# Patient Record
Sex: Female | Born: 1965 | Race: White | Hispanic: No | Marital: Single | Smoking: Former smoker
Health system: Southern US, Community
[De-identification: ages and names within clinical notes are randomized; demographics above are authoritative.]

## PROBLEM LIST (undated history)

## (undated) DIAGNOSIS — F329 Major depressive disorder, single episode, unspecified: Secondary | ICD-10-CM

## (undated) DIAGNOSIS — K219 Gastro-esophageal reflux disease without esophagitis: Secondary | ICD-10-CM

## (undated) DIAGNOSIS — K861 Other chronic pancreatitis: Secondary | ICD-10-CM

## (undated) DIAGNOSIS — F32A Depression, unspecified: Secondary | ICD-10-CM

## (undated) DIAGNOSIS — Z789 Other specified health status: Secondary | ICD-10-CM

## (undated) HISTORY — PX: BREAST REDUCTION SURGERY: SHX8

## (undated) HISTORY — PX: SHOULDER SURGERY: SHX246

## (undated) HISTORY — DX: Major depressive disorder, single episode, unspecified: F32.9

## (undated) HISTORY — PX: REDUCTION MAMMAPLASTY: SUR839

## (undated) HISTORY — DX: Depression, unspecified: F32.A

## (undated) HISTORY — DX: Gastro-esophageal reflux disease without esophagitis: K21.9

## (undated) HISTORY — DX: Other chronic pancreatitis: K86.1

---

## 2016-01-26 ENCOUNTER — Encounter (HOSPITAL_COMMUNITY): Payer: Self-pay | Admitting: Internal Medicine

## 2016-01-26 ENCOUNTER — Inpatient Hospital Stay (HOSPITAL_COMMUNITY): Payer: Medicaid Other

## 2016-01-26 ENCOUNTER — Encounter: Payer: Self-pay | Admitting: Emergency Medicine

## 2016-01-26 ENCOUNTER — Inpatient Hospital Stay (HOSPITAL_COMMUNITY)
Admission: AD | Admit: 2016-01-26 | Discharge: 2016-01-30 | DRG: 439 | Disposition: A | Discharge status: Home / Self Care | Payer: Medicaid Other | Source: Other Acute Inpatient Hospital | Attending: Internal Medicine | Admitting: Internal Medicine

## 2016-01-26 ENCOUNTER — Emergency Department
Admission: EM | Admit: 2016-01-26 | Discharge: 2016-01-26 | DRG: 439 | Disposition: A | Discharge status: Other Acute Inpatient Hospital | Payer: Medicaid Other | Attending: Internal Medicine | Admitting: Internal Medicine

## 2016-01-26 ENCOUNTER — Emergency Department: Payer: Medicaid Other

## 2016-01-26 DIAGNOSIS — N3090 Cystitis, unspecified without hematuria: Secondary | ICD-10-CM

## 2016-01-26 DIAGNOSIS — K8050 Calculus of bile duct without cholangitis or cholecystitis without obstruction: Secondary | ICD-10-CM | POA: Insufficient documentation

## 2016-01-26 DIAGNOSIS — N3001 Acute cystitis with hematuria: Secondary | ICD-10-CM

## 2016-01-26 DIAGNOSIS — F17210 Nicotine dependence, cigarettes, uncomplicated: Secondary | ICD-10-CM | POA: Diagnosis present

## 2016-01-26 DIAGNOSIS — R7303 Prediabetes: Secondary | ICD-10-CM | POA: Diagnosis present

## 2016-01-26 DIAGNOSIS — K8689 Other specified diseases of pancreas: Secondary | ICD-10-CM

## 2016-01-26 DIAGNOSIS — R739 Hyperglycemia, unspecified: Secondary | ICD-10-CM | POA: Diagnosis present

## 2016-01-26 DIAGNOSIS — K861 Other chronic pancreatitis: Secondary | ICD-10-CM | POA: Diagnosis present

## 2016-01-26 DIAGNOSIS — R112 Nausea with vomiting, unspecified: Secondary | ICD-10-CM | POA: Diagnosis present

## 2016-01-26 DIAGNOSIS — D72829 Elevated white blood cell count, unspecified: Secondary | ICD-10-CM

## 2016-01-26 DIAGNOSIS — K859 Acute pancreatitis without necrosis or infection, unspecified: Principal | ICD-10-CM

## 2016-01-26 DIAGNOSIS — F17200 Nicotine dependence, unspecified, uncomplicated: Secondary | ICD-10-CM | POA: Diagnosis not present

## 2016-01-26 DIAGNOSIS — E872 Acidosis, unspecified: Secondary | ICD-10-CM | POA: Diagnosis present

## 2016-01-26 HISTORY — DX: Other specified health status: Z78.9

## 2016-01-26 LAB — COMPREHENSIVE METABOLIC PANEL
ALT: 16 U/L (ref 14–54)
AST: 14 U/L — ABNORMAL LOW (ref 15–41)
Albumin: 3.9 g/dL (ref 3.5–5.0)
Alkaline Phosphatase: 103 U/L (ref 38–126)
Anion gap: 11 (ref 5–15)
BUN: 16 mg/dL (ref 6–20)
CO2: 20 mmol/L — ABNORMAL LOW (ref 22–32)
Calcium: 9.8 mg/dL (ref 8.9–10.3)
Chloride: 105 mmol/L (ref 101–111)
Creatinine, Ser: 0.47 mg/dL (ref 0.44–1.00)
GFR calc Af Amer: >60 mL/min (ref >60)
GFR calc non Af Amer: >60 mL/min (ref >60)
Glucose, Bld: 122 mg/dL — ABNORMAL HIGH (ref 65–99)
Potassium: 3.5 mmol/L (ref 3.5–5.1)
Sodium: 136 mmol/L (ref 135–145)
Total Bilirubin: 1 mg/dL (ref 0.3–1.2)
Total Protein: 7.5 g/dL (ref 6.5–8.1)

## 2016-01-26 LAB — URINALYSIS COMPLETE WITH MICROSCOPIC (ARMC ONLY)
BILIRUBIN URINE: NEGATIVE
Glucose, UA: NEGATIVE mg/dL
LEUKOCYTES UA: NEGATIVE
Nitrite: POSITIVE — AB
PH: 5 (ref 5.0–8.0)
Protein, ur: 30 mg/dL — AB
SPECIFIC GRAVITY, URINE: 1.019 (ref 1.005–1.030)

## 2016-01-26 LAB — CBC
HCT: 43.2 % (ref 35.0–47.0)
Hemoglobin: 14.9 g/dL (ref 12.0–16.0)
MCH: 31.3 pg (ref 26.0–34.0)
MCHC: 34.4 g/dL (ref 32.0–36.0)
MCV: 90.9 fL (ref 80.0–100.0)
Platelets: 214 10*3/uL (ref 150–440)
RBC: 4.75 MIL/uL (ref 3.80–5.20)
RDW: 12.9 % (ref 11.5–14.5)
WBC: 16.2 10*3/uL — ABNORMAL HIGH (ref 3.6–11.0)

## 2016-01-26 LAB — LIPASE, BLOOD: LIPASE: 780 U/L — AB (ref 11–51)

## 2016-01-26 MED ORDER — DOCUSATE SODIUM 100 MG PO CAPS: 100.0000 mg | ORAL_CAPSULE | Freq: Two times a day (BID) | ORAL | Status: DC

## 2016-01-26 MED ORDER — ENOXAPARIN SODIUM 40 MG/0.4ML ~~LOC~~ SOLN: 40.0000 mg | SUBCUTANEOUS | Status: DC

## 2016-01-26 MED ORDER — ACETAMINOPHEN 325 MG PO TABS: 650.0000 mg | ORAL_TABLET | Freq: Four times a day (QID) | ORAL | Status: DC | PRN

## 2016-01-26 MED ORDER — SODIUM CHLORIDE 0.9 % IV SOLN: INTRAVENOUS | Status: DC

## 2016-01-26 MED ORDER — ONDANSETRON HCL 4 MG/2ML IJ SOLN
4.0000 mg | Freq: Once | INTRAMUSCULAR | Status: AC
  Administered 2016-01-26: 4 mg via INTRAVENOUS

## 2016-01-26 MED ORDER — HYDROCODONE-ACETAMINOPHEN 5-325 MG PO TABS
1.0000 | ORAL_TABLET | ORAL | Status: DC | PRN
  Administered 2016-01-28 – 2016-01-29 (×2): 2 via ORAL
  Filled 2016-01-26 (×4): qty 2

## 2016-01-26 MED ORDER — HYDROMORPHONE HCL 1 MG/ML IJ SOLN
0.5000 mg | INTRAMUSCULAR | Status: DC | PRN
  Administered 2016-01-26 – 2016-01-27 (×2): 0.5 mg via INTRAVENOUS
  Filled 2016-01-26 (×2): qty 1

## 2016-01-26 MED ORDER — DIATRIZOATE MEGLUMINE & SODIUM 66-10 % PO SOLN
ORAL | Status: AC
  Administered 2016-01-26: 30 mL
  Filled 2016-01-26: qty 30

## 2016-01-26 MED ORDER — ACETAMINOPHEN 650 MG RE SUPP: 650.0000 mg | Freq: Four times a day (QID) | RECTAL | Status: DC | PRN

## 2016-01-26 MED ORDER — ONDANSETRON HCL 4 MG/2ML IJ SOLN: 4.0000 mg | Freq: Four times a day (QID) | INTRAMUSCULAR | Status: DC | PRN

## 2016-01-26 MED ORDER — SODIUM CHLORIDE 0.9 % IV SOLN: INTRAVENOUS | Status: AC

## 2016-01-26 MED ORDER — ONDANSETRON HCL 4 MG/2ML IJ SOLN
INTRAMUSCULAR | Status: AC
  Administered 2016-01-26: 4 mg via INTRAVENOUS
  Filled 2016-01-26: qty 2

## 2016-01-26 MED ORDER — ONDANSETRON HCL 4 MG/2ML IJ SOLN
4.0000 mg | Freq: Four times a day (QID) | INTRAMUSCULAR | Status: DC | PRN
  Administered 2016-01-27 – 2016-01-28 (×4): 4 mg via INTRAVENOUS
  Filled 2016-01-26 (×4): qty 2

## 2016-01-26 MED ORDER — HYDROMORPHONE HCL 1 MG/ML IJ SOLN
1.0000 mg | INTRAMUSCULAR | Status: DC | PRN
  Administered 2016-01-26: 1 mg via INTRAVENOUS
  Filled 2016-01-26: qty 1

## 2016-01-26 MED ORDER — DEXTROSE 5 % IV SOLN
1.0000 g | Freq: Once | INTRAVENOUS | Status: AC
  Administered 2016-01-26: 1 g via INTRAVENOUS
  Filled 2016-01-26: qty 10

## 2016-01-26 MED ORDER — ONDANSETRON HCL 4 MG PO TABS
4.0000 mg | ORAL_TABLET | Freq: Four times a day (QID) | ORAL | Status: DC | PRN
  Administered 2016-01-29: 4 mg via ORAL
  Filled 2016-01-26: qty 1

## 2016-01-26 MED ORDER — HYDROMORPHONE HCL 1 MG/ML IJ SOLN
INTRAMUSCULAR | Status: AC
  Administered 2016-01-26: 1 mg via INTRAVENOUS
  Filled 2016-01-26: qty 1

## 2016-01-26 MED ORDER — SODIUM CHLORIDE 0.9 % IV SOLN
INTRAVENOUS | Status: DC
  Administered 2016-01-26: 18:00:00 via INTRAVENOUS

## 2016-01-26 MED ORDER — ONDANSETRON HCL 4 MG PO TABS: 4.0000 mg | ORAL_TABLET | Freq: Four times a day (QID) | ORAL | Status: DC | PRN

## 2016-01-26 MED ORDER — SODIUM CHLORIDE 0.9 % IV BOLUS (SEPSIS)
1000.0000 mL | Freq: Once | INTRAVENOUS | Status: AC
  Administered 2016-01-26: 1000 mL via INTRAVENOUS

## 2016-01-26 MED ORDER — SODIUM CHLORIDE 0.9 % IV BOLUS (SEPSIS): 500.0000 mL | Freq: Once | INTRAVENOUS | Status: DC

## 2016-01-26 MED ORDER — HYDROCODONE-ACETAMINOPHEN 5-325 MG PO TABS: 1.0000 | ORAL_TABLET | ORAL | Status: DC | PRN

## 2016-01-26 MED ORDER — PIPERACILLIN-TAZOBACTAM 3.375 G IVPB
3.3750 g | Freq: Three times a day (TID) | INTRAVENOUS | Status: DC
  Administered 2016-01-26 – 2016-01-30 (×10): 3.375 g via INTRAVENOUS
  Filled 2016-01-26 (×14): qty 50

## 2016-01-26 MED ORDER — MORPHINE SULFATE (PF) 4 MG/ML IV SOLN: 4.0000 mg | INTRAVENOUS | Status: DC | PRN

## 2016-01-26 MED ORDER — HYDROMORPHONE HCL 1 MG/ML IJ SOLN
1.0000 mg | Freq: Once | INTRAMUSCULAR | Status: AC
  Administered 2016-01-26: 1 mg via INTRAVENOUS

## 2016-01-26 NOTE — H&P (Signed)
History and Physical    Anita Poole ZOX:[id number] DOB: [birth year] DOA: 01/26/2016  PCP: No PCP Per Patient The patient reports that she does not have a PCP or a gastroenterologist.  Patient coming from: [facility]  Chief Complaint: Nausea, vomiting, abdominal pain  HPI: Anita Poole is a 50 y.o. woman with active tobacco use but no other pertinent medical history who feels that she was in her baseline state of health until Wednesday evening.  She developed nausea and vomiting after working all day.  She denies any new or unusual exposures.  The emesis was nonbloody.  She subsequently developed epigastric and RUQ pain that eventually radiated into her back.  She initially thought that it was musculoskeletal and related to her active vomiting.  However, the pain intensified over the past 24 hours, refractory to prn Advil, and she ultimately presented to the ED at [facility] for evaluation.  She has not had fever, chills, or sweats.  No loss of consciousness.  She denies EtOH use.  She denies any LUTS.  ED Course: The patient has a leukocytosis, mild acidosis, and an elevated lipase level to 780.  RUQ ultrasound shows a pancreatic duct stone.  Of note, gallbladder appearance is unremarkable.  She also has an abnormal U/A (positive nitrites, many bacteria).  She received IV Rocephin.  She has been transferred to [facility] because she will need GI evaluation.  Review of Systems: As per HPI otherwise 10 point review of systems negative.    Past Medical History  Diagnosis Date  . Medical history non-contributory     Past Surgical History  Procedure Laterality Date  . Shoulder surgery Left   . Cesarean section    . Breast reduction surgery       reports that she has been smoking Cigarettes.  She has a 30 pack-year smoking history. She does not have any smokeless tobacco history on file. She reports that she does not drink alcohol or use illicit drugs.  She is not married.  She has  one son.  She is a Production designer, theatre/television/film at [employer].  No Known Allergies  Family History  Problem Relation Age of Onset  . Diabetes Mother   . Kidney failure Mother   . Hypertension Mother   . Coronary artery disease Mother   . Lung cancer Father      Prior to Admission medications   Medication Sig Start Date End Date Taking? Authorizing Provider  ibuprofen (ADVIL,MOTRIN) 200 MG tablet Take 400 mg by mouth every 6 (six) hours as needed for headache, mild pain or moderate pain.    Historical Provider, MD    Physical Exam: Filed Vitals:   01/26/16 2024  BP: 119/60  Pulse: 64  Temp: 98.5 F (36.9 C)  TempSrc: Oral  Resp: 19  SpO2: 96%      Constitutional: NAD, calm, comfortable Filed Vitals:   01/26/16 2024  BP: 119/60  Pulse: 64  Temp: 98.5 F (36.9 C)  TempSrc: Oral  Resp: 19  SpO2: 96%   Eyes: PERRL, lids and conjunctivae normal ENMT: Mucous membranes are slightly dry. Posterior pharynx clear of any exudate or lesions. Normal dentition.  Neck: normal appearance, supple, no masses Respiratory: clear to auscultation bilaterally, no wheezing, no crackles. Normal respiratory effort. No accessory muscle use.  Cardiovascular: Normal rate, regular rhythm, no murmurs / rubs / gallops. No extremity edema. 2+ pedal pulses. GI: abdomen is soft and compressible.  No distention.  She has epigastric and RUQ tenderness with mild  guarding.    Bowel sounds are hypoactive.   Musculoskeletal:  No joint deformity in upper and lower extremities. Good ROM, no contractures. Normal muscle tone.  Skin: no rashes, warm and dry Neurologic: CN 2-12 grossly intact. Sensation intact, Strength symmetric bilaterally, 5/5  Psychiatric: Normal judgment and insight. Alert and oriented x 3. Normal mood.     Labs on Admission: I have personally reviewed following labs and imaging studies  CBC:  Recent Labs Lab 01/26/16 1253  WBC 16.2*  HGB 14.9  HCT 43.2  MCV 90.9  PLT 214   Basic Metabolic  Panel:  Recent Labs Lab 01/26/16 1253  NA 136  K 3.5  CL 105  CO2 20*  GLUCOSE 122*  BUN 16  CREATININE 0.47  CALCIUM 9.8   GFR: Estimated Creatinine Clearance: 74 mL/min (by C-G formula based on Cr of 0.47). Liver Function Tests:  Recent Labs Lab 01/26/16 1253  AST 14*  ALT 16  ALKPHOS 103  BILITOT 1.0  PROT 7.5  ALBUMIN 3.9    Recent Labs Lab 01/26/16 1253  LIPASE 780*   Urine analysis:    Component Value Date/Time   COLORURINE YELLOW* 01/26/2016 1253   APPEARANCEUR CLEAR* 01/26/2016 1253   LABSPEC 1.019 01/26/2016 1253   PHURINE 5.0 01/26/2016 1253   GLUCOSEU NEGATIVE 01/26/2016 1253   HGBUR 3+* 01/26/2016 1253   BILIRUBINUR NEGATIVE 01/26/2016 1253   KETONESUR 2+* 01/26/2016 1253   PROTEINUR 30* 01/26/2016 1253   NITRITE POSITIVE* 01/26/2016 1253   LEUKOCYTESUR NEGATIVE 01/26/2016 1253   Radiological Exams on Admission: [place] Abdomen Limited Ruq  01/26/2016  CLINICAL DATA:  Right upper quadrant abdominal pain, elevated lipase EXAM: US ABDOMEN LIMITED - RIGHT UPPER QUADRANT COMPARISON:  None. FINDINGS: Gallbladder: No gallstones, gallbladder wall thickening, or pericholecystic fluid. Common bile duct: Diameter: 6 mm Liver: No focal lesion identified. Within normal limits in parenchymal echogenicity. Additional comments: Dilated pancreatic duct, measuring 9 mm, with suspected calculi within the duct measuring up to 8 mm . IMPRESSION: Dilated pancreatic duct with ductal calculi measuring up to 8 mm. Otherwise negative right upper quadrant ultrasound. Electronically Signed   By: Charline BillsSriyesh  Krishnan M.D.   On: 01/26/2016 17:15     Assessment/Plan Principal Problem:   Acute pancreatitis Active Problems:   Acidosis   Leukocytosis   Hyperglycemia   Acute cystitis with hematuria   Choledocholithiasis      Acute pancreatitis with pancreatic duct stones --GI consult pending.  Dr. Matthias HughsBuccini to see the patient tonight and discuss with his colleagues, but the  patient may actually need a tertiary referral center for appropriate intervention. --CT A/P pending --NPO --Aggressive IV fluids resuscitation per discussion with GI --Empiric IV zosyn --Repeat lipase in the AM  UTI, asymptomatic --Already on Empiric Zosyn --Urine culture pending --No signs of sepsis at this point  Active tobacco use --Smoking cessation counseling provided   DVT prophylaxis: SCDs. Early ambulation Code Status: FULL Family Communication: Patient alone at the time of admission. Disposition Plan: She will be here at least two midnights. Consults called: [facility] Admission status: Inpatient, med surg   TIME SPENT: 65 minutes   Jerene Bearsarter,Fowler Antos Harrison MD [facility] Pager [phone]  If 7PM-7AM, please contact night-coverage [email or web address] Password [email or web address]  01/26/2016, 9:09 PM

## 2016-01-26 NOTE — ED Notes (Signed)
Attempted to call report prior to transport, but [facility]. This RN gave [facility]  ED ascom number for call-back.

## 2016-01-26 NOTE — ED Provider Notes (Signed)
[facility] Emergency Department Provider Note  ____________________________________________  Time seen: 2:55 PM  I have reviewed the triage vital signs and the nursing notes.   HISTORY  Chief Complaint Emesis    HPI Anita Poole is a 50 y.o. female who complains of severe epigastric pain worse with eating or drinking any fluids for the past 2 days. No fever chills or sweats. No chest pain or shortness of breath. Never had anything like this before. Gallstones. Does not drink. No recent steroids, no trauma. Pain is severe sharp radiates to the back. No alleviating factors. Not positional. Constant, waxing and waning over the past 2 days.     History reviewed. No pertinent past medical history.   Patient Active Problem List   Diagnosis Date Noted  . Acute pancreatitis 01/26/2016  . Acidosis 01/26/2016  . Leukocytosis 01/26/2016  . Hyperglycemia 01/26/2016  . Acute cystitis with hematuria 01/26/2016     History reviewed. No pertinent past surgical history.   Current Outpatient Rx  Name  Route  Sig  Dispense  Refill  . ibuprofen (ADVIL,MOTRIN) 200 MG tablet   Oral   Take 400 mg by mouth every 6 (six) hours as needed for headache, mild pain or moderate pain.            Allergies Review of patient's allergies indicates no known allergies.   History reviewed. No pertinent family history.  Social History Social History  Substance Use Topics  . Smoking status: Current Every Day Smoker  . Smokeless tobacco: None  . Alcohol Use: No    Review of Systems  Constitutional:   No fever or chills.  ENT:   No sore throat. No rhinorrhea. Cardiovascular:   No chest pain. Respiratory:   No dyspnea or cough. Gastrointestinal:   Positive abdominal pain with vomiting..  Genitourinary:   Positive dysuria. Musculoskeletal:   Negative for focal pain or swelling Neurological:   Negative for headaches 10-point ROS otherwise  negative.  ____________________________________________   PHYSICAL EXAM:  VITAL SIGNS: ED Triage Vitals  Enc Vitals Group     BP 01/26/16 1303 130/74 mmHg     Pulse Rate 01/26/16 1303 82     Resp 01/26/16 1303 22     Temp --      Temp src --      SpO2 01/26/16 1303 99 %     Weight 01/26/16 1303 150 lb (68.04 kg)     Height 01/26/16 1303  (1.651 m)     Head Cir --      Peak Flow --      Pain Score 01/26/16 1245 6     Pain Loc --      Pain Edu? --      Excl. in GC? --     Vital signs reviewed, nursing assessments reviewed.   Constitutional:   Alert and oriented. Uncomfortable. Eyes:   No scleral icterus. No conjunctival pallor. PERRL. EOMI.  No nystagmus. ENT   Head:   Normocephalic and atraumatic.   Nose:   No congestion/rhinnorhea. No septal hematoma   Mouth/Throat:   Dry mucous membranes, no pharyngeal erythema. No peritonsillar mass.    Neck:   No stridor. No SubQ emphysema. No meningismus. Hematological/Lymphatic/Immunilogical:   No cervical lymphadenopathy. Cardiovascular:   RRR. Symmetric bilateral radial and DP pulses.  No murmurs.  Respiratory:   Normal respiratory effort without tachypnea nor retractions. Breath sounds are clear and equal bilaterally. No wheezes/rales/rhonchi. Gastrointestinal:   Soft with severe  epigastric tenderness and mild right upper quadrant tenderness. Negative Murphy. Positive suprapubic tenderness. Non distended. There is no CVA tenderness.  No rebound, rigidity, or guarding. Genitourinary:   deferred Musculoskeletal:   Nontender with normal range of motion in all extremities. No joint effusions.  No lower extremity tenderness.  No edema. Neurologic:   Normal speech and language.  CN 2-10 normal. Motor grossly intact. No gross focal neurologic deficits are appreciated.  Skin:    Skin is warm, dry and intact. No rash noted.  No petechiae, purpura, or bullae.  ____________________________________________    LABS  (pertinent positives/negatives) (all labs ordered are listed, but only abnormal results are displayed) Labs Reviewed  LIPASE, BLOOD - Abnormal; Notable for the following:    Lipase 780 (*)    All other components within normal limits  COMPREHENSIVE METABOLIC PANEL - Abnormal; Notable for the following:    CO2 20 (*)    Glucose, Bld 122 (*)    AST 14 (*)    All other components within normal limits  CBC - Abnormal; Notable for the following:    WBC 16.2 (*)    All other components within normal limits  URINALYSIS COMPLETEWITH MICROSCOPIC ([facility] ONLY) - Abnormal; Notable for the following:    Color, Urine YELLOW (*)    APPearance CLEAR (*)    Ketones, ur 2+ (*)    Hgb urine dipstick 3+ (*)    Protein, ur 30 (*)    Nitrite POSITIVE (*)    Bacteria, UA MANY (*)    Squamous Epithelial / LPF 0-5 (*)    All other components within normal limits  URINE CULTURE   ____________________________________________   EKG    ____________________________________________    RADIOLOGY  Ultrasound right upper quadrant reveals dilated pancreatic duct with notable ductal calculi measuring up to 8 mm. No evidence of cholecystitis.  ____________________________________________   PROCEDURES CRITICAL CARE Performed by: Scotty CourtSTAFFORD, Leon Goodnow   Total critical care time: 35 minutes  Critical care time was exclusive of separately billable procedures and treating other patients.  Critical care was necessary to treat or prevent imminent or life-threatening deterioration.  Critical care was time spent personally by me on the following activities: development of treatment plan with patient and/or surrogate as well as nursing, discussions with consultants, evaluation of patient's response to treatment, examination of patient, obtaining history from patient or surrogate, ordering and performing treatments and interventions, ordering and review of laboratory studies, ordering and review of radiographic  studies, pulse oximetry and re-evaluation of patient's condition.   ____________________________________________   INITIAL IMPRESSION / ASSESSMENT AND PLAN / ED COURSE  Pertinent labs & imaging results that were available during my care of the patient were reviewed by me and considered in my medical decision making (see chart for details).  Patient presents with severe epigastric pain with tenderness. Lipase is elevated at 760. Ultrasound reveals pancreatic ductal calculi without cholecystitis. Discussed with gastroenterology on-call Dr. Mechele CollinElliott who reports that this will require advanced ERCP beyond the capability of this facility. Recommends transfer to Medical Center. No evidence of cholecystitis or cholangitis at this time, ceftriaxone given for cystitis.     ____________________________________________   FINAL CLINICAL IMPRESSION(S) / ED DIAGNOSES  Final diagnoses:  Acute pancreatitis, unspecified pancreatitis type  Cystitis       Portions of this note were generated with dragon dictation software. Dictation errors may occur despite best attempts at proofreading.   Sharman CheekPhillip Shaquia Berkley, MD 01/26/16 2031

## 2016-01-26 NOTE — Progress Notes (Signed)
Pharmacy Antibiotic Note  Anita Poole is a 50 y.o. female admitted on 01/26/2016 with pancreatitis.  Pharmacy has been consulted for Zosyn dosing.  WBC 16, afebrile, Cr 0.47  Plan: Zosyn 3.375GM q8h EI     Temp (24hrs), Avg:98.5 F (36.9 C), Min:98.5 F (36.9 C), Max:98.5 F (36.9 C)   Recent Labs Lab 01/26/16 1253  WBC 16.2*  CREATININE 0.47    Estimated Creatinine Clearance: 74 mL/min (by C-G formula based on Cr of 0.47).    No Known Allergies  Anita Poole Pharm.D. CPP, BCPS Clinical Pharmacist [phone] 01/26/2016 9:25 PM   c

## 2016-01-26 NOTE — ED Notes (Signed)
Patient transported to Ultrasound 

## 2016-01-26 NOTE — ED Notes (Signed)
Report given to Gerda DissFaye RN at [phone] at [facility].

## 2016-01-26 NOTE — Plan of Care (Signed)
Called by [facility] for Ms Mazbough  [age]year old female with acute pancreatitis and UTI. Hemodynamically stable. Lipase 780 US abd showed dilated pancreatic duct with ductal calculi. Will need ERCP, [facility].   Accepted patient to med surg. Will need GI consult on arrival.    RAI,RIPUDEEP M.D. [facility] 01/26/2016, 5:53 PM  Pager: [phone]   .

## 2016-01-26 NOTE — Consult Note (Signed)
[facility] [place] [facility]   PATIENT NAME: Anita Poole    MR#:  [id number]  DATE OF BIRTH:  September 02, 1965  DATE OF ADMISSION:  01/26/2016  PRIMARY CARE PHYSICIAN: No PCP Per Patient   REQUESTING/REFERRING PHYSICIAN: Dr. Scotty CourtStafford  CHIEF COMPLAINT:   Chief Complaint  Patient presents with  . Emesis    HISTORY OF PRESENT ILLNESS:  Anita Poole  is a 50 y.o. female with no significant past medical history who presents to the hospital with complaints of nausea, vomiting, upper abdominal pain. Patient tells me that she was feeling well up until Wednesday, 3 days ago when she started having upper abdominal pain and frequent nausea and vomiting. Patient's pain was described as intermittent, sharp, as well as achy coming in paroxysms. It was to come as high as 10 out of 10 by intensity that, but then it would improve to 3 out of 10 by intensity. It was come every 15-20 minutes, patient felt chilly, she lost approximately 6 pounds since about a week ago because of relentless nausea and vomiting and severe abdominal pain. She presented emergency room for further evaluation and treatment. In emergency room, she was noted to have elevated lipase. Ultrasound of right upper quadrant revealed dilated pancreatic duct with ductal calculi measuring up to 8 mm. Consultation from a primary doc was requested by emergency room physician.   PAST MEDICAL HISTORY:  History reviewed. No pertinent past medical history.  PAST SURGICAL HISTOIRY:  Breast reduction, C-section, left arm surgery with aplate and screws due to fracture.  SOCIAL HISTORY:   Social History  Substance Use Topics  . Smoking status: Current Every Day Smoker  . Smokeless tobacco: Not on file  . Alcohol Use: No    FAMILY HISTORY:  Family history significant for patient's father was died of lung cancer, he was smoker, mother had hypertension, diabetes DRUG ALLERGIES:  No Known Allergies  REVIEW OF  SYSTEMS:  CONSTITUTIONAL: No fever, fatigue or weakness.  EYES: No blurred or double vision.  EARS, NOSE, AND THROAT: No tinnitus or ear pain.  RESPIRATORY: No cough, shortness of breath, wheezing or hemoptysis.  CARDIOVASCULAR: No chest pain, orthopnea, edema.  GASTROINTESTINAL: Frequent nausea, vomiting, upper abdominal pain, mostly in the right upper quadrant, no hematemesis or hematochezia.  GENITOURINARY: No dysuria, hematuria.  ENDOCRINE: No polyuria, nocturia,  HEMATOLOGY: No anemia, easy bruising or bleeding SKIN: No rash or lesion. MUSCULOSKELETAL: No joint pain or arthritis.   NEUROLOGIC: No tingling, numbness, weakness.  PSYCHIATRY: No anxiety or depression.   MEDICATIONS AT HOME:   Prior to Admission medications   Medication Sig Start Date End Date Taking? Authorizing Provider  ibuprofen (ADVIL,MOTRIN) 200 MG tablet Take 400 mg by mouth every 6 (six) hours as needed for headache, mild pain or moderate pain.   Yes Historical Provider, MD      VITAL SIGNS:  Blood pressure 118/66, pulse 70, resp. rate 20, height 5\' 5"  (1.651 m), weight 68.04 kg (150 lb), SpO2 94 %.  PHYSICAL EXAMINATION:  GENERAL:  50 y.o.-year-old patient lying in the bed In moderate distress due to significant abdominal pain, grimacing, uncomfortable, restless in bed.  EYES: Pupils equal, round, reactive to light and accommodation. No scleral icterus. Extraocular muscles intact.  HEENT: Head atraumatic, normocephalic. Oropharynx and nasopharynx clear.  NECK:  Supple, no jugular venous distention. No thyroid enlargement, no tenderness.  LUNGS: Normal breath sounds bilaterally, no wheezing, rales,rhonchi or crepitation. No use of accessory muscles of respiration.  CARDIOVASCULAR: S1, S2 normal. No murmurs, rubs, or gallops.  ABDOMEN: Soft, diffusely tender mostly in right upper quadrant ,  no rebound, but voluntary guarding was noted in the upper abdomen, nondistended. Bowel sounds present, diminished  No  organomegaly or mass.  EXTREMITIES: No pedal edema, cyanosis, or clubbing.  NEUROLOGIC: Cranial nerves II through XII are intact. Muscle strength 5/5 in all extremities. Sensation intact. Gait not checked.  PSYCHIATRIC: The patient is alert and oriented x 3.  SKIN: No obvious rash, lesion, or ulcer.   LABORATORY PANEL:   CBC  Recent Labs Lab 01/26/16 1253  WBC 16.2*  HGB 14.9  HCT 43.2  PLT 214   ------------------------------------------------------------------------------------------------------------------  Chemistries   Recent Labs Lab 01/26/16 1253  NA 136  K 3.5  CL 105  CO2 20*  GLUCOSE 122*  BUN 16  CREATININE 0.47  CALCIUM 9.8  AST 14*  ALT 16  ALKPHOS 103  BILITOT 1.0   ------------------------------------------------------------------------------------------------------------------  Cardiac Enzymes No results for input(s): TROPONINI in the last 168 hours. ------------------------------------------------------------------------------------------------------------------  RADIOLOGY:  US Abdomen Limited Ruq  01/26/2016  CLINICAL DATA:  Right upper quadrant abdominal pain, elevated lipase EXAM: US ABDOMEN LIMITED - RIGHT UPPER QUADRANT COMPARISON:  None. FINDINGS: Gallbladder: No gallstones, gallbladder wall thickening, or pericholecystic fluid. Common bile duct: Diameter: 6 mm Liver: No focal lesion identified. Within normal limits in parenchymal echogenicity. Additional comments: Dilated pancreatic duct, measuring 9 mm, with suspected calculi within the duct measuring up to 8 mm . IMPRESSION: Dilated pancreatic duct with ductal calculi measuring up to 8 mm. Otherwise negative right upper quadrant ultrasound. Electronically Signed   By: Charline Bills M.D.   On: 01/26/2016 17:15    EKG:  No orders found for this or any previous visit.  IMPRESSION AND PLAN:    Principal Problem:   Acute pancreatitis Active Problems:   Acidosis   Acute cystitis with  hematuria   Leukocytosis   Hyperglycemia  #1. Acute gallstone pancreatitis, continue patient on IV fluids, pain medications, patient would benefit from tertiary care center evaluation for possible ERCP, unfortunately, unable to admit patient, due to absence of gastroenterologist who will perform ERCP in the nearest future.  #2. Acidosis, likely due to intravascular depletion. Continue IV fluids   #3. Acute cystitis with hematuria, patient is asymptomatic, get urinary cultures, initiate antibiotic therapy if cultures are positive, patient was given 1 dose of Rocephin in the emergency room #4. Leukocytosis, follow with therapy #5. Hyperglycemia, get hemoglobin A1c to rule out diabetes   #6. Tobacco abuse. Counseling, discussed this patient for approximately 3 minutes. Nicotine replacement therapy is going to be initiated while in the hospital      All the records are reviewed and case discussed with Consulting provider. Management plans discussed with the patient, family and they are in agreement.  CODE STAFULL CODE  TOTAL TIME TAKING CARE OF THIS PATIENT 50 minutes.    Katharina Caper M.D on 01/26/2016 at 5:22 PM  Between 7am to 6pm - Pager - [phone]  After 6pm go to [email or web address] - password [email or web address]  [facility]  Office  [phone]  CC: Primary care Physician: No PCP Per Patient

## 2016-01-26 NOTE — ED Notes (Signed)
Patient left via [facility]. 

## 2016-01-26 NOTE — Consult Note (Signed)
Referring Provider:   Dr. Michael Litter  Primary Care Physician:  No PCP Per Patient Primary Gastroenterologist:  None (unassigned) (transfer from [facility])  Reason for Consultation:  Pancreatitis, pancreatic ductal stones  HPI: Anita Poole is a 50 y.o. female with no past medical history of any significance, and specifically no GI history, nor any history of pancreatic problems, pancreatitis, or ethanol abuse, who was transferred from the [facility], where ostensibly no GI coverage is available, for management of pancreatitis.  The patient indicates that she had the abrupt onset of upper abdominal pain 3 days ago (Wednesday evening) after eating a fatty meal (ribs). Since then, the pain has intensified, and then plateaued to its current status. She has gotten good relief of the pain with narcotics both at [facility] and here. She has been unable to eat for the past several days; even drinking water would come right back up, although it doesn't sound as though she has had frank nausea and vomiting. The pain is localized primarily to the upper abdominal region and slightly to the right upper quadrant, and does radiate through to the back. No fevers, no chills, no brown urine.  An abdominal ultrasound at [facility] showed no gallstones or biliary ductal dilatation, but did show a dilated (9 mm) pancreatic duct with stones within it.  Labs are pertinent for white count of 16,200 with hemoglobin 14.9, lipase 780, liver chemistries normal, renal function normal.  The patient denies prodromal anorexia, weight loss, or abdominal pain.  The patient moved down here from [place] about 2 years ago. She does not have a regular physician or a primary gastroenterologist. She has not had a screening colonoscopy.   Past Medical History  Diagnosis Date  . Medical history non-contributory     Past Surgical History  Procedure Laterality Date  . Shoulder  surgery Left   . Cesarean section    . Breast reduction surgery      Prior to Admission medications   Medication Sig Start Date End Date Taking? Authorizing Provider  ibuprofen (ADVIL,MOTRIN) 200 MG tablet Take 400 mg by mouth every 6 (six) hours as needed for headache, mild pain or moderate pain.    Historical Provider, MD    Current Facility-Administered Medications  Medication Dose Route Frequency Provider Last Rate Last Dose  . 0.9 %  sodium chloride infusion   Intravenous Continuous Michael Litter, MD       Followed by  . [START ON 01/27/2016] 0.9 %  sodium chloride infusion   Intravenous Continuous Michael Litter, MD      . acetaminophen (TYLENOL) tablet 650 mg  650 mg Oral Q6H PRN Michael Litter, MD       Or  . acetaminophen (TYLENOL) suppository 650 mg  650 mg Rectal Q6H PRN Michael Litter, MD      . HYDROcodone-acetaminophen (NORCO/VICODIN) 5-325 MG per tablet 1-2 tablet  1-2 tablet Oral Q4H PRN Michael Litter, MD      . HYDROmorphone (DILAUDID) injection 0.5 mg  0.5 mg Intravenous Q3H PRN Michael Litter, MD   0.5 mg at 01/26/16 2057  . ondansetron (ZOFRAN) tablet 4 mg  4 mg Oral Q6H PRN Michael Litter, MD       Or  . ondansetron [facility]) injection 4 mg  4 mg Intravenous Q6H PRN Michael Litter, MD      . piperacillin-tazobactam (ZOSYN) IVPB 3.375 g  3.375 g Intravenous Q8H Michael Litter, MD      . sodium chloride 0.9 %  bolus 500 mL  500 mL Intravenous Once Michael LitterNikki Carter, MD        Allergies as of 01/26/2016  . (No Known Allergies)    Family History  Problem Relation Age of Onset  . Diabetes Mother   . Kidney failure Mother   . Hypertension Mother   . Coronary artery disease Mother   . Lung cancer Father     Social History   Social History  . Marital Status: Single    Spouse Name: N/A  . Number of Children: N/A  . Years of Education: N/A   Occupational History  . Not on file.   Social History Main Topics  . Smoking status: Current Every Day Smoker -- 1.00 packs/day for 30  years    Types: Cigarettes  . Smokeless tobacco: Not on file  . Alcohol Use: No  . Drug Use: No  . Sexual Activity: No   Other Topics Concern  . Not on file   Social History Narrative    Review of Systems: Occasional headaches, otherwise negative. No prodromal anorexia, weight loss, or abdominal pain. No chest pain, no cough or shortness of breath. No urinary symptoms. No lymph node enlargement or arthritic complaints.  Physical Exam: Vital signs in last 24 hours: Temp:  [98.5 F (36.9 C)] 98.5 F (36.9 C) (07/08 2024) Pulse Rate:  [64-82] 64 (07/08 2024) Resp:  [16-22] 19 (07/08 2024) BP: (105-138)/(60-89) 119/60 mmHg (07/08 2024) SpO2:  [94 %-100 %] 96 % (07/08 2024) Weight:  [68.04 kg (150 lb)] 68.04 kg (150 lb) (07/08 1303)   General:   Alert,  Well-developed, well-nourished, pleasant and cooperative in remarkably NAD Head:  Normocephalic and atraumatic. Eyes:  Sclera clear, no icterus.   Conjunctiva pink. Mouth:   No ulcerations or lesions.  Oropharynx pink & moist. Neck:   No masses or thyromegaly. Lungs:  Clear throughout to auscultation.   No wheezes, crackles, or rhonchi. No evident respiratory distress. Heart:   Regular rate and rhythm; no murmurs, clicks, rubs,  or gallops. Abdomen:   Nondistended, sparse bowel sounds are present. Moderate upper abdominal tenderness, without rigidity or rebound or severe guarding. Msk:   Symmetrical without gross deformities. Extremities:   Without clubbing, cyanosis, or edema. Neurologic:  Alert and coherent;  grossly normal neurologically. Skin:  Intact without significant lesions or rashes. Cervical Nodes:  No significant cervical adenopathy. Psych:   Alert and cooperative. Normal mood and affect.  Intake/Output from previous day:   Intake/Output this shift:    Lab Results:  Recent Labs  01/26/16 1253  WBC 16.2*  HGB 14.9  HCT 43.2  PLT 214   BMET  Recent Labs  01/26/16 1253  NA 136  K 3.5  CL 105  CO2 20*   GLUCOSE 122*  BUN 16  CREATININE 0.47  CALCIUM 9.8   LFT  Recent Labs  01/26/16 1253  PROT 7.5  ALBUMIN 3.9  AST 14*  ALT 16  ALKPHOS 103  BILITOT 1.0   PT/INR No results for input(s): LABPROT, INR in the last 72 hours.  Studies/Results: [place] Abdomen Limited Ruq  01/26/2016  CLINICAL DATA:  Right upper quadrant abdominal pain, elevated lipase EXAM: US ABDOMEN LIMITED - RIGHT UPPER QUADRANT COMPARISON:  None. FINDINGS: Gallbladder: No gallstones, gallbladder wall thickening, or pericholecystic fluid. Common bile duct: Diameter: 6 mm Liver: No focal lesion identified. Within normal limits in parenchymal echogenicity. Additional comments: Dilated pancreatic duct, measuring 9 mm, with suspected calculi within the duct measuring up to  8 mm . IMPRESSION: Dilated pancreatic duct with ductal calculi measuring up to 8 mm. Otherwise negative right upper quadrant ultrasound. Electronically Signed   By: Charline Bills M.D.   On: 01/26/2016 17:15    Impression: 1. Acute pancreatitis by clinical and biochemical parameters 2. Ultrasonographic evidence of pancreatic ductal dilatation and pancreatic ductal stones. This would imply a fairly high likelihood of a pancreatic ductal stricture, the most common cause of which would be chronic pancreatitis, for which this patient has neither risk factors nor appropriate clinical history. 3. No evidence of cholelithiasis or elevated liver chemistries to suggest gallstone pancreatitis. 4. Candidate for colon cancer screening, slightly overdue  Plan: 1. For tonight, symptomatic management with aggressive IV fluids (300 ML's per hour), pain medication, nothing by mouth. 2. Agree with plan for CT scan tonight to further characterize the pancreas. 3. Reassess tomorrow. If the patient is getting worse clinically or biochemically, consider emergent pancreatic stent placement; otherwise, consider elective referral to a tertiary care Medical Center for more  advanced endoscopic interventions on the pancreas, which might include pancreatic sphincterotomy, stone extraction with, if necessary, extracorporeal shockwave lithotripsy of the pancreatic ductal stones, and stent placement. 4. The above plan has been discussed both with the patient and the attending hospitalist physician.   LOS: 0 days   Jairus Tonne V  01/26/2016, 9:57 PM   Pager [phone] If no answer or after 5 PM call [phone]

## 2016-01-26 NOTE — ED Notes (Signed)
Pt to ed with c/o vomiting since Wednesday night.  Pt denies diarrhea.

## 2016-01-26 NOTE — ED Notes (Signed)
Awaiting CMP and lipase. CBC and UA reviewed. WBC 16.9. Awaiting room for MD eval.

## 2016-01-27 ENCOUNTER — Inpatient Hospital Stay (HOSPITAL_COMMUNITY): Payer: Medicaid Other

## 2016-01-27 DIAGNOSIS — K8500 Idiopathic acute pancreatitis without necrosis or infection: Secondary | ICD-10-CM

## 2016-01-27 DIAGNOSIS — R739 Hyperglycemia, unspecified: Secondary | ICD-10-CM

## 2016-01-27 LAB — CBC
HCT: 36.9 % (ref 36.0–46.0)
Hemoglobin: 12.2 g/dL (ref 12.0–15.0)
MCH: 31.1 pg (ref 26.0–34.0)
MCHC: 33.1 g/dL (ref 30.0–36.0)
MCV: 94.1 fL (ref 78.0–100.0)
PLATELETS: 177 10*3/uL (ref 150–400)
RBC: 3.92 MIL/uL (ref 3.87–5.11)
RDW: 12.7 % (ref 11.5–15.5)
WBC: 12.6 10*3/uL — ABNORMAL HIGH (ref 4.0–10.5)

## 2016-01-27 LAB — APTT: APTT: 30 s (ref 24–37)

## 2016-01-27 LAB — COMPREHENSIVE METABOLIC PANEL
ALK PHOS: 82 U/L (ref 38–126)
ALT: 12 U/L — ABNORMAL LOW (ref 14–54)
ANION GAP: 6 (ref 5–15)
AST: 11 U/L — ABNORMAL LOW (ref 15–41)
Albumin: 2.8 g/dL — ABNORMAL LOW (ref 3.5–5.0)
BUN: 14 mg/dL (ref 6–20)
CALCIUM: 9.3 mg/dL (ref 8.9–10.3)
CHLORIDE: 107 mmol/L (ref 101–111)
CO2: 22 mmol/L (ref 22–32)
Creatinine, Ser: 0.58 mg/dL (ref 0.44–1.00)
GFR calc non Af Amer: >60 mL/min (ref >60)
Glucose, Bld: 94 mg/dL (ref 65–99)
Potassium: 3.3 mmol/L — ABNORMAL LOW (ref 3.5–5.1)
SODIUM: 135 mmol/L (ref 135–145)
Total Bilirubin: 0.7 mg/dL (ref 0.3–1.2)
Total Protein: 5.8 g/dL — ABNORMAL LOW (ref 6.5–8.1)

## 2016-01-27 LAB — LIPASE, BLOOD: Lipase: 337 U/L — ABNORMAL HIGH (ref 11–51)

## 2016-01-27 LAB — PROTIME-INR
INR: 1.26 (ref 0.00–1.49)
PROTHROMBIN TIME: 15.9 s — AB (ref 11.6–15.2)

## 2016-01-27 MED ORDER — KETOROLAC TROMETHAMINE 30 MG/ML IJ SOLN
30.0000 mg | Freq: Once | INTRAMUSCULAR | Status: AC
  Administered 2016-01-27: 30 mg via INTRAVENOUS
  Filled 2016-01-27: qty 1

## 2016-01-27 MED ORDER — POTASSIUM CHLORIDE IN NACL 40-0.9 MEQ/L-% IV SOLN
INTRAVENOUS | Status: DC
  Administered 2016-01-27 – 2016-01-28 (×4): 100 mL/h via INTRAVENOUS
  Filled 2016-01-27 (×12): qty 1000

## 2016-01-27 MED ORDER — NICOTINE 21 MG/24HR TD PT24
21.0000 mg | MEDICATED_PATCH | Freq: Every day | TRANSDERMAL | Status: DC
  Administered 2016-01-27 – 2016-01-30 (×4): 21 mg via TRANSDERMAL
  Filled 2016-01-27 (×4): qty 1

## 2016-01-27 MED ORDER — HYDROMORPHONE HCL 1 MG/ML IJ SOLN
1.0000 mg | INTRAMUSCULAR | Status: DC | PRN
Start: 2016-01-27 — End: 2016-01-30
  Administered 2016-01-27 – 2016-01-29 (×10): 1 mg via INTRAVENOUS
  Filled 2016-01-27 (×10): qty 1

## 2016-01-27 MED ORDER — IOPAMIDOL (ISOVUE-300) INJECTION 61%
INTRAVENOUS | Status: AC
  Administered 2016-01-27: 100 mL
  Filled 2016-01-27: qty 100

## 2016-01-27 NOTE — Discharge Summary (Addendum)
Physician Discharge Summary  Anita Poole MRN: [id number] DOB/AGE: 1966-02-16 50 y.o.  PCP: No PCP Per Patient   Admit date: 01/26/2016 Discharge date: 01/27/2016  Discharge Diagnoses:   Principal Problem:   Acute pancreatitis Active Problems:   Acidosis   Leukocytosis   Hyperglycemia   Acute cystitis with hematuria   Choledocholithiasis    Transfer recommendations Patient has been  accepted by the following physicians at the [facility] #1 Dr. branch-gastroenterology #2 Dr. Illene Labrador hospitalist    Medications at the time of transfer  Medication Dose/Rate, Route, Frequency Last Action    piperacillin-tazobactam (ZOSYN) IVPB 3.375 g 3.375 g, IV, Q8H Given: 07/09 0706    sodium chloride 0.9 % bolus 500 mL 500 mL, IV, Once Ordered         Continuous     Medication Dose/Rate, Route, Frequency Last Action    0.9 % sodium chloride infusion 150 mL/hr, IV, Continuous Ordered    0.9 % NaCl with KCl 40 mEq / L infusion 100 mL/hr, IV, Continuous Ordered         PRN     Medication Dose/Rate, Route, Frequency Last Action    acetaminophen (TYLENOL) suppository 650 mg 650 mg, RE, Q6H PRN Ordered    acetaminophen (TYLENOL) tablet 650 mg 650 mg, PO, Q6H PRN Ordered    HYDROcodone-acetaminophen (NORCO/VICODIN) 5-325 MG per tablet 1-2 tablet 1-2 tablet, PO, Q4H PRN Ordered    HYDROmorphone (DILAUDID) injection 1 mg 1 mg, IV, Q3H PRN Given: 07/09 1040    ondansetron (ZOFRAN) injection 4 mg 4 mg, IV, Q6H PRN Given: 07/09 1040    ondansetron (ZOFRAN) tablet 4 mg No Dose/Rate, PO, Q6H PRN See Alternative: 07/09 1040              Current Discharge Medication List    STOP taking these medications     ibuprofen (ADVIL,MOTRIN) 200 MG tablet          Discharge Condition: Stable   Discharge Instructions Get Medicines reviewed and adjusted: Please take all your medications with you for your next visit with your Primary MD  Please  request your Primary MD to go over all hospital tests and procedure/radiological results at the follow up, please ask your Primary MD to get all Hospital records sent to his/her office.  If you experience worsening of your admission symptoms, develop shortness of breath, life threatening emergency, suicidal or homicidal thoughts you must seek medical attention immediately by calling 911 or calling your MD immediately if symptoms less severe.  You must read complete instructions/literature along with all the possible adverse reactions/side effects for all the Medicines you take and that have been prescribed to you. Take any new Medicines after you have completely understood and accpet all the possible adverse reactions/side effects.   Do not drive when taking Pain medications.   Do not take more than prescribed Pain, Sleep and Anxiety Medications  Special Instructions: If you have smoked or chewed Tobacco in the last 2 yrs please stop smoking, stop any regular Alcohol and or any Recreational drug use.  Wear Seat belts while driving.  Please note  You were cared for by a hospitalist during your hospital stay. Once you are discharged, your primary care physician will handle any further medical issues. Please note that NO REFILLS for any discharge medications will be authorized once you are discharged, as it is imperative that you return to your primary care physician (or establish a relationship with a primary care physician if  you do not have one) for your aftercare needs so that they can reassess your need for medications and monitor your lab values.     No Known Allergies    [facility]   Consults:  Gastroenterology     Significant Diagnostic Studies:  Ct Abdomen Pelvis W Contrast  01/27/2016  CLINICAL DATA:  50 year old female inpatient with acute pancreatitis. EXAM: CT ABDOMEN AND PELVIS WITH CONTRAST TECHNIQUE: Multidetector CT imaging of the  abdomen and pelvis was performed using the standard protocol following bolus administration of intravenous contrast. CONTRAST:  171m ISOVUE-300 IOPAMIDOL (ISOVUE-300) INJECTION 61% COMPARISON:  01/26/2016 abdominal sonogram. FINDINGS: Lower chest: Right middle lobe 3 mm pulmonary nodule (series 3/ image 24). Centrilobular emphysema at both lung bases. Subsegmental platelike atelectasis in both lower lobes. Hepatobiliary: Normal liver size. Three scattered subcentimeter hypodense liver foci are too small to characterize. Normal gallbladder with no radiopaque cholelithiasis. No biliary ductal dilatation. Common bile duct diameter 5 mm. Pancreas: Main pancreatic duct is diffusely irregular and dilated with diameter 9 mm. There are coarse calcifications throughout the pancreatic head and neck, which are probably both parenchymal and intraductal including a dominant 13 mm calcification in the pancreatic head, which is probably located within the dorsal pancreatic duct. There is diffuse thickening of the pancreatic head and neck with associated prominent peripancreatic fat stranding. These findings are in keeping with acute on chronic pancreatitis. No focal peripancreatic fluid collection. No pancreatic parenchymal gas or areas of pancreatic parenchymal nonenhancement. No pancreatic mass. Spleen: Normal size. No mass. Adrenals/Urinary Tract: Normal adrenals. Subcentimeter hypodense renal cortical foci in both kidneys are too small to characterize. No hydronephrosis. Normal bladder. Stomach/Bowel: Grossly normal stomach. There is reactive wall thickening in the third and fourth portions of the duodenum. Otherwise normal caliber small bowel with no small bowel wall thickening. Normal appendix. No large bowel wall thickening, diverticulosis or pericolonic fat stranding. Moderate stool in the proximal colon. Vascular/Lymphatic: Atherosclerotic nonaneurysmal abdominal aorta. Patent portal, splenic, hepatic and renal veins. No  pathologically enlarged lymph nodes in the abdomen or pelvis. Reproductive: Grossly normal uterus.  No adnexal mass. Other: No pneumoperitoneum, ascites or focal fluid collection. Musculoskeletal: No aggressive appearing focal osseous lesions. Moderate degenerative changes in the visualized thoracolumbar spine. IMPRESSION: 1. Acute non-necrotizing pancreatitis of the pancreatic head and neck superimposed on chronic pancreatitis, with intraductal calcifications in the dilated irregular main pancreatic duct. No biliary ductal dilatation. 2. Reactive wall thickening in the distal duodenum. 3. Aortic atherosclerosis . 4. Right middle lobe 3 mm pulmonary nodule. No follow-up needed if patient is low-risk. Non-contrast chest CT can be considered in 12 months if patient is high-risk. This recommendation follows the consensus statement: Guidelines for Management of Incidental Pulmonary Nodules Detected on CT Images:From the Fleischner Society 2017; published online before print (10.1148/[id number]. 5. Centrilobular emphysema and atelectasis at the lung bases. Electronically Signed   By: JIlona SorrelM.D.   On: 01/27/2016 08:02   [place]dom[place] Limited Ruq  01/26/2016  CLINICAL DATA:  Right upper quadrant abdominal pain, elevated lipase EXAM: [place]DOM[place] LIMITED - RIGHT UPPER QUADRANT COMPARISON:  None. FINDINGS: Gallbladder: No gallstones, gallbladder wall thickening, or pericholecystic fluid. Common bile duct: Diameter: 6 mm Liver: No focal lesion identified. Within normal limits in parenchymal echogenicity. Additional comments: Dilated pancreatic duct, measuring 9 mm, with suspected calculi within the duct measuring up to 8 mm . IMPRESSION: Dilated pancreatic duct with ductal calculi measuring up to 8 mm. Otherwise negative right upper quadrant ultrasound.  Electronically Signed   By: Julian Hy M.D.   On: 01/26/2016 17:15        Filed Weights   01/27/16 0400  Weight: 74 kg (163 lb 2.3 oz)      Microbiology: No results found for this or any previous visit (from the past 240 hour(s)).     Blood Culture No results found for: SDES, [place], CULT, REPTSTATUS    Labs: Results for orders placed or performed during the hospital encounter of 01/26/16 (from the past 48 hour(s))  CBC     Status: Abnormal   Collection Time: 01/27/16  3:57 AM  Result Value Ref Range   WBC 12.6 (H) 4.0 - 10.5 K/uL   RBC 3.92 3.87 - 5.11 MIL/uL   Hemoglobin 12.2 12.0 - 15.0 g/dL   HCT 36.9 36.0 - 46.0 %   MCV 94.1 78.0 - 100.0 fL   MCH 31.1 26.0 - 34.0 pg   MCHC 33.1 30.0 - 36.0 g/dL   RDW 12.7 11.5 - 15.5 %   Platelets 177 150 - 400 K/uL  Comprehensive metabolic panel     Status: Abnormal   Collection Time: 01/27/16  3:57 AM  Result Value Ref Range   Sodium 135 135 - 145 mmol/L   Potassium 3.3 (L) 3.5 - 5.1 mmol/L   Chloride 107 101 - 111 mmol/L   CO2 22 22 - 32 mmol/L   Glucose, Bld 94 65 - 99 mg/dL   BUN 14 6 - 20 mg/dL   Creatinine, Ser 0.58 0.44 - 1.00 mg/dL   Calcium 9.3 8.9 - 10.3 mg/dL   Total Protein 5.8 (L) 6.5 - 8.1 g/dL   Albumin 2.8 (L) 3.5 - 5.0 g/dL   AST 11 (L) 15 - 41 U/L   ALT 12 (L) 14 - 54 U/L   Alkaline Phosphatase 82 38 - 126 U/L   Total Bilirubin 0.7 0.3 - 1.2 mg/dL   GFR calc non Af Amer >60 >60 mL/min   GFR calc Af Amer >60 >60 mL/min    Comment: (NOTE) The eGFR has been calculated using the CKD EPI equation. This calculation has not been validated in all clinical situations. eGFR's persistently <60 mL/min signify possible Chronic Kidney Disease.    Anion gap 6 5 - 15  Protime-INR     Status: Abnormal   Collection Time: 01/27/16  3:57 AM  Result Value Ref Range   Prothrombin Time 15.9 (H) 11.6 - 15.2 seconds   INR 1.26 0.00 - 1.49  APTT     Status: None   Collection Time: 01/27/16  3:57 AM  Result Value Ref Range   aPTT 30 24 - 37 seconds  Lipase, blood     Status: Abnormal   Collection Time: 01/27/16  3:57 AM  Result Value Ref Range    Lipase 337 (H) 11 - 51 U/L     Lipid Panel  No results found for: CHOL, TRIG, HDL, CHOLHDL, VLDL, LDLCALC, LDLDIRECT   No results found for: HGBA1C   Lab Results  Component Value Date   CREATININE 0.58 01/27/2016     HPI :  50 y.o. female with no past medical history of any significance, and specifically no GI history, nor any history of pancreatic problems, pancreatitis, or ethanol abuse, who was transferred from the [facility], where ostensibly no GI coverage is available, for management of pancreatitis.  The patient indicates that she had the abrupt onset of upper abdominal pain 3 days ago (Wednesday evening) after  eating a fatty meal (ribs). Since then, the pain has intensified, and then plateaued to its current status. She has gotten good relief of the pain with narcotics both at [facility] and here. She has been unable to eat for the past several days; even drinking water would come right back up, although it doesn't sound as though she has had frank nausea and vomiting. The pain is localized primarily to the upper abdominal region and slightly to the right upper quadrant, and does radiate through to the back. No fevers, no chills, no brown urine.  An abdominal ultrasound at [facility] showed no gallstones or biliary ductal dilatation, but did show a dilated (9 mm) pancreatic duct with stones within it.  Labs are pertinent for white count of 16,200 with hemoglobin 14.9, lipase 780, liver chemistries normal, renal function normal.  The patient denies prodromal anorexia, weight loss, or abdominal pain.  The patient moved down here from [place] about 2 years ago. She does not have a regular physician or a primary gastroenterologist. She has not had a screening colonoscopy.  HOSPITAL COURSE:   Acute on chronic pancreatitis   Ultrasonographic evidence of pancreatic ductal dilatation and pancreatic ductal stones.  Probable increased likelihood of a  pancreatic ductal stricture, the most common cause of which would be chronic pancreatitis, for which this patient has neither risk factors nor appropriate clinical history.  No evidence of cholelithiasis or elevated liver chemistries to suggest gallstone pancreatitis.  CT abdomen and pelvis shows nonnecrotizing pancreatitis of the pancreatic head and intraductal calcifications with a dilated irregular main pancreatic duct measuring 9 mm, no biliary dilatation Patient evaluated by  gastroenterology. Gastroenterology recommends that patient would need pancreatic stent placement,referral to a tertiary care [facility] for more advanced endoscopic interventions on the pancreas, which might include pancreatic sphincterotomy, stone extraction with, if necessary, extracorporeal shockwave lithotripsy of the pancreatic ductal stones, and stent placement. This is based on Ronald Lobo, MD, recommendations Patient has been discussed with [place] is to transfer the patient when bed available    Discharge Exam:   Blood pressure 119/60, pulse 64, temperature 98.5 F (36.9 C), temperature source Oral, resp. rate 19, height '5\' 5"'$  (1.651 m), weight 74 kg (163 lb 2.3 oz), SpO2 96 %.   Heart: Regular rate and rhythm; no murmurs, clicks, rubs, or gallops. Abdomen: Nondistended, sparse bowel sounds are present. Moderate upper abdominal tenderness, without rigidity or rebound or severe guarding. Msk: Symmetrical without gross deformities. Extremities: Without clubbing, cyanosis, or edema. Neurologic: Alert and coherent; grossly normal neurologically. Skin: Intact without significant lesions or rashes. Cervical Nodes: No significant cervical adenopathy. Psych: Alert and cooperative. Normal mood and affect.     SignedReyne Dumas 01/27/2016, 12:47 PM        Time spent >45 mins

## 2016-01-27 NOTE — Progress Notes (Signed)
I stopped by pt's room c. 3 pm and discussed rationale for transfer; I am [facility]  At time of my visit, pt was somewhat uncomfortable, but enjoying a visit from a good friend.  We will sign off; call if we can be of further help with this patient.  Florencia Reasonsobert V. Quin Mathenia, M.D. Pager [phone] If no answer or after 5 PM call [phone]

## 2016-01-27 NOTE — Progress Notes (Signed)
CT scan shows acute pancreatitis, superimposed on chronic pancreatitis.  Blood work improved, with drop in lipase level from 780 last night to 337 this morning. Liver chemistries remained normal. White count has also improved, from 16,000 last night to 12,600 this morning.  I will round on the patient later this morning, but have spoken with her on the phone, and it sounds as though she is reasonably comfortable at this time.  Recommendation:  1.  In view of the patient's biochemical improvement, I do not think that there is urgency to do ERCP with pancreatic stenting today.  2. I would recommend hospital to hospital transfer to a regional tertiary care Medical Center that offers advanced pancreatic interventions, as described in my consult note yesterday.  Florencia Reasonsobert V. Jinnie Onley, M.D. Pager [phone] If no answer or after 5 PM call [phone]

## 2016-01-28 DIAGNOSIS — K852 Alcohol induced acute pancreatitis without necrosis or infection: Secondary | ICD-10-CM

## 2016-01-28 LAB — HEMOGLOBIN A1C
Hgb A1c MFr Bld: 5.7 % (ref 4.0–6.0)
Hgb A1c MFr Bld: 5.3 % (ref 4.8–5.6)
MEAN PLASMA GLUCOSE: 105 mg/dL

## 2016-01-28 LAB — COMPREHENSIVE METABOLIC PANEL
ALK PHOS: 103 U/L (ref 38–126)
ALT: 15 U/L (ref 14–54)
AST: 15 U/L (ref 15–41)
Albumin: 2.5 g/dL — ABNORMAL LOW (ref 3.5–5.0)
Anion gap: 7 (ref 5–15)
BUN: 14 mg/dL (ref 6–20)
CALCIUM: 9 mg/dL (ref 8.9–10.3)
CHLORIDE: 110 mmol/L (ref 101–111)
CO2: 20 mmol/L — ABNORMAL LOW (ref 22–32)
CREATININE: 0.62 mg/dL (ref 0.44–1.00)
GFR calc Af Amer: >60 mL/min (ref >60)
Glucose, Bld: 74 mg/dL (ref 65–99)
Potassium: 4.1 mmol/L (ref 3.5–5.1)
Sodium: 137 mmol/L (ref 135–145)
Total Bilirubin: 0.9 mg/dL (ref 0.3–1.2)
Total Protein: 5.3 g/dL — ABNORMAL LOW (ref 6.5–8.1)

## 2016-01-28 LAB — LIPASE, BLOOD: LIPASE: 45 U/L (ref 11–51)

## 2016-01-29 DIAGNOSIS — K8050 Calculus of bile duct without cholangitis or cholecystitis without obstruction: Secondary | ICD-10-CM

## 2016-01-29 LAB — CBC
HCT: 34.8 % — ABNORMAL LOW (ref 36.0–46.0)
Hemoglobin: 11.4 g/dL — ABNORMAL LOW (ref 12.0–15.0)
MCH: 30.6 pg (ref 26.0–34.0)
MCHC: 32.8 g/dL (ref 30.0–36.0)
MCV: 93.5 fL (ref 78.0–100.0)
PLATELETS: 171 10*3/uL (ref 150–400)
RBC: 3.72 MIL/uL — ABNORMAL LOW (ref 3.87–5.11)
RDW: 12.5 % (ref 11.5–15.5)
WBC: 8 10*3/uL (ref 4.0–10.5)

## 2016-01-29 LAB — URINE CULTURE: Culture: >=100000 — AB

## 2016-01-29 LAB — CANCER ANTIGEN 19-9: CA 19 9: 36 U/mL — AB (ref 0–35)

## 2016-01-29 MED ORDER — ENOXAPARIN SODIUM 40 MG/0.4ML ~~LOC~~ SOLN
40.0000 mg | SUBCUTANEOUS | Status: DC
  Filled 2016-01-29 (×2): qty 0.4

## 2016-01-29 NOTE — Progress Notes (Signed)
[facility] PROGRESS NOTE  Anita Poole ZOX:[id number] DOB: [birth year] DOA: 01/26/2016   PCP: No PCP Per Patient     Assessment/Plan: Principal Problem:   Acute pancreatitis Active Problems:   Acidosis   Leukocytosis   Hyperglycemia   Acute cystitis with hematuria   Choledocholithiasis   50 y.o. female with no past medical history of any significance, and specifically no GI history, nor any history of pancreatic problems, pancreatitis, or ethanol abuse, who was transferred from the [facility], where ostensibly no GI coverage is available, for management of pancreatitis.  The patient indicates that she had the abrupt onset of upper abdominal pain 3 days ago (Wednesday evening) after eating a fatty meal (ribs). Since then, the pain has intensified, and then plateaued to its current status. She has gotten good relief of the pain with narcotics both at [facility] and here. She has been unable to eat for the past several days; even drinking water would come right back up, although it doesn't sound as though she has had frank nausea and vomiting. The pain is localized primarily to the upper abdominal region and slightly to the right upper quadrant, and does radiate through to the back. No fevers, no chills, no brown urine.  An abdominal ultrasound at [facility] showed no gallstones or biliary ductal dilatation, but did show a dilated (9 mm) pancreatic duct with stones within it.  Labs are pertinent for white count of 16,200 with hemoglobin 14.9, lipase 780, liver chemistries normal, renal function normal.  The patient denies prodromal anorexia, weight loss, or abdominal pain.  The patient moved down here from [place] about 2 years ago. She does not have a regular physician or a primary gastroenterologist. She has not had a screening colonoscopy.  HOSPITAL COURSE:  Acute on chronic pancreatitis  Ultrasonographic evidence of pancreatic ductal  dilatation and pancreatic ductal stones. Probable increased likelihood of a pancreatic ductal stricture, the most common cause of which would be chronic pancreatitis, for which this patient has neither risk factors nor appropriate clinical history. No evidence of cholelithiasis or elevated liver chemistries to suggest gallstone pancreatitis. CT abdomen and pelvis shows nonnecrotizing pancreatitis of the pancreatic head and intraductal calcifications with a dilated irregular main pancreatic duct measuring 9 mm, no biliary dilatation Patient evaluated by gastroenterology. Gastroenterology recommends that patient would need pancreatic stent placement,referral to a tertiary care Medical Center for more advanced endoscopic interventions on the pancreas, which might include pancreatic sphincterotomy, stone extraction with, if necessary, extracorporeal shockwave lithotripsy of the pancreatic ductal stones, and stent placement. This is based on Bernette Redbird, MD, recommendations Patient has been discussed with [facility] Plan is to transfer the patient when bed available  Patient is progressing well, white count has come down from 16.2>12 >8.0, continue Zosyn   Patient complaining of hunger pain, extremely anxious to eat, started on clear liquid diet     DVT prophylaxsis SCDs  Code Status:  Full code    Family Communication: Discussed in detail with the patient, all imaging results, lab results explained to the patient   Disposition Plan:  Pending transfer to [facility]     Consultants:  Gastroenterology  Procedures:   None  Antibiotics: Anti-infectives    Start     Dose/Rate Route Frequency Ordered Stop   01/26/16 2200  piperacillin-tazobactam (ZOSYN) IVPB 3.375 g     3.375 g 12.5 mL/hr over 240 Minutes Intravenous Every 8 hours 01/26/16 2123  HPI/Subjective: Patient extremely anxious and wants to eat  Objective: Filed Vitals:   01/28/16 0642 01/28/16 1556  01/28/16 2221 01/29/16 0528  BP: 126/64 122/68 119/66 129/64  Pulse: 60 56 52 50  Temp: 98.8 F (37.1 C) 97.6 F (36.4 C) 98.6 F (37 C) 98.7 F (37.1 C)  TempSrc:  Oral Oral Oral  Resp: Height:      Weight:      SpO2: 95% 96% 97% 97%    Intake/Output Summary (Last 24 hours) at 01/29/16 1243 Last data filed at 01/29/16 0700  Gross per 24 hour  Intake 2481.67 ml  Output      0 ml  Net 2481.67 ml    Exam:  Examination:  General exam: Appears calm and comfortable  Respiratory system: Clear to auscultation. Respiratory effort normal. Cardiovascular system: S1 & S2 heard, RRR. No JVD, murmurs, rubs, gallops or clicks. No pedal edema. Gastrointestinal system: Abdomen is nondistended, soft and nontender. No organomegaly or masses felt. Normal bowel sounds heard. Central nervous system: Alert and oriented. No focal neurological deficits. Extremities: Symmetric 5 x 5 power. Skin: No rashes, lesions or ulcers Psychiatry: Judgement and insight appear normal. Mood & affect appropriate.     Data Reviewed: I have personally reviewed following labs and imaging studies  Micro Results Recent Results (from the past 240 hour(s))  Urine culture     Status: Abnormal   Collection Time: 01/26/16 12:53 PM  Result Value Ref Range Status   Specimen Description URINE, RANDOM  Final   Special Requests NONE  Final   Culture >=100,000 COLONIES/mL ESCHERICHIA COLI (A)  Final   Report Status 01/29/2016 FINAL  Final   Organism ID, Bacteria ESCHERICHIA COLI (A)  Final      Susceptibility   Escherichia coli - MIC*    AMPICILLIN <=2 SENSITIVE Sensitive     CEFAZOLIN <=4 SENSITIVE Sensitive     CEFTRIAXONE <=1 SENSITIVE Sensitive     CIPROFLOXACIN <=0.25 SENSITIVE Sensitive     GENTAMICIN 4 SENSITIVE Sensitive     IMIPENEM <=0.25 SENSITIVE Sensitive     NITROFURANTOIN <=16 SENSITIVE Sensitive     TRIMETH/SULFA <=20 SENSITIVE Sensitive     AMPICILLIN/SULBACTAM <=2 SENSITIVE  Sensitive     PIP/TAZO <=4 SENSITIVE Sensitive     Extended ESBL NEGATIVE Sensitive     * >=100,000 COLONIES/mL ESCHERICHIA COLI    Radiology Reports Ct Abdomen Pelvis W Contrast  01/27/2016  CLINICAL DATA:  50 year old female inpatient with acute pancreatitis. EXAM: CT ABDOMEN AND PELVIS WITH CONTRAST TECHNIQUE: Multidetector CT imaging of the abdomen and pelvis was performed using the standard protocol following bolus administration of intravenous contrast. CONTRAST:  ISOVUE-300 IOPAMIDOL (ISOVUE-300) INJECTION 61% COMPARISON:  01/26/2016 abdominal sonogram. FINDINGS: Lower chest: Right middle lobe 3 mm pulmonary nodule (series 3/ image 24). Centrilobular emphysema at both lung bases. Subsegmental platelike atelectasis in both lower lobes. Hepatobiliary: Normal liver size. Three scattered subcentimeter hypodense liver foci are too small to characterize. Normal gallbladder with no radiopaque cholelithiasis. No biliary ductal dilatation. Common bile duct diameter 5 mm. Pancreas: Main pancreatic duct is diffusely irregular and dilated with diameter 9 mm. There are coarse calcifications throughout the pancreatic head and neck, which are probably both parenchymal and intraductal including a dominant 13 mm calcification in the pancreatic head, which is probably located within the dorsal pancreatic duct. There is diffuse thickening of the pancreatic head and neck with associated prominent peripancreatic fat stranding. These findings are in keeping  with acute on chronic pancreatitis. No focal peripancreatic fluid collection. No pancreatic parenchymal gas or areas of pancreatic parenchymal nonenhancement. No pancreatic mass. Spleen: Normal size. No mass. Adrenals/Urinary Tract: Normal adrenals. Subcentimeter hypodense renal cortical foci in both kidneys are too small to characterize. No hydronephrosis. Normal bladder. Stomach/Bowel: Grossly normal stomach. There is reactive wall thickening in the third and  fourth portions of the duodenum. Otherwise normal caliber small bowel with no small bowel wall thickening. Normal appendix. No large bowel wall thickening, diverticulosis or pericolonic fat stranding. Moderate stool in the proximal colon. Vascular/Lymphatic: Atherosclerotic nonaneurysmal abdominal aorta. Patent portal, splenic, hepatic and renal veins. No pathologically enlarged lymph nodes in the abdomen or pelvis. Reproductive: Grossly normal uterus.  No adnexal mass. Other: No pneumoperitoneum, ascites or focal fluid collection. Musculoskeletal: No aggressive appearing focal osseous lesions. Moderate degenerative changes in the visualized thoracolumbar spine. IMPRESSION: 1. Acute non-necrotizing pancreatitis of the pancreatic head and neck superimposed on chronic pancreatitis, with intraductal calcifications in the dilated irregular main pancreatic duct. No biliary ductal dilatation. 2. Reactive wall thickening in the distal duodenum. 3. Aortic atherosclerosis . 4. Right middle lobe 3 mm pulmonary nodule. No follow-up needed if patient is low-risk. Non-contrast chest CT can be considered in 12 months if patient is high-risk. This recommendation follows the consensus statement: Guidelines for Management of Incidental Pulmonary Nodules Detected on CT Images:From the Fleischner Society 2017; published online before print (10.1148/[id number]). 5. Centrilobular emphysema and atelectasis at the lung bases. Electronically Signed   By: Delbert PhenixJason A Poff M.D.   On: 01/27/2016 08:02   [place] Abdomen Limited Ruq  01/26/2016  CLINICAL DATA:  Right upper quadrant abdominal pain, elevated lipase EXAM: US ABDOMEN LIMITED - RIGHT UPPER QUADRANT COMPARISON:  None. FINDINGS: Gallbladder: No gallstones, gallbladder wall thickening, or pericholecystic fluid. Common bile duct: Diameter: 6 mm Liver: No focal lesion identified. Within normal limits in parenchymal echogenicity. Additional comments: Dilated pancreatic duct, measuring 9  mm, with suspected calculi within the duct measuring up to 8 mm . IMPRESSION: Dilated pancreatic duct with ductal calculi measuring up to 8 mm. Otherwise negative right upper quadrant ultrasound. Electronically Signed   By: Charline BillsSriyesh  Krishnan M.D.   On: 01/26/2016 17:15     CBC  Recent Labs Lab 01/26/16 1253 01/27/16 0357 01/29/16 0452  WBC 16.2* 12.6* 8.0  HGB 14.9 12.2 11.4*  HCT 43.2 36.9 34.8*  PLT 214 177 171  MCV 90.9 94.1 93.5  MCH 31.3 31.1 30.6  MCHC 34.4 33.1 32.8  RDW 12.9 12.7 12.5    Chemistries   Recent Labs Lab 01/26/16 1253 01/27/16 0357 01/28/16 0853  NA 136 135 137  K 3.5 3.3* 4.1  CL 105 107 110  CO2 20* 22 20*  GLUCOSE 122* 94 74  BUN 16 14 14   CREATININE 0.47 0.58 0.62  CALCIUM 9.8 9.3 9.0  AST 14* 11* 15  ALT 16 12* 15  ALKPHOS 103 82 103  BILITOT 1.0 0.7 0.9   ------------------------------------------------------------------------------------------------------------------ estimated creatinine clearance is 82.9 mL/min (by C-G formula based on Cr of 0.62). ------------------------------------------------------------------------------------------------------------------  Recent Labs  01/26/16 1253 01/27/16 0401  HGBA1C 5.7 5.3   ------------------------------------------------------------------------------------------------------------------ No results for input(s): CHOL, HDL, LDLCALC, TRIG, CHOLHDL, LDLDIRECT in the last 72 hours. ------------------------------------------------------------------------------------------------------------------ No results for input(s): TSH, T4TOTAL, T3FREE, THYROIDAB in the last 72 hours.  Invalid input(s): FREET3 ------------------------------------------------------------------------------------------------------------------ No results for input(s): VITAMINB12, FOLATE, FERRITIN, TIBC, IRON, RETICCTPCT in the last 72 hours.  Coagulation profile  Recent Labs Lab 01/27/16 0357  INR 1.26    No  results for input(s): DDIMER in the last 72 hours.  Cardiac Enzymes No results for input(s): CKMB, TROPONINI, MYOGLOBIN in the last 168 hours.  Invalid input(s): CK ------------------------------------------------------------------------------------------------------------------ Invalid input(s): POCBNP   CBG: No results for input(s): GLUCAP in the last 168 hours.     Studies: No results found.    Lab Results  Component Value Date   HGBA1C 5.3 01/27/2016   HGBA1C 5.7 01/26/2016   Lab Results  Component Value Date   CREATININE 0.62 01/28/2016       Scheduled Meds: . enoxaparin (LOVENOX) injection  40 mg Subcutaneous Q24H  . nicotine  21 mg Transdermal Daily  . piperacillin-tazobactam (ZOSYN)  IV  3.375 g Intravenous Q8H  . sodium chloride  500 mL Intravenous Once   Continuous Infusions: . sodium chloride    . 0.9 % NaCl with KCl 40 mEq / L 100 mL/hr (01/28/16 2134)     LOS: 3 days    Time spent: >30 MINS    [facility]  [facility] Pager [phone]. If 7PM-7AM, please contact night-coverage at [email or web address], password [facility] 01/29/2016, 12:43 PM  LOS: 3 days

## 2016-01-29 NOTE — Progress Notes (Addendum)
[facility] PROGRESS NOTE  Anita Poole EXB:[id number] DOB: 1966-06-30 DOA: 01/26/2016   PCP: No PCP Per Patient     Assessment/Plan: Principal Problem:   Acute pancreatitis Active Problems:   Acidosis   Leukocytosis   Hyperglycemia   Acute cystitis with hematuria   Choledocholithiasis   50 y.o. female with no past medical history of any significance, and specifically no GI history, nor any history of pancreatic problems, pancreatitis, or ethanol abuse, who was transferred from the [facility], where ostensibly no GI coverage is available, for management of pancreatitis.  The patient indicates that she had the abrupt onset of upper abdominal pain 3 days ago (Wednesday evening) after eating a fatty meal (ribs). Since then, the pain has intensified, and then plateaued to its current status. She has gotten good relief of the pain with narcotics both at [facility] and here. She has been unable to eat for the past several days; even drinking water would come right back up, although it doesn't sound as though she has had frank nausea and vomiting. The pain is localized primarily to the upper abdominal region and slightly to the right upper quadrant, and does radiate through to the back. No fevers, no chills, no brown urine.  An abdominal ultrasound at [facility] showed no gallstones or biliary ductal dilatation, but did show a dilated (9 mm) pancreatic duct with stones within it.  Labs are pertinent for white count of 16,200 with hemoglobin 14.9, lipase 780, liver chemistries normal, renal function normal.  The patient denies prodromal anorexia, weight loss, or abdominal pain.  The patient moved down here from [place] about 2 years ago. She does not have a regular physician or a primary gastroenterologist. She has not had a screening colonoscopy.  HOSPITAL COURSE:  Acute on chronic pancreatitis  Ultrasonographic evidence of pancreatic ductal  dilatation and pancreatic ductal stones. Probable increased likelihood of a pancreatic ductal stricture, the most common cause of which would be chronic pancreatitis, for which this patient has neither risk factors nor appropriate clinical history. No evidence of cholelithiasis or elevated liver chemistries to suggest gallstone pancreatitis. CT abdomen and pelvis shows nonnecrotizing pancreatitis of the pancreatic head and intraductal calcifications with a dilated irregular main pancreatic duct measuring 9 mm, no biliary dilatation Patient evaluated by gastroenterology. Gastroenterology recommends that patient would need pancreatic stent placement,referral to a tertiary care Medical Center for more advanced endoscopic interventions on the pancreas, which might include pancreatic sphincterotomy, stone extraction with, if necessary, extracorporeal shockwave lithotripsy of the pancreatic ductal stones, and stent placement. This is based on Bernette Redbird, MD, recommendations Patient has been discussed with [facility] Plan is to transfer the patient when bed available  Patient is progressing well, white count has come down from 16.2>12 , continue Zosyn   Patient's pain is improving, requiring 1 mg of Dilaudid every 4-5 hours    DVT prophylaxsis SCDs  Code Status:  Full code    Family Communication: Discussed in detail with the patient, all imaging results, lab results explained to the patient   Disposition Plan:  Pending transfer to [facility]     Consultants:  Gastroenterology  Procedures:   None  Antibiotics: Anti-infectives    Start     Dose/Rate Route Frequency Ordered Stop   01/26/16 2200  piperacillin-tazobactam (ZOSYN) IVPB 3.375 g     3.375 g 12.5 mL/hr over 240 Minutes Intravenous Every 8 hours 01/26/16 2123  HPI/Subjective: Abdominal pain slowly improving,  Objective: Filed Vitals:   01/28/16 0642 01/28/16 1556 01/28/16 2221 01/29/16 0528  BP:  126/64 122/68 119/66 129/64  Pulse: 60 56 52 50  Temp: 98.8 F (37.1 C) 97.6 F (36.4 C) 98.6 F (37 C) 98.7 F (37.1 C)  TempSrc:  Oral Oral Oral  Resp: 17 17 18 18   Height:      Weight:      SpO2: 95% 96% 97% 97%    Intake/Output Summary (Last 24 hours) at 01/29/16 0957 Last data filed at 01/29/16 0700  Gross per 24 hour  Intake 2481.67 ml  Output      0 ml  Net 2481.67 ml    Exam:  Examination:  General exam: Appears calm and comfortable  Respiratory system: Clear to auscultation. Respiratory effort normal. Cardiovascular system: S1 & S2 heard, RRR. No JVD, murmurs, rubs, gallops or clicks. No pedal edema. Gastrointestinal system: Abdomen is nondistended, soft and nontender. No organomegaly or masses felt. Normal bowel sounds heard. Central nervous system: Alert and oriented. No focal neurological deficits. Extremities: Symmetric 5 x 5 power. Skin: No rashes, lesions or ulcers Psychiatry: Judgement and insight appear normal. Mood & affect appropriate.     Data Reviewed: I have personally reviewed following labs and imaging studies  Micro Results Recent Results (from the past 240 hour(s))  Urine culture     Status: Abnormal   Collection Time: 01/26/16 12:53 PM  Result Value Ref Range Status   Specimen Description URINE, RANDOM  Final   Special Requests NONE  Final   Culture >=100,000 COLONIES/mL ESCHERICHIA COLI (A)  Final   Report Status 01/29/2016 FINAL  Final   Organism ID, Bacteria ESCHERICHIA COLI (A)  Final      Susceptibility   Escherichia coli - MIC*    AMPICILLIN <=2 SENSITIVE Sensitive     CEFAZOLIN <=4 SENSITIVE Sensitive     CEFTRIAXONE <=1 SENSITIVE Sensitive     CIPROFLOXACIN <=0.25 SENSITIVE Sensitive     GENTAMICIN 4 SENSITIVE Sensitive     IMIPENEM <=0.25 SENSITIVE Sensitive     NITROFURANTOIN <=16 SENSITIVE Sensitive     TRIMETH/SULFA <=20 SENSITIVE Sensitive     AMPICILLIN/SULBACTAM <=2 SENSITIVE Sensitive     PIP/TAZO <=4 SENSITIVE  Sensitive     Extended ESBL NEGATIVE Sensitive     * >=100,000 COLONIES/mL ESCHERICHIA COLI    Radiology Reports Ct Abdomen Pelvis W Contrast  01/27/2016  CLINICAL DATA:  50 year old female inpatient with acute pancreatitis. EXAM: CT ABDOMEN AND PELVIS WITH CONTRAST TECHNIQUE: Multidetector CT imaging of the abdomen and pelvis was performed using the standard protocol following bolus administration of intravenous contrast. CONTRAST:  100mL ISOVUE-300 IOPAMIDOL (ISOVUE-300) INJECTION 61% COMPARISON:  01/26/2016 abdominal sonogram. FINDINGS: Lower chest: Right middle lobe 3 mm pulmonary nodule (series 3/ image 24). Centrilobular emphysema at both lung bases. Subsegmental platelike atelectasis in both lower lobes. Hepatobiliary: Normal liver size. Three scattered subcentimeter hypodense liver foci are too small to characterize. Normal gallbladder with no radiopaque cholelithiasis. No biliary ductal dilatation. Common bile duct diameter 5 mm. Pancreas: Main pancreatic duct is diffusely irregular and dilated with diameter 9 mm. There are coarse calcifications throughout the pancreatic head and neck, which are probably both parenchymal and intraductal including a dominant 13 mm calcification in the pancreatic head, which is probably located within the dorsal pancreatic duct. There is diffuse thickening of the pancreatic head and neck with associated prominent peripancreatic fat stranding. These findings are in keeping with acute on  chronic pancreatitis. No focal peripancreatic fluid collection. No pancreatic parenchymal gas or areas of pancreatic parenchymal nonenhancement. No pancreatic mass. Spleen: Normal size. No mass. Adrenals/Urinary Tract: Normal adrenals. Subcentimeter hypodense renal cortical foci in both kidneys are too small to characterize. No hydronephrosis. Normal bladder. Stomach/Bowel: Grossly normal stomach. There is reactive wall thickening in the third and fourth portions of the duodenum.  Otherwise normal caliber small bowel with no small bowel wall thickening. Normal appendix. No large bowel wall thickening, diverticulosis or pericolonic fat stranding. Moderate stool in the proximal colon. Vascular/Lymphatic: Atherosclerotic nonaneurysmal abdominal aorta. Patent portal, splenic, hepatic and renal veins. No pathologically enlarged lymph nodes in the abdomen or pelvis. Reproductive: Grossly normal uterus.  No adnexal mass. Other: No pneumoperitoneum, ascites or focal fluid collection. Musculoskeletal: No aggressive appearing focal osseous lesions. Moderate degenerative changes in the visualized thoracolumbar spine. IMPRESSION: 1. Acute non-necrotizing pancreatitis of the pancreatic head and neck superimposed on chronic pancreatitis, with intraductal calcifications in the dilated irregular main pancreatic duct. No biliary ductal dilatation. 2. Reactive wall thickening in the distal duodenum. 3. Aortic atherosclerosis . 4. Right middle lobe 3 mm pulmonary nodule. No follow-up needed if patient is low-risk. Non-contrast chest CT can be considered in 12 months if patient is high-risk. This recommendation follows the consensus statement: Guidelines for Management of Incidental Pulmonary Nodules Detected on CT Images:From the Fleischner Society 2017; published online before print (10.1148/radiol.[id number]). 5. Centrilobular emphysema and atelectasis at the lung bases. Electronically Signed   By: Delbert Phenix M.D.   On: 01/27/2016 08:02   US Abdomen Limited Ruq  01/26/2016  CLINICAL DATA:  Right upper quadrant abdominal pain, elevated lipase EXAM: US ABDOMEN LIMITED - RIGHT UPPER QUADRANT COMPARISON:  None. FINDINGS: Gallbladder: No gallstones, gallbladder wall thickening, or pericholecystic fluid. Common bile duct: Diameter: 6 mm Liver: No focal lesion identified. Within normal limits in parenchymal echogenicity. Additional comments: Dilated pancreatic duct, measuring 9 mm, with suspected calculi within  the duct measuring up to 8 mm . IMPRESSION: Dilated pancreatic duct with ductal calculi measuring up to 8 mm. Otherwise negative right upper quadrant ultrasound. Electronically Signed   By: Charline Bills M.D.   On: 01/26/2016 17:15     CBC  Recent Labs Lab 01/26/16 1253 01/27/16 0357 01/29/16 0452  WBC 16.2* 12.6* 8.0  HGB 14.9 12.2 11.4*  HCT 43.2 36.9 34.8*  PLT 214 177 171  MCV 90.9 94.1 93.5  MCH 31.3 31.1 30.6  MCHC 34.4 33.1 32.8  RDW 12.9 12.7 12.5    Chemistries   Recent Labs Lab 01/26/16 1253 01/27/16 0357 01/28/16 0853  NA 136 135 137  K 3.5 3.3* 4.1  CL 105 107 110  CO2 20* 22 20*  GLUCOSE 122* 94 74  BUN CREATININE 0.47 0.58 0.62  CALCIUM 9.8 9.3 9.0  AST 14* 11* 15  ALT 16 12* 15  ALKPHOS 103 82 103  BILITOT 1.0 0.7 0.9   ------------------------------------------------------------------------------------------------------------------ estimated creatinine clearance is 82.9 mL/min (by C-G formula based on Cr of 0.62). ------------------------------------------------------------------------------------------------------------------  Recent Labs  01/26/16 1253 01/27/16 0401  HGBA1C 5.7 5.3   ------------------------------------------------------------------------------------------------------------------ No results for input(s): CHOL, HDL, LDLCALC, TRIG, CHOLHDL, LDLDIRECT in the last 72 hours. ------------------------------------------------------------------------------------------------------------------ No results for input(s): TSH, T4TOTAL, T3FREE, THYROIDAB in the last 72 hours.  Invalid input(s): FREET3 ------------------------------------------------------------------------------------------------------------------ No results for input(s): VITAMINB12, FOLATE, FERRITIN, TIBC, IRON, RETICCTPCT in the last 72 hours.  Coagulation profile  Recent Labs Lab 01/27/16 0357  INR 1.26  No results for input(s): DDIMER in the  last 72 hours.  Cardiac Enzymes No results for input(s): CKMB, TROPONINI, MYOGLOBIN in the last 168 hours.  Invalid input(s): CK ------------------------------------------------------------------------------------------------------------------ Invalid input(s): POCBNP   CBG: No results for input(s): GLUCAP in the last 168 hours.     Studies: No results found.    Lab Results  Component Value Date   HGBA1C 5.3 01/27/2016   HGBA1C 5.7 01/26/2016   Lab Results  Component Value Date   CREATININE 0.62 01/28/2016       Scheduled Meds: . nicotine  21 mg Transdermal Daily  . piperacillin-tazobactam (ZOSYN)  IV  3.375 g Intravenous Q8H  . sodium chloride  500 mL Intravenous Once   Continuous Infusions: . sodium chloride    . 0.9 % NaCl with KCl 40 mEq / L 100 mL/hr (01/28/16 2134)     LOS: 3 days    Time spent: >30 MINS    [facility]  [facility] Pager [phone]. If 7PM-7AM, please contact night-coverage at [email or web address], password [facility] 01/29/2016, 9:57 AM  LOS: 3 days

## 2016-01-30 MED ORDER — NICOTINE 21 MG/24HR TD PT24: 21.0000 mg | MEDICATED_PATCH | Freq: Every day | TRANSDERMAL | Status: DC

## 2016-01-30 MED ORDER — PANCRELIPASE (LIP-PROT-AMYL) 12000-38000 UNITS PO CPEP: 24000.0000 [IU] | ORAL_CAPSULE | Freq: Three times a day (TID) | ORAL | Status: DC

## 2016-01-30 MED ORDER — HYDROCODONE-ACETAMINOPHEN 5-325 MG PO TABS: 1.0000 | ORAL_TABLET | Freq: Four times a day (QID) | ORAL | Status: DC | PRN

## 2016-01-30 MED ORDER — PROMETHAZINE HCL 12.5 MG PO TABS: 12.5000 mg | ORAL_TABLET | Freq: Four times a day (QID) | ORAL | Status: DC | PRN

## 2016-01-30 NOTE — Care Management Note (Signed)
Case Management Note  Patient Details  Name: Anita Poole MRN: [id number] Date of Birth: [birth year]  Subjective/Objective:                    Action/Plan:  Called Dr Buccini's office spoke with [name] to schedule a follow up appointment . Amber took information , her office will review patient and call her tomorrow with appointment . Confirmed with patient her phone number is [phone]. Patient is aware and also has their number in case she does not receive a call.  Consult for PCP . [facility] accepting new [facility]'s they are currently closed for lunch , patient has their contact information and will call to schedule an appointment .   MATCH letter provided and explained patient aware pain medication is not covered .  Patient voiced understanding of all of the above.   Expected Discharge Date:                  Expected Discharge Plan:  Home/Self Care  In-House Referral:     Discharge planning Services  CM Consult, Medication Assistance, [facility] Program, Indigent Health Clinic  Post Acute Care Choice:    Choice offered to:  Patient  DME Arranged:    DME Agency:     HH Arranged:    HH Agency:     Status of Service:  Completed, signed off  If discussed at [employer] Meetings, dates discussed:    Additional Comments:  Kingsley PlanWile, Helina Hullum Marie, RN 01/30/2016, 12:47 PM

## 2016-01-30 NOTE — Discharge Summary (Signed)
Physician Discharge Summary  Linnet Bottari MRN: [id number] DOB/AGE: [birth year] 50 y.o.  PCP: No PCP Per Patient   Admit date: 01/26/2016 Discharge date: 01/30/2016  Discharge Diagnoses:   Principal Problem:   Acute pancreatitis Active Problems:   Acidosis   Leukocytosis   Hyperglycemia   Acute cystitis with hematuria   Choledocholithiasis    Transfer recommendations Patient has been  accepted by the following physicians at the [facility] #1 Dr. branch-gastroenterology #2 Dr. Derry Skill hospitalist    Patient was awaiting transfer at the time of discharge. She would need follow-up with Dr. Wyline Mood for further evaluation of a pancreatic duct    Medication List    STOP taking these medications        ibuprofen 200 MG tablet  Commonly known as:  ADVIL,MOTRIN      TAKE these medications        HYDROcodone-acetaminophen 5-325 MG tablet  Commonly known as:  NORCO/VICODIN  Take 1-2 tablets by mouth every 6 (six) hours as needed for moderate pain.     lipase/protease/amylase 46962 units Cpep capsule  Commonly known as:  CREON  Take 2 capsules (24,000 Units total) by mouth 3 (three) times daily before meals.     nicotine 21 mg/24hr patch  Commonly known as:  NICODERM CQ - dosed in mg/24 hours  Place 1 patch (21 mg total) onto the skin daily.           Discharge Condition: Stable   Discharge Instructions Get Medicines reviewed and adjusted: Please take all your medications with you for your next visit with your Primary MD  Please request your Primary MD to go over all hospital tests and procedure/radiological results at the follow up, please ask your Primary MD to get all Hospital records sent to his/her office.  If you experience worsening of your admission symptoms, develop shortness of breath, life threatening emergency, suicidal or homicidal thoughts you must seek medical attention immediately by calling 911 or calling your MD immediately if symptoms  less severe.  You must read complete instructions/literature along with all the possible adverse reactions/side effects for all the Medicines you take and that have been prescribed to you. Take any new Medicines after you have completely understood and accpet all the possible adverse reactions/side effects.   Do not drive when taking Pain medications.   Do not take more than prescribed Pain, Sleep and Anxiety Medications  Special Instructions: If you have smoked or chewed Tobacco in the last 2 yrs please stop smoking, stop any regular Alcohol and or any Recreational drug use.  Wear Seat belts while driving.  Please note  You were cared for by a hospitalist during your hospital stay. Once you are discharged, your primary care physician will handle any further medical issues. Please note that NO REFILLS for any discharge medications will be authorized once you are discharged, as it is imperative that you return to your primary care physician (or establish a relationship with a primary care physician if you do not have one) for your aftercare needs so that they can reassess your need for medications and monitor your lab values.     No Known Allergies    [facility]   Consults:  Gastroenterology     Significant Diagnostic Studies:  Ct Abdomen Pelvis W Contrast  01/27/2016  CLINICAL DATA:  50 year old female inpatient with acute pancreatitis. EXAM: CT ABDOMEN AND PELVIS WITH CONTRAST TECHNIQUE: Multidetector CT imaging of the abdomen and pelvis was performed  using the standard protocol following bolus administration of intravenous contrast. CONTRAST:  100mL ISOVUE-300 IOPAMIDOL (ISOVUE-300) INJECTION 61% COMPARISON:  01/26/2016 abdominal sonogram. FINDINGS: Lower chest: Right middle lobe 3 mm pulmonary nodule (series 3/ image 24). Centrilobular emphysema at both lung bases. Subsegmental platelike atelectasis in both lower lobes. Hepatobiliary: Normal liver  size. Three scattered subcentimeter hypodense liver foci are too small to characterize. Normal gallbladder with no radiopaque cholelithiasis. No biliary ductal dilatation. Common bile duct diameter 5 mm. Pancreas: Main pancreatic duct is diffusely irregular and dilated with diameter 9 mm. There are coarse calcifications throughout the pancreatic head and neck, which are probably both parenchymal and intraductal including a dominant 13 mm calcification in the pancreatic head, which is probably located within the dorsal pancreatic duct. There is diffuse thickening of the pancreatic head and neck with associated prominent peripancreatic fat stranding. These findings are in keeping with acute on chronic pancreatitis. No focal peripancreatic fluid collection. No pancreatic parenchymal gas or areas of pancreatic parenchymal nonenhancement. No pancreatic mass. Spleen: Normal size. No mass. Adrenals/Urinary Tract: Normal adrenals. Subcentimeter hypodense renal cortical foci in both kidneys are too small to characterize. No hydronephrosis. Normal bladder. Stomach/Bowel: Grossly normal stomach. There is reactive wall thickening in the third and fourth portions of the duodenum. Otherwise normal caliber small bowel with no small bowel wall thickening. Normal appendix. No large bowel wall thickening, diverticulosis or pericolonic fat stranding. Moderate stool in the proximal colon. Vascular/Lymphatic: Atherosclerotic nonaneurysmal abdominal aorta. Patent portal, splenic, hepatic and renal veins. No pathologically enlarged lymph nodes in the abdomen or pelvis. Reproductive: Grossly normal uterus.  No adnexal mass. Other: No pneumoperitoneum, ascites or focal fluid collection. Musculoskeletal: No aggressive appearing focal osseous lesions. Moderate degenerative changes in the visualized thoracolumbar spine. IMPRESSION: 1. Acute non-necrotizing pancreatitis of the pancreatic head and neck superimposed on chronic pancreatitis, with  intraductal calcifications in the dilated irregular main pancreatic duct. No biliary ductal dilatation. 2. Reactive wall thickening in the distal duodenum. 3. Aortic atherosclerosis . 4. Right middle lobe 3 mm pulmonary nodule. No follow-up needed if patient is low-risk. Non-contrast chest CT can be considered in 12 months if patient is high-risk. This recommendation follows the consensus statement: Guidelines for Management of Incidental Pulmonary Nodules Detected on CT Images:From the Fleischner Society 2017; published online before print (10.1148/[id number]). 5. Centrilobular emphysema and atelectasis at the lung bases. Electronically Signed   By: Delbert PhenixJason A Poff M.D.   On: 01/27/2016 08:02   [place] Abdomen Limited Ruq  01/26/2016  CLINICAL DATA:  Right upper quadrant abdominal pain, elevated lipase EXAM: US ABDOMEN LIMITED - RIGHT UPPER QUADRANT COMPARISON:  None. FINDINGS: Gallbladder: No gallstones, gallbladder wall thickening, or pericholecystic fluid. Common bile duct: Diameter: 6 mm Liver: No focal lesion identified. Within normal limits in parenchymal echogenicity. Additional comments: Dilated pancreatic duct, measuring 9 mm, with suspected calculi within the duct measuring up to 8 mm . IMPRESSION: Dilated pancreatic duct with ductal calculi measuring up to 8 mm. Otherwise negative right upper quadrant ultrasound. Electronically Signed   By: Charline BillsSriyesh  Krishnan M.D.   On: 01/26/2016 17:15        Filed Weights   01/27/16 0400  Weight: 74 kg (163 lb 2.3 oz)     Microbiology: Recent Results (from the past 240 hour(s))  Urine culture     Status: Abnormal   Collection Time: 01/26/16 12:53 PM  Result Value Ref Range Status   Specimen Description URINE, RANDOM  Final   Special Requests NONE  Final  Culture >=100,000 COLONIES/mL ESCHERICHIA COLI (A)  Final   Report Status 01/29/2016 FINAL  Final   Organism ID, Bacteria ESCHERICHIA COLI (A)  Final      Susceptibility   Escherichia coli -  MIC*    AMPICILLIN <=2 SENSITIVE Sensitive     CEFAZOLIN <=4 SENSITIVE Sensitive     CEFTRIAXONE <=1 SENSITIVE Sensitive     CIPROFLOXACIN <=0.25 SENSITIVE Sensitive     GENTAMICIN 4 SENSITIVE Sensitive     IMIPENEM <=0.25 SENSITIVE Sensitive     NITROFURANTOIN <=16 SENSITIVE Sensitive     TRIMETH/SULFA <=20 SENSITIVE Sensitive     AMPICILLIN/SULBACTAM <=2 SENSITIVE Sensitive     PIP/TAZO <=4 SENSITIVE Sensitive     Extended ESBL NEGATIVE Sensitive     * >=100,000 COLONIES/mL ESCHERICHIA COLI       Blood Culture    Component Value Date/Time   SDES URINE, RANDOM 01/26/2016 1253   SPECREQUEST NONE 01/26/2016 1253   CULT >=100,000 COLONIES/mL ESCHERICHIA COLI* 01/26/2016 1253   REPTSTATUS 01/29/2016 FINAL 01/26/2016 1253      Labs: Results for orders placed or performed during the hospital encounter of 01/26/16 (from the past 48 hour(s))  CBC     Status: Abnormal   Collection Time: 01/29/16  4:52 AM  Result Value Ref Range   WBC 8.0 4.0 - 10.5 K/uL   RBC 3.72 (L) 3.87 - 5.11 MIL/uL   Hemoglobin 11.4 (L) 12.0 - 15.0 g/dL   HCT 40.1 (L) 02.7 - 25.3 %   MCV 93.5 78.0 - 100.0 fL   MCH 30.6 26.0 - 34.0 pg   MCHC 32.8 30.0 - 36.0 g/dL   RDW 66.4 40.3 - 47.4 %   Platelets 171 150 - 400 K/uL     Lipid Panel  No results found for: CHOL, TRIG, HDL, CHOLHDL, VLDL, LDLCALC, LDLDIRECT   Lab Results  Component Value Date   HGBA1C 5.3 01/27/2016   HGBA1C 5.7 01/26/2016        HPI :  50 y.o. female with no past medical history of any significance, and specifically no GI history, nor any history of pancreatic problems, pancreatitis, or ethanol abuse, who was transferred from the [facility], where ostensibly no GI coverage is available, for management of pancreatitis.  The patient indicates that she had the abrupt onset of upper abdominal pain 3 days ago (Wednesday evening) after eating a fatty meal (ribs). Since then, the pain has intensified, and then  plateaued to its current status. She has gotten good relief of the pain with narcotics both at [facility] and here. She has been unable to eat for the past several days; even drinking water would come right back up, although it doesn't sound as though she has had frank nausea and vomiting. The pain is localized primarily to the upper abdominal region and slightly to the right upper quadrant, and does radiate through to the back. No fevers, no chills, no brown urine.  An abdominal ultrasound at [facility] showed no gallstones or biliary ductal dilatation, but did show a dilated (9 mm) pancreatic duct with stones within it.  Labs are pertinent for white count of 16,200 with hemoglobin 14.9, lipase 780, liver chemistries normal, renal function normal.  The patient denies prodromal anorexia, weight loss, or abdominal pain.  The patient moved down here from [place] about 2 years ago. She does not have a regular physician or a primary gastroenterologist. She has not had a screening colonoscopy.  HOSPITAL COURSE:   Acute  on chronic pancreatitis   Ultrasonographic evidence of pancreatic ductal dilatation and pancreatic ductal stones.  Probable increased likelihood of a pancreatic ductal stricture, the most common cause of which would be chronic pancreatitis, for which this patient has neither risk factors nor appropriate clinical history.  No evidence of cholelithiasis or elevated liver chemistries to suggest gallstone pancreatitis.  CT abdomen and pelvis shows nonnecrotizing pancreatitis of the pancreatic head and intraductal calcifications with a dilated irregular main pancreatic duct measuring 9 mm, no biliary dilatation Patient evaluated by  gastroenterology. Gastroenterology recommends that patient would need pancreatic stent placement,referral to a tertiary care Medical Center for more advanced endoscopic interventions on the pancreas, which might include pancreatic sphincterotomy, stone  extraction with, if necessary, extracorporeal shockwave lithotripsy of the pancreatic ductal stones, and stent placement. This is based on Bernette Redbird, MD, recommendations Patient has been discussed with [facility] however patient did not get transferred for 3 days now and is anxious to go home. Her lipase has improved, white count has normalized, she is eating and wants to be discharged Referral to see Dr. Wyline Mood in [facility] has been provided  UTI-Escherichia coli on urine culture Patient has been treated with Zosyn since 7/8, will not continue any further antibiotics at this time     Discharge Exam:   Blood pressure 125/63, pulse 52, temperature 97.8 F (36.6 C), temperature source Oral, resp. rate 18, height  (1.651 m), weight 74 kg (163 lb 2.3 oz), SpO2 97 %.   Heart: Regular rate and rhythm; no murmurs, clicks, rubs, or gallops. Abdomen: Nondistended, sparse bowel sounds are present. Moderate upper abdominal tenderness, without rigidity or rebound or severe guarding. Msk: Symmetrical without gross deformities. Extremities: Without clubbing, cyanosis, or edema. Neurologic: Alert and coherent; grossly normal neurologically. Skin: Intact without significant lesions or rashes. Cervical Nodes: No significant cervical adenopathy. Psych: Alert and cooperative. Normal mood and affect.   Follow-up Information    Follow up with Gastroenterology. Schedule an appointment as soon as possible for a visit in 3 days.   Why:  Please call to make this appointment, referral provided by Dr. Teryl Lucy information:      Dr. Marcille Blanco MD  Website  Directions    5.0 1 Google review   Loading...       Gastroenterologist in [place], [place]        Address: [address] [place], [place], [place] [address]    Phone: [phone]      Follow up with Primary care provider. Schedule an appointment as soon as possible for a visit in 3  days.   Why:  Hospital follow-up      Signed: Richarda Overlie 01/30/2016, 12:04 PM        Time spent >45 mins

## 2016-01-30 NOTE — Progress Notes (Signed)
Dr. Susie CassetteAbrol printed letter for work for pt. Pt. D/c'd successfully.

## 2016-01-30 NOTE — Progress Notes (Addendum)
Discharge papers gone over with pt. Prescription given to pt. No questions/complaints. IV taken out. Pt. Has ride on the way. Dr. Susie CassetteAbrol paged because pt. Has questions about when she should return to work. Pt. Refuses w/c at d/c.

## 2016-02-23 ENCOUNTER — Encounter (HOSPITAL_COMMUNITY): Payer: Self-pay

## 2016-02-23 ENCOUNTER — Emergency Department (HOSPITAL_COMMUNITY)
Admission: EM | Admit: 2016-02-23 | Discharge: 2016-02-23 | Disposition: A | Discharge status: Other Acute Inpatient Hospital | Payer: Medicaid Other | Attending: Emergency Medicine | Admitting: Emergency Medicine

## 2016-02-23 DIAGNOSIS — F17210 Nicotine dependence, cigarettes, uncomplicated: Secondary | ICD-10-CM | POA: Diagnosis not present

## 2016-02-23 DIAGNOSIS — K859 Acute pancreatitis without necrosis or infection, unspecified: Secondary | ICD-10-CM | POA: Insufficient documentation

## 2016-02-23 DIAGNOSIS — R1013 Epigastric pain: Secondary | ICD-10-CM | POA: Diagnosis present

## 2016-02-23 LAB — CBC
HEMATOCRIT: 40.3 % (ref 36.0–46.0)
Hemoglobin: 13.8 g/dL (ref 12.0–15.0)
MCH: 30.7 pg (ref 26.0–34.0)
MCHC: 34.2 g/dL (ref 30.0–36.0)
MCV: 89.8 fL (ref 78.0–100.0)
PLATELETS: 220 10*3/uL (ref 150–400)
RBC: 4.49 MIL/uL (ref 3.87–5.11)
RDW: 12.2 % (ref 11.5–15.5)
WBC: 7.5 10*3/uL (ref 4.0–10.5)

## 2016-02-23 LAB — URINE MICROSCOPIC-ADD ON

## 2016-02-23 LAB — COMPREHENSIVE METABOLIC PANEL
ALT: 12 U/L — ABNORMAL LOW (ref 14–54)
ANION GAP: 8 (ref 5–15)
AST: 12 U/L — ABNORMAL LOW (ref 15–41)
Albumin: 3.9 g/dL (ref 3.5–5.0)
Alkaline Phosphatase: 107 U/L (ref 38–126)
BUN: 9 mg/dL (ref 6–20)
CHLORIDE: 109 mmol/L (ref 101–111)
CO2: 21 mmol/L — ABNORMAL LOW (ref 22–32)
CREATININE: 0.59 mg/dL (ref 0.44–1.00)
Calcium: 10.3 mg/dL (ref 8.9–10.3)
Glucose, Bld: 101 mg/dL — ABNORMAL HIGH (ref 65–99)
POTASSIUM: 3.8 mmol/L (ref 3.5–5.1)
Sodium: 138 mmol/L (ref 135–145)
Total Bilirubin: 0.5 mg/dL (ref 0.3–1.2)
Total Protein: 6.7 g/dL (ref 6.5–8.1)

## 2016-02-23 LAB — LIPASE, BLOOD: LIPASE: 680 U/L — AB (ref 11–51)

## 2016-02-23 LAB — URINALYSIS, ROUTINE W REFLEX MICROSCOPIC
Bilirubin Urine: NEGATIVE
GLUCOSE, UA: NEGATIVE mg/dL
Ketones, ur: 15 mg/dL — AB
LEUKOCYTES UA: NEGATIVE
Nitrite: POSITIVE — AB
PROTEIN: NEGATIVE mg/dL
Specific Gravity, Urine: 1.016 (ref 1.005–1.030)
pH: 7.5 (ref 5.0–8.0)

## 2016-02-23 MED ORDER — ONDANSETRON HCL 4 MG/2ML IJ SOLN
4.0000 mg | Freq: Once | INTRAMUSCULAR | Status: AC
  Administered 2016-02-23: 4 mg via INTRAVENOUS
  Filled 2016-02-23: qty 2

## 2016-02-23 MED ORDER — SODIUM CHLORIDE 0.9 % IV BOLUS (SEPSIS)
1000.0000 mL | Freq: Once | INTRAVENOUS | Status: AC
  Administered 2016-02-23: 1000 mL via INTRAVENOUS

## 2016-02-23 MED ORDER — MORPHINE SULFATE (PF) 4 MG/ML IV SOLN
4.0000 mg | Freq: Once | INTRAVENOUS | Status: AC
Start: 2016-02-23 — End: 2016-02-23
  Administered 2016-02-23: 4 mg via INTRAVENOUS
  Filled 2016-02-23: qty 1

## 2016-02-23 NOTE — ED Provider Notes (Signed)
[facility]EMER[facility]ENCY DEPT Provider Note   CSN: [id number] Arrival date & time: 02/23/16  1225  First Provider Contact:  None       History   Chief Complaint Chief Complaint  Patient presents with  . Abdominal Pain    HPI Anita Poole is a 50 y.o. female.  Patient is a 50 year old female with history of recently diagnosed pancreatitis secondary to pancreatic duct stone. This was diagnosed approximately one month ago. The patient was admitted here and stayed for 3 days prior to being discharged. She was to be transferred to [facility] for placement of a pancreatic duct stent, however they had no beds. The patient was ultimately discharged as her condition was improving and advised to follow-up with them in the office. She is as of yet to receive an appointment.  She returns today with complaints of epigastric pain that feels similar to what she experienced in the past. She is felt nauseated and vomited twice. She denies any fevers or chills. She denies any diarrhea or bloody stools.   The history is provided by the patient.  Abdominal Pain   This is a recurrent problem. The current episode started yesterday. The problem occurs constantly. The problem has been gradually worsening. The pain is located in the epigastric region. The pain is moderate. Associated symptoms include nausea and vomiting. Pertinent negatives include diarrhea and constipation. Nothing aggravates the symptoms. Nothing relieves the symptoms.    Past Medical History:  Diagnosis Date  . Medical history non-contributory     Patient Active Problem List   Diagnosis Date Noted  . Acute pancreatitis 01/26/2016  . Acidosis 01/26/2016  . Leukocytosis 01/26/2016  . Hyperglycemia 01/26/2016  . Acute cystitis with hematuria 01/26/2016  . Choledocholithiasis 01/26/2016    Past Surgical History:  Procedure Laterality Date  . BREAST REDUCTION SURGERY    . CESAREAN SECTION    . SHOULDER SURGERY Left     OB History    No  data available       Home Medications    Prior to Admission medications   Medication Sig Start Date End Date Taking? Authorizing Provider  HYDROcodone-acetaminophen (NORCO/VICODIN) 5-325 MG tablet Take 1-2 tablets by mouth every 6 (six) hours as needed for moderate pain. 01/30/16   Richarda Overlie, MD  lipase/protease/amylase (CREON) 12000 units CPEP capsule Take 2 capsules (24,000 Units total) by mouth 3 (three) times daily before meals. 01/30/16   Richarda Overlie, MD  nicotine (NICODERM CQ - DOSED IN MG/24 HOURS) 21 mg/24hr patch Place 1 patch (21 mg total) onto the skin daily. 01/30/16   Richarda Overlie, MD  promethazine (PHENERGAN) 12.5 MG tablet Take 1 tablet (12.5 mg total) by mouth every 6 (six) hours as needed for nausea or vomiting. 01/30/16   Richarda Overlie, MD    Family History Family History  Problem Relation Age of Onset  . Diabetes Mother   . Kidney failure Mother   . Hypertension Mother   . Coronary artery disease Mother   . Lung cancer Father     Social History Social History  Substance Use Topics  . Smoking status: Current Every Day Smoker    Packs/day: 0.30    Years: 30.00    Types: Cigarettes  . Smokeless tobacco: Never Used  . Alcohol use No     Allergies   Review of patient's allergies indicates no known allergies.   Review of Systems Review of Systems  Gastrointestinal: Positive for abdominal pain, nausea and vomiting. Negative for constipation and diarrhea.  All other systems reviewed and are negative.    Physical Exam Updated Vital Signs BP 141/91   Pulse 69   Temp 98.3 F (36.8 C) (Oral)   Resp 16   Ht 5\' 5"  (1.651 m)   Wt 149 lb 6.4 oz (67.8 kg)   SpO2 99%   BMI 24.86 kg/m   Physical Exam  Constitutional: She is oriented to person, place, and time. She appears well-developed and well-nourished. No distress.  HENT:  Head: Normocephalic and atraumatic.  Neck: Normal range of motion. Neck supple.  Cardiovascular: Normal rate and regular  rhythm.  Exam reveals no gallop and no friction rub.   No murmur heard. Pulmonary/Chest: Effort normal and breath sounds normal. No respiratory distress. She has no wheezes. She has no rales.  Abdominal: Soft. Bowel sounds are normal. She exhibits no distension and no mass. There is tenderness. There is no rebound and no guarding.  There is tenderness to palpation in the epigastric region.  Musculoskeletal: Normal range of motion.  Neurological: She is alert and oriented to person, place, and time.  Skin: Skin is warm and dry. She is not diaphoretic.  Nursing note and vitals reviewed.    ED Treatments / Results  Labs (all labs ordered are listed, but only abnormal results are displayed) Labs Reviewed  LIPASE, BLOOD - Abnormal; Notable for the following:       Result Value   Lipase 680 (*)    All other components within normal limits  COMPREHENSIVE METABOLIC PANEL - Abnormal; Notable for the following:    CO2 21 (*)    Glucose, Bld 101 (*)    AST 12 (*)    ALT 12 (*)    All other components within normal limits  URINALYSIS, ROUTINE W REFLEX MICROSCOPIC (NOT AT [facility]) - Abnormal; Notable for the following:    APPearance TURBID (*)    Hgb urine dipstick MODERATE (*)    Ketones, ur 15 (*)    Nitrite POSITIVE (*)    All other components within normal limits  URINE MICROSCOPIC-ADD ON - Abnormal; Notable for the following:    Squamous Epithelial / LPF 0-5 (*)    Bacteria, UA MANY (*)    Casts GRANULAR CAST (*)    All other components within normal limits  CBC    EKG  EKG Interpretation None       Radiology No results found.  Procedures Procedures (including critical care time)  Medications Ordered in ED Medications - No data to display   Initial Impression / Assessment and Plan / ED Course  I have reviewed the triage vital signs and the nursing notes.  Pertinent labs & imaging results that were available during my care of the patient were reviewed by me and  considered in my medical decision making (see chart for details).  Clinical Course      Final Clinical Impressions(s) / ED Diagnoses   Final diagnoses:  None   Workup reveals an elevated lipase of 700. This patient has a complicated pancreatitis with calcifications both intraparenchymal and intraductal. I've discussed this with Dr. Bosie Clos from gastroenterology who is recommending transfer to a tertiary facility as she is a complicated pancreatitis case.  I initially attempted transfer to [facility], however they are holding patients in the ER and are on adult diversion. I've spoken with Dr. Chilton Si from [facility] from the department of gastroenterology who recommends admission to their facility on the hospitalist service. I've spoken with Dr. Mal Misty who agrees to  accept the patient in transfer.  While in the emergency department the patient has received IV fluids, pain and nausea medications, and appears stable. She will be transferred when a bed assignment is made.  New Prescriptions New Prescriptions   No medications on file     Geoffery Lyons, MD 02/23/16 1657

## 2016-02-23 NOTE — ED Triage Notes (Signed)
Pt seen here about 1 month ago for pancreatitis, was to be transferred to [facility] for surgery but d/t no beds pt was discharged home to f/u with [facility].  Pt is waiting on appt.  Onset 3 days abd pain, belching, nausea has gotten worse.

## 2016-02-25 MED FILL — Morphine Sulfate Inj 10 MG/ML: INTRAMUSCULAR | Qty: 1 | Status: AC

## 2016-03-21 ENCOUNTER — Ambulatory Visit (INDEPENDENT_AMBULATORY_CARE_PROVIDER_SITE_OTHER): Payer: Self-pay | Admitting: Family Medicine

## 2016-03-21 ENCOUNTER — Encounter: Payer: Self-pay | Admitting: Family Medicine

## 2016-03-21 VITALS — BP 129/74 | HR 74 | Temp 98.4°F | Resp 18 | Ht 65.0 in | Wt 146.0 lb

## 2016-03-21 DIAGNOSIS — Z7689 Persons encountering health services in other specified circumstances: Secondary | ICD-10-CM

## 2016-03-21 DIAGNOSIS — Z7189 Other specified counseling: Secondary | ICD-10-CM

## 2016-03-21 NOTE — Patient Instructions (Signed)
Follow -up in 3-6 months. When you have financial assistance Check on [employer].

## 2016-03-21 NOTE — Progress Notes (Signed)
Anita Poole, is a 50 y.o. female  MVH:[id number]  [id number]:[id number]  DOB - 1965/10/14  CC:  Chief Complaint  Patient presents with  . Establish Care       HPI: Anita Poole is a 50 y.o. female here to establish care. She was in hospital recently for pancreatitis related to GB. She was referred here for primary care. She is still taking Creon. She is awaiting stent placement. Other than this, she reports being healthy. She has not had medical care for a number of years. She is on no other meds.   Health Maintenance: She is in need of a number of health maintenace items, but would like to wait until she has Medicaid or other financial coverage.   No Known Allergies Past Medical History:  Diagnosis Date  . Medical history non-contributory    Current Outpatient Prescriptions on File Prior to Visit  Medication Sig Dispense Refill  . lipase/protease/amylase (CREON) 12000 units CPEP capsule Take 2 capsules (24,000 Units total) by mouth 3 (three) times daily before meals. 270 capsule 2  . promethazine (PHENERGAN) 12.5 MG tablet Take 1 tablet (12.5 mg total) by mouth every 6 (six) hours as needed for nausea or vomiting. 30 tablet 0  . HYDROcodone-acetaminophen (NORCO/VICODIN) 5-325 MG tablet Take 1-2 tablets by mouth every 6 (six) hours as needed for moderate pain. (Patient not taking: Reported on 03/21/2016) 20 tablet 0  . nicotine (NICODERM CQ - DOSED IN MG/24 HOURS) 21 mg/24hr patch Place 1 patch (21 mg total) onto the skin daily. (Patient not taking: Reported on 03/21/2016) 28 patch 0   No current facility-administered medications on file prior to visit.    Family History  Problem Relation Age of Onset  . Diabetes Mother   . Kidney failure Mother   . Hypertension Mother   . Coronary artery disease Mother   . Lung cancer Father    Social History   Social History  . Marital status: Single    Spouse name: N/A  . Number of children: N/A  . Years of education: N/A   Occupational  History  . Not on file.   Social History Main Topics  . Smoking status: Current Every Day Smoker    Packs/day: 0.30    Years: 30.00    Types: Cigarettes  . Smokeless tobacco: Never Used  . Alcohol use No  . Drug use: No  . Sexual activity: No   Other Topics Concern  . Not on file   Social History Narrative  . No narrative on file    Review of Systems: Constitutional: Negative. Did have some weight loss during this recent illness. Skin: Negative HENT: Negative  Eyes: Negative  Neck: Negative Respiratory: Negative Cardiovascular: Negative Gastrointestinal: Negative except for some intermittent RUQ pain and occ nausea Genitourinary: Negative  Musculoskeletal: Negative   Neurological: Negative for Hematological: Negative  Psychiatric/Behavioral: Negative    Objective:   Vitals:   03/21/16 0916  BP: 129/74  Pulse: 74  Resp: 18  Temp: 98.4 F (36.9 C)    Physical Exam: Constitutional: Patient appears well-developed and well-nourished. No distress. HENT: Normocephalic, atraumatic, External right and left ear normal. Oropharynx is clear and moist.  Eyes: Conjunctivae and EOM are normal. PERRLA, no scleral icterus. Neck: Normal ROM. Neck supple. No lymphadenopathy, No thyromegaly. CVS: RRR, S1/S2 +, no murmurs, no gallops, no rubs Pulmonary: Effort and breath sounds normal, no stridor, rhonchi, wheezes, rales.  Abdominal: Soft. Normoactive BS,, no distension,rebound or guarding. Mild RUQ tenderness Musculoskeletal:  Normal range of motion. No edema and no tenderness.  Neuro: Alert.Normal muscle tone coordination. Non-focal Skin: Skin is warm and dry. No rash noted. Not diaphoretic. No erythema. No pallor. Psychiatric: Normal mood and affect. Behavior, judgment, thought content normal.  Lab Results  Component Value Date   WBC 7.5 02/23/2016   HGB 13.8 02/23/2016   HCT 40.3 02/23/2016   MCV 89.8 02/23/2016   PLT 220 02/23/2016   Lab Results  Component Value  Date   CREATININE 0.59 02/23/2016   BUN 9 02/23/2016   NA 138 02/23/2016   K 3.8 02/23/2016   CL 109 02/23/2016   CO2 21 (L) 02/23/2016    Lab Results  Component Value Date   HGBA1C 5.3 01/27/2016   Lipid Panel  No results found for: CHOL, TRIG, HDL, CHOLHDL, VLDL, LDLCALC     Assessment and plan:   1. Encounter to establish care -I have reviewed information provided by the patient and pertinent information from her chart.    Follow-up 3-6 months for health maintenance.  The patient was given clear instructions to go to ER or return to medical center if symptoms don't improve, worsen or new problems develop. The patient verbalized understanding.    Henrietta Hoover FNP  03/21/2016, 3:07 PM

## 2016-03-21 NOTE — Progress Notes (Signed)
Patient is here to establish care  Patient denies pain at this time.   

## 2016-06-20 ENCOUNTER — Ambulatory Visit: Payer: Self-pay | Admitting: Family Medicine

## 2016-08-28 DIAGNOSIS — K8689 Other specified diseases of pancreas: Secondary | ICD-10-CM | POA: Diagnosis not present

## 2016-08-28 DIAGNOSIS — K861 Other chronic pancreatitis: Secondary | ICD-10-CM | POA: Diagnosis not present

## 2016-09-12 ENCOUNTER — Encounter: Payer: Self-pay | Admitting: Primary Care

## 2016-09-12 ENCOUNTER — Ambulatory Visit (INDEPENDENT_AMBULATORY_CARE_PROVIDER_SITE_OTHER): Payer: BLUE CROSS/BLUE SHIELD | Admitting: Primary Care

## 2016-09-12 ENCOUNTER — Encounter (INDEPENDENT_AMBULATORY_CARE_PROVIDER_SITE_OTHER): Payer: Self-pay

## 2016-09-12 VITALS — BP 120/78 | HR 57 | Temp 98.0°F | Ht 64.0 in | Wt 149.8 lb

## 2016-09-12 DIAGNOSIS — F331 Major depressive disorder, recurrent, moderate: Secondary | ICD-10-CM | POA: Diagnosis not present

## 2016-09-12 DIAGNOSIS — K861 Other chronic pancreatitis: Secondary | ICD-10-CM | POA: Diagnosis not present

## 2016-09-12 DIAGNOSIS — Z720 Tobacco use: Secondary | ICD-10-CM | POA: Diagnosis not present

## 2016-09-12 MED ORDER — BUPROPION HCL ER (SR) 150 MG PO TB12: 150.0000 mg | ORAL_TABLET | Freq: Two times a day (BID) | ORAL | 1 refills | Status: DC

## 2016-09-12 NOTE — Progress Notes (Signed)
Subjective:    Patient ID: Anita Poole, female    DOB: [birth year], 51 y.o.   MRN: [id number]  HPI  Anita Poole is a 51 year old female who presents today to establish care and discuss the problems mentioned below. Will obtain old records.  1) Tobacco Abuse: Previously managed on the nicotine patch and nicotine gum which curbed her cravings during her hospitalizations, but as soon as she was discharged she started smoking again. She smokes 1/2 PPD, sometimes more. She's smoked since age 51. She denies unexplained weight loss, hemoptysis, shortness of breath. She is very motivated to quit.  2) GERD: Currently managed on omeprazole 20 mg. She will experience symptoms of belching, epigastric pain, esophageal burning and pain without her medications. Symptoms well controlled on medication.  3) Pancreatic Insufficiency: Acute pancreatitis in July 2017 and a second attack several weeks later. Last hospitalization was in October 2017. She's currently with [facility] who is considering surgery. Currently managed on Creon 12000 units. She is also managed on dicyclomine for IBS symptoms.   4) Depression: Present for the past several years. She's been through a lot of stress since she moved to [place] 1 year ago. She will have episodes of tearfulness, sadness, and not wanting to do anything.  PHQ 9 score of 13 today. She denies SI/HI.   Review of Systems  Constitutional: Negative for fever and unexpected weight change.  Respiratory: Negative for cough and shortness of breath.   Cardiovascular: Negative for chest pain.  Gastrointestinal:       GERD. Abdominal pain stable  Psychiatric/Behavioral:       Depression       Past Medical History:  Diagnosis Date  . Chronic pancreatitis (HCC)   . Depression   . GERD (gastroesophageal reflux disease)   . Medical history non-contributory      Social History   Social History  . Marital status: Single    Spouse name: N/A  . Number of  children: N/A  . Years of education: N/A   Occupational History  . Not on file.   Social History Main Topics  . Smoking status: Current Every Day Smoker    Packs/day: 0.30    Years: 30.00    Types: Cigarettes  . Smokeless tobacco: Never Used  . Alcohol use No  . Drug use: No  . Sexual activity: No   Other Topics Concern  . Not on file   Social History Narrative   Single.   Moved from [place].   Works at [employer].   Enjoys spending time with family.    Past Surgical History:  Procedure Laterality Date  . BREAST REDUCTION SURGERY    . CESAREAN SECTION    . SHOULDER SURGERY Left     Family History  Problem Relation Age of Onset  . Diabetes Mother   . Kidney failure Mother   . Hypertension Mother   . Coronary artery disease Mother   . Heart disease Mother   . Lung cancer Father     No Known Allergies  Current Outpatient Prescriptions on File Prior to Visit  Medication Sig Dispense Refill  . lipase/protease/amylase (CREON) 12000 units CPEP capsule Take 2 capsules (24,000 Units total) by mouth 3 (three) times daily before meals. 270 capsule 2   No current facility-administered medications on file prior to visit.     BP 120/78   Pulse (!) 57   Temp 98 F (36.7 C) (Oral)   Ht 5\' 4"  (  1.626 m)   Wt 149 lb 12.8 oz (67.9 kg)   SpO2 97%   BMI 25.71 kg/m    Objective:   Physical Exam  Constitutional: She appears well-nourished.  Neck: Neck supple.  Cardiovascular: Normal rate and regular rhythm.   Pulmonary/Chest: Effort normal and breath sounds normal.  Skin: Skin is warm and dry.  Psychiatric: She has a normal mood and affect.          Assessment & Plan:

## 2016-09-12 NOTE — Assessment & Plan Note (Signed)
Smoker for the past 30+ years, motivated to quit. Failed OTC treatment. Discussed options and given her history of depression, will start Wellbutrin. Discussed when to start and to choose a quit date before starting. Discussed potential side effects. Follow up in 6 weeks.

## 2016-09-12 NOTE — Assessment & Plan Note (Signed)
Managed on Creon, [facility]. Considering surgical intervention.

## 2016-09-12 NOTE — Assessment & Plan Note (Signed)
Symptoms present for years, worse over last 1 year. PHQ 9 score of 13 today. Will start Wellbutrin given tobacco abuse history and motivation to quit. discussed potential side effects. Follow up in 6 weeks.

## 2016-09-12 NOTE — Patient Instructions (Signed)
Start bupropion 150 mg tablets for depression and tobacco abuse. Start by taking 1 tablet by mouth once daily for 3 days, then increase to 1 tablet twice daily thereafter.  Choose a quit date before you begin treatment.    Follow up in 6 weeks for re-evaluation of depression and smoking.   It was a pleasure to meet you today! Please don't hesitate to call me with any questions. Welcome to [facility]!

## 2016-09-12 NOTE — Progress Notes (Signed)
Pre visit review using our clinic review tool, if applicable. No additional management support is needed unless otherwise documented below in the visit note. 

## 2016-10-10 ENCOUNTER — Other Ambulatory Visit: Payer: Self-pay | Admitting: Primary Care

## 2016-10-10 DIAGNOSIS — R739 Hyperglycemia, unspecified: Secondary | ICD-10-CM

## 2016-10-10 DIAGNOSIS — Z0000 Encounter for general adult medical examination without abnormal findings: Secondary | ICD-10-CM

## 2016-10-21 ENCOUNTER — Other Ambulatory Visit: Payer: BLUE CROSS/BLUE SHIELD

## 2016-10-22 ENCOUNTER — Other Ambulatory Visit (INDEPENDENT_AMBULATORY_CARE_PROVIDER_SITE_OTHER): Payer: BLUE CROSS/BLUE SHIELD

## 2016-10-22 ENCOUNTER — Encounter (INDEPENDENT_AMBULATORY_CARE_PROVIDER_SITE_OTHER): Payer: Self-pay

## 2016-10-22 DIAGNOSIS — Z0000 Encounter for general adult medical examination without abnormal findings: Secondary | ICD-10-CM | POA: Diagnosis not present

## 2016-10-22 DIAGNOSIS — R739 Hyperglycemia, unspecified: Secondary | ICD-10-CM

## 2016-10-23 LAB — LIPID PANEL
CHOLESTEROL: 227 mg/dL — AB (ref 0–200)
HDL: 56.2 mg/dL (ref >39.00)
LDL Cholesterol: 152 mg/dL — ABNORMAL HIGH (ref 0–99)
NONHDL: 170.35
TRIGLYCERIDES: 94 mg/dL (ref 0.0–149.0)
Total CHOL/HDL Ratio: 4
VLDL: 18.8 mg/dL (ref 0.0–40.0)

## 2016-10-23 LAB — COMPREHENSIVE METABOLIC PANEL
ALBUMIN: 4.1 g/dL (ref 3.5–5.2)
ALK PHOS: 128 U/L — AB (ref 39–117)
ALT: 28 U/L (ref 0–35)
AST: 22 U/L (ref 0–37)
BUN: 17 mg/dL (ref 6–23)
CHLORIDE: 107 meq/L (ref 96–112)
CO2: 27 mEq/L (ref 19–32)
Calcium: 10.2 mg/dL (ref 8.4–10.5)
Creatinine, Ser: 0.74 mg/dL (ref 0.40–1.20)
GFR: 87.33 mL/min (ref >60.00)
Glucose, Bld: 97 mg/dL (ref 70–99)
POTASSIUM: 4.1 meq/L (ref 3.5–5.1)
SODIUM: 138 meq/L (ref 135–145)
TOTAL PROTEIN: 7.1 g/dL (ref 6.0–8.3)
Total Bilirubin: 0.3 mg/dL (ref 0.2–1.2)

## 2016-10-23 LAB — HEMOGLOBIN A1C: HEMOGLOBIN A1C: 5.9 % (ref 4.6–6.5)

## 2016-10-24 ENCOUNTER — Other Ambulatory Visit (HOSPITAL_COMMUNITY)
Admission: RE | Admit: 2016-10-24 | Discharge: 2016-10-24 | Disposition: A | Discharge status: Home / Self Care | Payer: BLUE CROSS/BLUE SHIELD | Source: Ambulatory Visit | Attending: Internal Medicine | Admitting: Internal Medicine

## 2016-10-24 ENCOUNTER — Encounter: Payer: Self-pay | Admitting: Primary Care

## 2016-10-24 ENCOUNTER — Ambulatory Visit (INDEPENDENT_AMBULATORY_CARE_PROVIDER_SITE_OTHER): Payer: BLUE CROSS/BLUE SHIELD | Admitting: Primary Care

## 2016-10-24 VITALS — BP 118/74 | HR 76 | Temp 97.6°F | Ht 64.0 in | Wt 153.4 lb

## 2016-10-24 DIAGNOSIS — Z0000 Encounter for general adult medical examination without abnormal findings: Secondary | ICD-10-CM | POA: Insufficient documentation

## 2016-10-24 DIAGNOSIS — Z1211 Encounter for screening for malignant neoplasm of colon: Secondary | ICD-10-CM | POA: Diagnosis not present

## 2016-10-24 DIAGNOSIS — R7989 Other specified abnormal findings of blood chemistry: Secondary | ICD-10-CM

## 2016-10-24 DIAGNOSIS — F331 Major depressive disorder, recurrent, moderate: Secondary | ICD-10-CM | POA: Diagnosis not present

## 2016-10-24 DIAGNOSIS — Z1231 Encounter for screening mammogram for malignant neoplasm of breast: Secondary | ICD-10-CM | POA: Diagnosis not present

## 2016-10-24 DIAGNOSIS — K861 Other chronic pancreatitis: Secondary | ICD-10-CM | POA: Diagnosis not present

## 2016-10-24 DIAGNOSIS — Z0001 Encounter for general adult medical examination with abnormal findings: Secondary | ICD-10-CM | POA: Insufficient documentation

## 2016-10-24 DIAGNOSIS — R945 Abnormal results of liver function studies: Secondary | ICD-10-CM

## 2016-10-24 DIAGNOSIS — R7303 Prediabetes: Secondary | ICD-10-CM | POA: Diagnosis not present

## 2016-10-24 DIAGNOSIS — E785 Hyperlipidemia, unspecified: Secondary | ICD-10-CM | POA: Insufficient documentation

## 2016-10-24 DIAGNOSIS — Z124 Encounter for screening for malignant neoplasm of cervix: Secondary | ICD-10-CM

## 2016-10-24 DIAGNOSIS — Z1239 Encounter for other screening for malignant neoplasm of breast: Secondary | ICD-10-CM

## 2016-10-24 NOTE — Assessment & Plan Note (Signed)
Due to see GI in May 2018. Alk Phos slightly elevated today, continue to monitor.

## 2016-10-24 NOTE — Assessment & Plan Note (Addendum)
Improved on Wellbutrin, continue same. Denies SI/HI.

## 2016-10-24 NOTE — Progress Notes (Signed)
Pre visit review using our clinic review tool, if applicable. No additional management support is needed unless otherwise documented below in the visit note. 

## 2016-10-24 NOTE — Assessment & Plan Note (Signed)
TC and LDL above goal. Discussed to work on diet and start exercising. Will recheck in 6 months, consider statin but will need to monitor LFT's.

## 2016-10-24 NOTE — Assessment & Plan Note (Signed)
Declines Td. Pap and mammogram due, both pending. Colonoscopy due, ordered. Discussed the importance of a healthy diet and regular exercise in order for weight loss, and to reduce the risk of other medical diseases. Exam unremarkable. Labs with hyperlipidemia and prediabetes. Follow up in 1 year for annal exam.

## 2016-10-24 NOTE — Addendum Note (Signed)
Addended by: Tawnya Crook on: 10/24/2016 11:17 AM   Modules accepted: Orders

## 2016-10-24 NOTE — Patient Instructions (Signed)
We will notify you of your Pap results once received.  Call the [facility] and schedule your mammogram.  You will be contacted regarding your referral to GI for your colonoscopy.  Please let us know if you have not heard back within one week.   Start exercising. You should be getting 150 minutes of moderate intensity exercise weekly.  Continue your efforts towards a healthy diet. Increase vegetable, fruit, whole grain consumption.  Schedule a lab only appointment in 6 months to check cholesterol and blood sugar.   Follow up in 1 year for your annual exam or sooner if needed.  It was a pleasure to see you today!

## 2016-10-24 NOTE — Progress Notes (Signed)
Subjective:    Patient ID: Anita Poole, female    DOB: 1966-04-22, 51 y.o.   MRN: [id number]  HPI  Anita Poole is a 51 year old female who presents today for complete physical.  Has had a few pancreatitis attacks since last visit. Due to see GI in middle May 2018.   Immunizations: -Tetanus: Never completed. Declines. -Influenza: Never completed.    Diet: She endorses a healthy diet. Breakfast: Skips, cereal Lunch: Skips Dinner: Chicken, boxed meals, some vegetables, start Snacks: Rare, fruit Desserts: None Beverages: Coffee, [employer] shake, little water  Exercise: She does not currently exercising, is active at work Eye exam: Completed in January 2018 Dental exam: Plans on scheduling soon. Colonoscopy: Due, will schedule Pap Smear: Completed over 5 years. Due today. Mammogram: Never completed, due.   Review of Systems  Constitutional: Negative for unexpected weight change.  HENT: Negative for rhinorrhea.   Respiratory: Negative for cough and shortness of breath.   Cardiovascular: Negative for chest pain.  Gastrointestinal: Negative for constipation and diarrhea.  Genitourinary: Negative for difficulty urinating and menstrual problem.  Musculoskeletal: Negative for arthralgias and myalgias.  Skin: Negative for rash.  Allergic/Immunologic: Negative for environmental allergies.  Neurological: Negative for dizziness, numbness and headaches.  Psychiatric/Behavioral:       Overall stable on current regimen.       Past Medical History:  Diagnosis Date  . Chronic pancreatitis (HCC)   . Depression   . GERD (gastroesophageal reflux disease)   . Medical history non-contributory      Social History   Social History  . Marital status: Single    Spouse name: N/A  . Number of children: N/A  . Years of education: N/A   Occupational History  . Not on file.   Social History Main Topics  . Smoking status: Current Every Day Smoker    Packs/day: 0.30    Years:  30.00    Types: Cigarettes  . Smokeless tobacco: Never Used  . Alcohol use No  . Drug use: No  . Sexual activity: No   Other Topics Concern  . Not on file   Social History Narrative   Single.   Moved from [place].   Works at [employer].   Enjoys spending time with family.    Past Surgical History:  Procedure Laterality Date  . BREAST REDUCTION SURGERY    . CESAREAN SECTION    . SHOULDER SURGERY Left     Family History  Problem Relation Age of Onset  . Diabetes Mother   . Kidney failure Mother   . Hypertension Mother   . Coronary artery disease Mother   . Heart disease Mother   . Lung cancer Father     No Known Allergies  Current Outpatient Prescriptions on File Prior to Visit  Medication Sig Dispense Refill  . buPROPion (WELLBUTRIN SR) 150 MG 12 hr tablet Take 1 tablet (150 mg total) by mouth 2 (two) times daily. 180 tablet 1  . dicyclomine (BENTYL) 10 MG capsule Take 10 mg by mouth 3 (three) times daily.    . ondansetron (ZOFRAN) 4 MG tablet TAKE 1 TABLET BY MOUTH EVERY 6 HOURS AS NEEDED FOR NAUSEA AND VOMITING  0   No current facility-administered medications on file prior to visit.     BP 118/74   Pulse 76   Temp 97.6 F (36.4 C) (Oral)   Ht  (1.626 m)   Wt 153 lb 6.4 oz (69.6 kg)   SpO2 98%  BMI 26.33 kg/m    Objective:   Physical Exam  Constitutional: She is oriented to person, place, and time. She appears well-nourished.  HENT:  Right Ear: Tympanic membrane and ear canal normal.  Left Ear: Tympanic membrane and ear canal normal.  Nose: Nose normal.  Mouth/Throat: Oropharynx is clear and moist.  Eyes: Conjunctivae and EOM are normal. Pupils are equal, round, and reactive to light.  Neck: Neck supple. No thyromegaly present.  Cardiovascular: Normal rate and regular rhythm.   No murmur heard. Pulmonary/Chest: Effort normal and breath sounds normal. She has no rales.  Abdominal: Soft. Bowel sounds are normal. There is no tenderness.  Mild  generalized abdominal tenderness, moreso to epigastric region  Genitourinary: There is no tenderness or lesion on the right labia. There is no tenderness or lesion on the left labia. Cervix exhibits no motion tenderness and no discharge. Right adnexum displays no tenderness. Left adnexum displays no tenderness. No vaginal discharge found.  Musculoskeletal: Normal range of motion.  Lymphadenopathy:    She has no cervical adenopathy.  Neurological: She is alert and oriented to person, place, and time. She has normal reflexes. No cranial nerve deficit.  Skin: Skin is warm and dry. No rash noted.  Psychiatric: She has a normal mood and affect.          Assessment & Plan:

## 2016-10-24 NOTE — Assessment & Plan Note (Signed)
Recent A1C of 5.9.  Discussed to work on diet and start exercising. Repeat in 6 months.

## 2016-10-29 ENCOUNTER — Encounter: Payer: Self-pay | Admitting: *Deleted

## 2016-10-29 LAB — CYTOLOGY - PAP
Adequacy: ABSENT
DIAGNOSIS: NEGATIVE
HPV (WINDOPATH): NOT DETECTED

## 2016-11-04 ENCOUNTER — Other Ambulatory Visit: Payer: Self-pay | Admitting: *Deleted

## 2016-11-04 DIAGNOSIS — K861 Other chronic pancreatitis: Secondary | ICD-10-CM

## 2016-11-04 MED ORDER — ONDANSETRON HCL 4 MG PO TABS: ORAL_TABLET | ORAL | 1 refills | Status: DC

## 2016-11-04 NOTE — Telephone Encounter (Signed)
Patient called requesting a refill on Zofran. Patient stated that this was prescribed by another doctor and they would not refill it. Patient stated that they told her to call her PCP and request the refill.  Patient requested a call back to let her know that this has been done.

## 2016-11-04 NOTE — Telephone Encounter (Signed)
Noted, Rx sent to pharmacy. 

## 2016-11-04 NOTE — Telephone Encounter (Signed)
Spoken and notified patient of Kate's comments. Patient verbalized understanding. 

## 2016-11-06 ENCOUNTER — Other Ambulatory Visit: Payer: Self-pay

## 2016-11-06 DIAGNOSIS — Z720 Tobacco use: Secondary | ICD-10-CM

## 2016-11-06 DIAGNOSIS — F331 Major depressive disorder, recurrent, moderate: Secondary | ICD-10-CM

## 2016-11-06 MED ORDER — BUPROPION HCL ER (SR) 150 MG PO TB12: 150.0000 mg | ORAL_TABLET | Freq: Two times a day (BID) | ORAL | 3 refills | Status: DC

## 2016-11-06 NOTE — Telephone Encounter (Signed)
[facility] left v/m requesting rx bupropion. Pt said that ins requires meds routinely taken must be sent to mail order pharmacy. Last annual exam on 10/24/16. Refill done per protocol and pt voiced understanding.

## 2016-11-27 ENCOUNTER — Other Ambulatory Visit: Payer: Self-pay | Admitting: *Deleted

## 2016-11-27 MED ORDER — PANCRELIPASE (LIP-PROT-AMYL) 24000-76000 UNITS PO CPEP: ORAL_CAPSULE | ORAL | 0 refills | Status: DC

## 2016-12-04 DIAGNOSIS — K861 Other chronic pancreatitis: Secondary | ICD-10-CM | POA: Diagnosis not present

## 2016-12-04 DIAGNOSIS — K8689 Other specified diseases of pancreas: Secondary | ICD-10-CM | POA: Diagnosis not present

## 2016-12-22 ENCOUNTER — Other Ambulatory Visit: Payer: Self-pay

## 2016-12-22 DIAGNOSIS — K861 Other chronic pancreatitis: Secondary | ICD-10-CM

## 2016-12-22 NOTE — Telephone Encounter (Signed)
Looks like she was just seen by GI, this refill request needs to go to GI.

## 2016-12-22 NOTE — Telephone Encounter (Signed)
Per DPR, left detail message of Kate's comments for patient. 

## 2016-12-22 NOTE — Telephone Encounter (Signed)
Pt received # 180 of pancrelipase; pt pd $200 for the # 180 but pt takes 8 capsules a day and request 3 month supply # 720 to [facility]. Pt will pay the same co pay for 3 month supply. Pt request cb when done; last annual 10/24/16.

## 2016-12-22 NOTE — Telephone Encounter (Signed)
Pt called back, she received your msg. Pt states last GI visit she was told she needs to get refilled from PCP. She is requesting a cb.

## 2016-12-22 NOTE — Telephone Encounter (Signed)
Patient is currently managed per GI through [facility] for pancreatic insufficiency. They will need to manage and refill this medication. I provided her with a 1 month supply and work to get her through to her GI visit.

## 2016-12-25 NOTE — Telephone Encounter (Signed)
Patient called me back. Spoken and notified patient of Kate's comments.   Patient stated that she was told by GI that her primary should be refilling this medication but she will ask GI in her next follow up in August  She asked if Jae DireKate can send in 90 days Supply to [facility] because it does not matter 30 days or 90 days, the cost will be $200.

## 2016-12-26 ENCOUNTER — Other Ambulatory Visit: Payer: Self-pay | Admitting: Primary Care

## 2016-12-26 ENCOUNTER — Telehealth: Payer: Self-pay | Admitting: Primary Care

## 2016-12-26 MED ORDER — PANCRELIPASE (LIP-PROT-AMYL) 24000-76000 UNITS PO CPEP: ORAL_CAPSULE | ORAL | 0 refills | Status: DC

## 2016-12-26 NOTE — Telephone Encounter (Signed)
I just sent a refill for three months but this may be incorrect. Based off of the directions it looks like she's taking this three times daily? Or is it twice daily? Please clarify and I'll send in a corrected refill. Also, yes, please remind her to talk about future refills with GI.

## 2017-01-01 ENCOUNTER — Encounter: Payer: Self-pay | Admitting: Primary Care

## 2017-01-07 NOTE — Telephone Encounter (Signed)
Message left for patient to return my call.  

## 2017-01-07 NOTE — Telephone Encounter (Signed)
Stacy with [facility] left v/m requesting 90 day supply for pancrelipase and how many does pt take daily including the snack med. Stacy request cb using ref # [id number].

## 2017-01-07 NOTE — Telephone Encounter (Signed)
I'm not sure, please call patient and clarify how many she's taking daily. Again, this should be prescribed per GI as she is seeing them for pancreatic insufficiency. Please remind her to get this refilled per GI.

## 2017-01-09 ENCOUNTER — Telehealth: Payer: Self-pay | Admitting: *Deleted

## 2017-01-09 DIAGNOSIS — K861 Other chronic pancreatitis: Secondary | ICD-10-CM

## 2017-01-09 MED ORDER — PANCRELIPASE (LIP-PROT-AMYL) 24000-76000 UNITS PO CPEP
ORAL_CAPSULE | ORAL | 0 refills | Status: DC
Start: 2017-01-09 — End: 2019-07-29

## 2017-01-09 NOTE — Telephone Encounter (Signed)
This was addressed in another telephone encounter

## 2017-01-09 NOTE — Telephone Encounter (Signed)
Spoke to pharmacist who is requesting a call back to confirm directions for pts pancrelipase Rx. It was written for #180 but pt was instructed to take TID; 2 caps with meal and one with a snack. Pharmacy also states pt is wanting a 90D prescription. Pls verify instruction and quantity and contact pharmacy back with ref# provided.

## 2017-01-09 NOTE — Telephone Encounter (Signed)
Refill sent for 90 day supply. 

## 2017-01-09 NOTE — Telephone Encounter (Signed)
Spoken to patient and she stated that she takes 8 capsules a day -   Breakfast - 2 capsules Snack - 1 capsule Lunch - 2 capsules Snack - 1 capsule Dinner - 2 capsules  Patient asked if Jae DireKate can send in 90 days supply for now since it will cost $200 out of pocket whether or not she get 30 or 90. Patient stated that she is getting nowhere regarding the refill of this medication. Patient stated was told by GI that primary provider would refills this. All this back and forth, she stated that she would this refill this once and she just going to stop taking it.

## 2017-01-09 NOTE — Telephone Encounter (Signed)
Please contact patient and verify instructions. When is she due to see GI? They will need to provide subsequent refills as they are managing her pancreatic insufficiency.

## 2017-02-09 DIAGNOSIS — K859 Acute pancreatitis without necrosis or infection, unspecified: Secondary | ICD-10-CM | POA: Diagnosis not present

## 2017-02-09 DIAGNOSIS — K861 Other chronic pancreatitis: Secondary | ICD-10-CM | POA: Diagnosis not present

## 2017-02-09 DIAGNOSIS — F17210 Nicotine dependence, cigarettes, uncomplicated: Secondary | ICD-10-CM | POA: Diagnosis not present

## 2017-02-09 DIAGNOSIS — Z79899 Other long term (current) drug therapy: Secondary | ICD-10-CM | POA: Diagnosis not present

## 2017-02-09 DIAGNOSIS — K5900 Constipation, unspecified: Secondary | ICD-10-CM | POA: Diagnosis not present

## 2017-02-09 DIAGNOSIS — Z1211 Encounter for screening for malignant neoplasm of colon: Secondary | ICD-10-CM | POA: Diagnosis not present

## 2017-02-27 ENCOUNTER — Other Ambulatory Visit: Payer: Self-pay | Admitting: Family Medicine

## 2017-02-27 DIAGNOSIS — K861 Other chronic pancreatitis: Secondary | ICD-10-CM

## 2017-02-27 NOTE — Telephone Encounter (Signed)
Looks like she saw GI in May through [facility]. Rx request will need to go through GI.

## 2017-02-27 NOTE — Telephone Encounter (Signed)
Message left for patient to return my call.  

## 2017-02-27 NOTE — Telephone Encounter (Signed)
Ok to refill? Electronically refill request for Pancrelipase, Lip-Prot-Amyl, 24000-76000 units CPEP  Last prescribed on 01/09/2017. Last seen on 10/24/2016

## 2017-03-06 NOTE — Telephone Encounter (Signed)
Message left for patient to return my call.  

## 2017-03-19 DIAGNOSIS — K861 Other chronic pancreatitis: Secondary | ICD-10-CM | POA: Diagnosis not present

## 2017-04-21 DIAGNOSIS — Z1211 Encounter for screening for malignant neoplasm of colon: Secondary | ICD-10-CM | POA: Diagnosis not present

## 2017-04-21 DIAGNOSIS — D125 Benign neoplasm of sigmoid colon: Secondary | ICD-10-CM | POA: Diagnosis not present

## 2017-04-21 DIAGNOSIS — K635 Polyp of colon: Secondary | ICD-10-CM | POA: Diagnosis not present

## 2017-04-21 DIAGNOSIS — K640 First degree hemorrhoids: Secondary | ICD-10-CM | POA: Diagnosis not present

## 2017-04-21 DIAGNOSIS — D122 Benign neoplasm of ascending colon: Secondary | ICD-10-CM | POA: Diagnosis not present

## 2017-04-21 DIAGNOSIS — D126 Benign neoplasm of colon, unspecified: Secondary | ICD-10-CM | POA: Diagnosis not present

## 2017-04-21 DIAGNOSIS — K5730 Diverticulosis of large intestine without perforation or abscess without bleeding: Secondary | ICD-10-CM | POA: Diagnosis not present

## 2017-04-23 LAB — HM COLONOSCOPY

## 2017-04-24 ENCOUNTER — Other Ambulatory Visit (INDEPENDENT_AMBULATORY_CARE_PROVIDER_SITE_OTHER): Payer: BLUE CROSS/BLUE SHIELD

## 2017-04-24 DIAGNOSIS — R7303 Prediabetes: Secondary | ICD-10-CM

## 2017-04-24 DIAGNOSIS — E785 Hyperlipidemia, unspecified: Secondary | ICD-10-CM

## 2017-04-24 DIAGNOSIS — R945 Abnormal results of liver function studies: Secondary | ICD-10-CM

## 2017-04-24 DIAGNOSIS — R7989 Other specified abnormal findings of blood chemistry: Secondary | ICD-10-CM

## 2017-04-24 LAB — HEMOGLOBIN A1C: HEMOGLOBIN A1C: 5.7 % (ref 4.6–6.5)

## 2017-04-24 LAB — LIPID PANEL
CHOL/HDL RATIO: 4
Cholesterol: 208 mg/dL — ABNORMAL HIGH (ref 0–200)
HDL: 54.1 mg/dL (ref >39.00)
LDL CALC: 135 mg/dL — AB (ref 0–99)
NonHDL: 154.18
Triglycerides: 97 mg/dL (ref 0.0–149.0)
VLDL: 19.4 mg/dL (ref 0.0–40.0)

## 2017-04-24 LAB — HEPATIC FUNCTION PANEL
ALT: 12 U/L (ref 0–35)
AST: 12 U/L (ref 0–37)
Albumin: 3.9 g/dL (ref 3.5–5.2)
Alkaline Phosphatase: 104 U/L (ref 39–117)
Bilirubin, Direct: 0.1 mg/dL (ref 0.0–0.3)
TOTAL PROTEIN: 6.5 g/dL (ref 6.0–8.3)
Total Bilirubin: 0.4 mg/dL (ref 0.2–1.2)

## 2017-04-25 ENCOUNTER — Encounter: Payer: Self-pay | Admitting: Primary Care

## 2018-03-01 IMAGING — CT CT ABD-PELV W/ CM
2 of 5 series · 15 of 46 positions shown, 17 images · IV contrast (Omni 300)
Comparison: 01/26/2016 abdominal sonogram.

CLINICAL DATA: 50-year-old female inpatient with acute
pancreatitis.

EXAM:
CT ABDOMEN AND PELVIS WITH CONTRAST
TECHNIQUE: Multidetector CT imaging of the abdomen and pelvis was performed
using the standard protocol following bolus administration of
intravenous contrast.
CONTRAST:  100mL OSQT6D-5GG IOPAMIDOL (OSQT6D-5GG) INJECTION 61%

[Series 2: a/p w/ 5mm · axial · 0.78mm/px · z∈[-524,-110]mm · 12 of 99 slices shown, 14 images]
[im 8/99  soft-tissue]
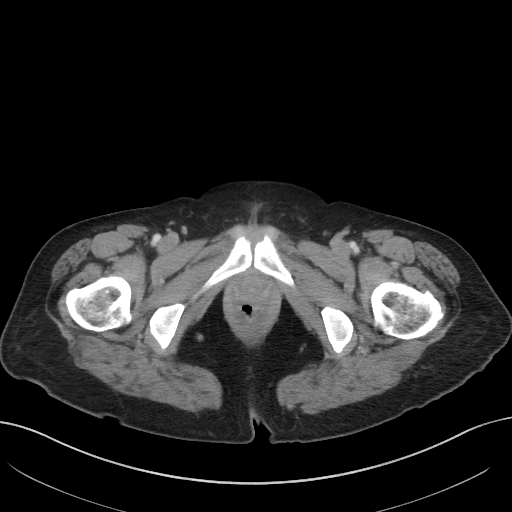
[im 8/99  bone]
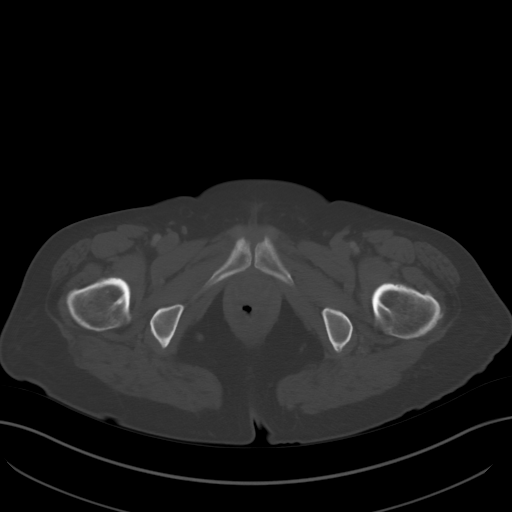
[im 16/99  soft-tissue]
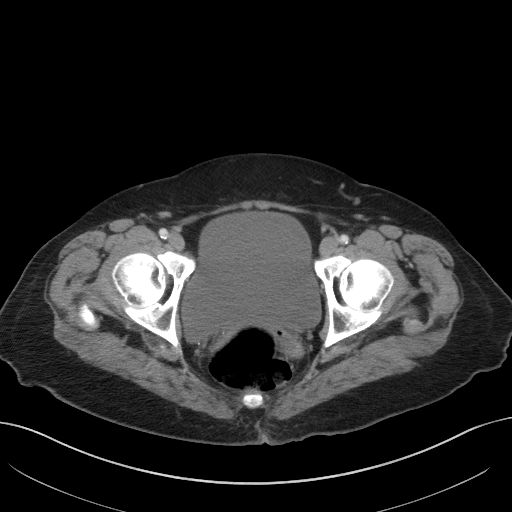
[im 23/99  soft-tissue]
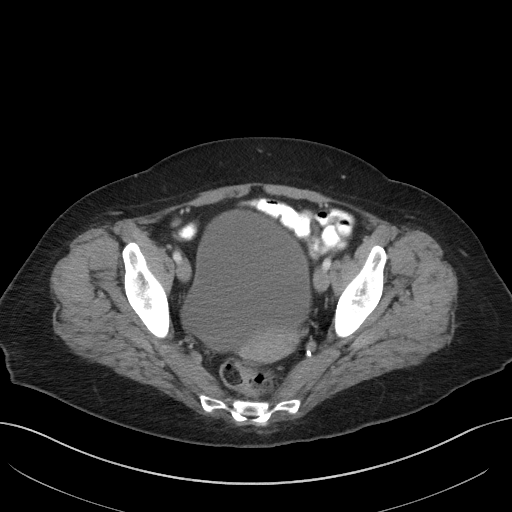
[im 31/99  soft-tissue]
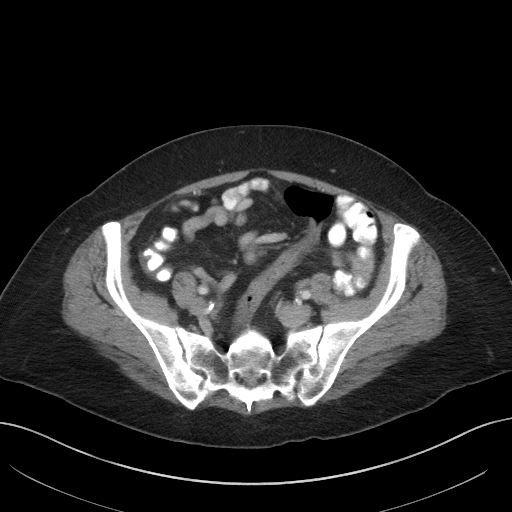
[im 38/99  soft-tissue]
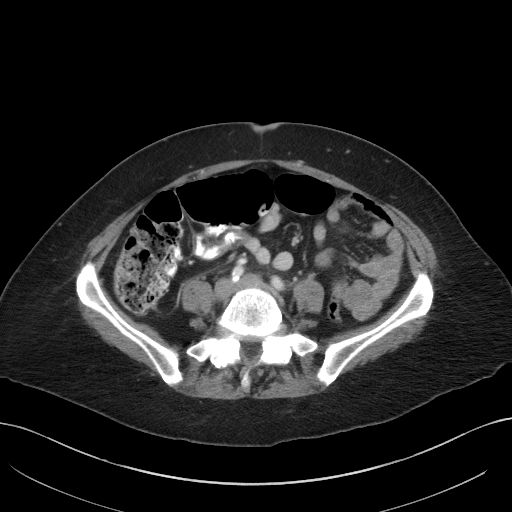
[im 46/99  soft-tissue]
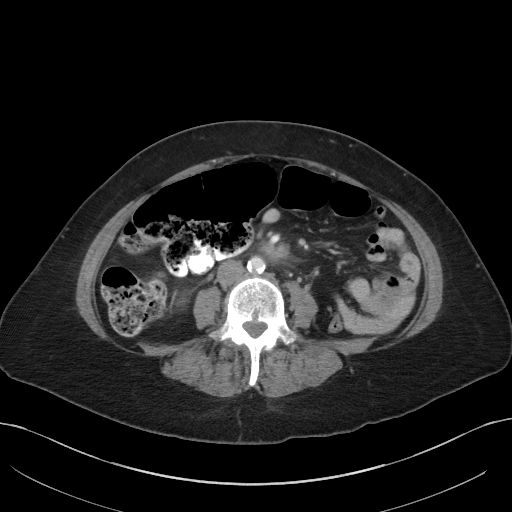
[im 53/99  soft-tissue]
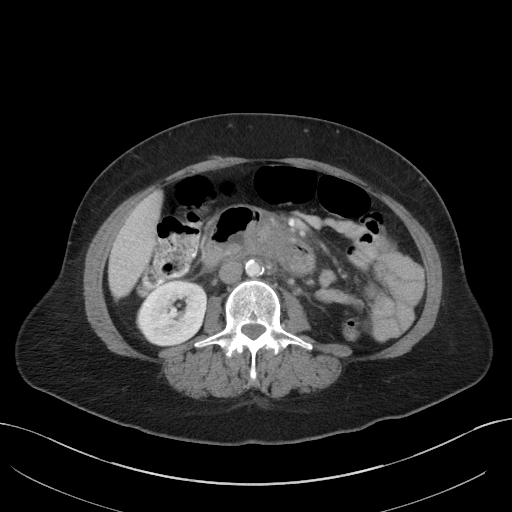
[im 61/99  soft-tissue]
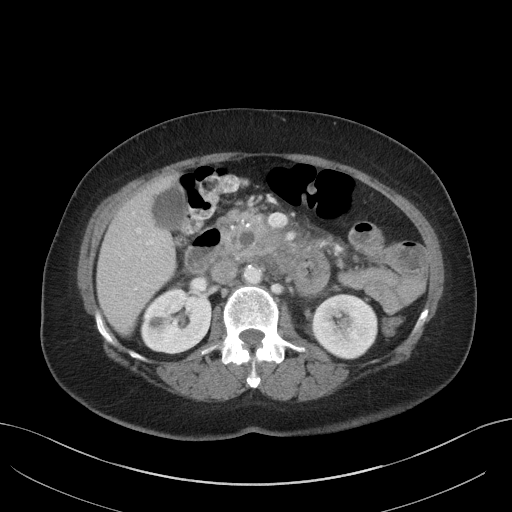
[im 68/99  soft-tissue]
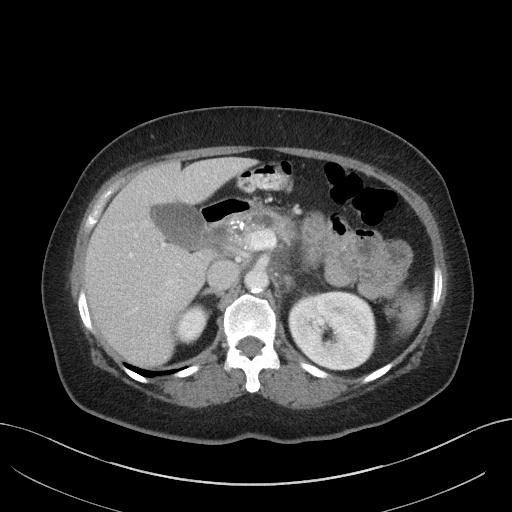
[im 68/99  bone]
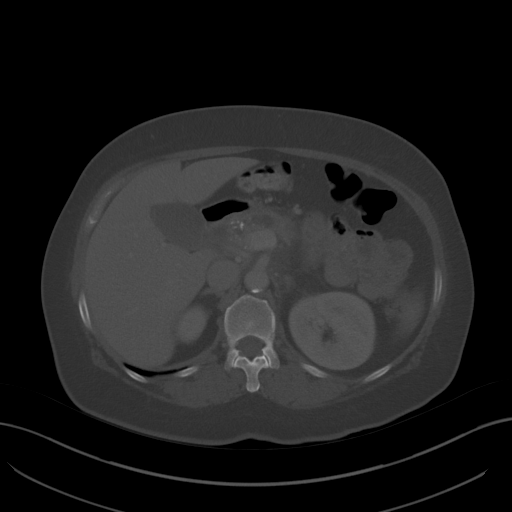
[im 76/99  soft-tissue]
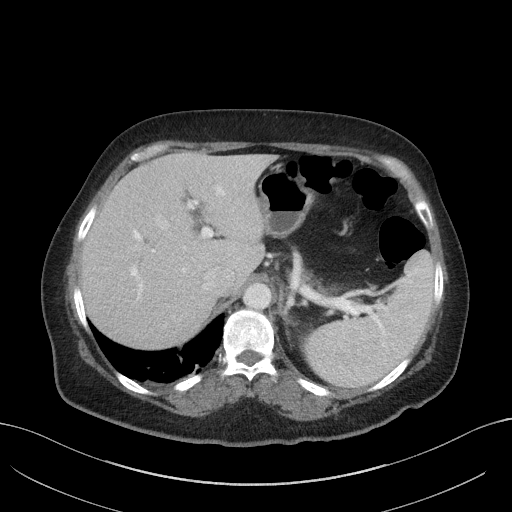
[im 83/99  soft-tissue]
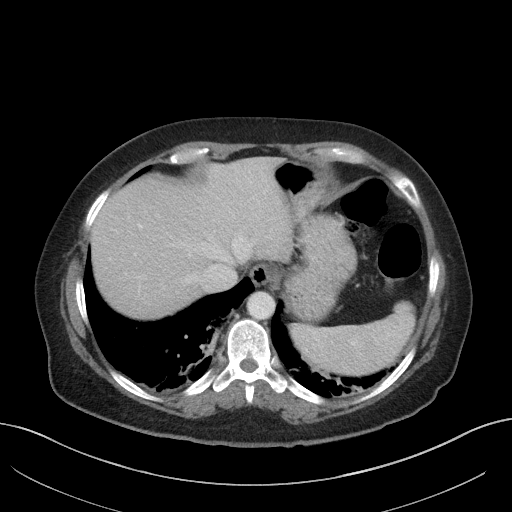
[im 91/99  soft-tissue]
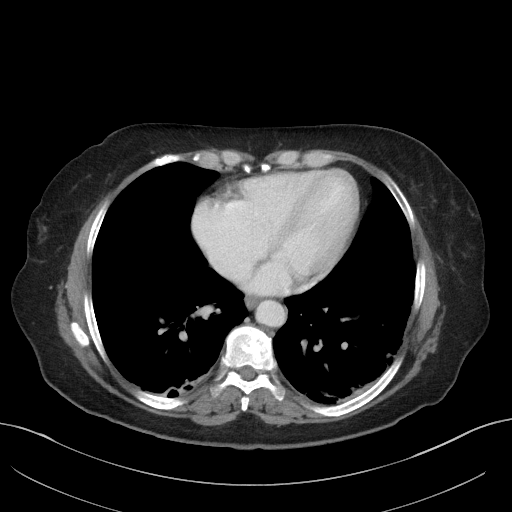

[Series 5: a/p w/ cor · coronal · 0.73mm/px · 3 of 131 slices shown]
[im 44/131  soft-tissue]
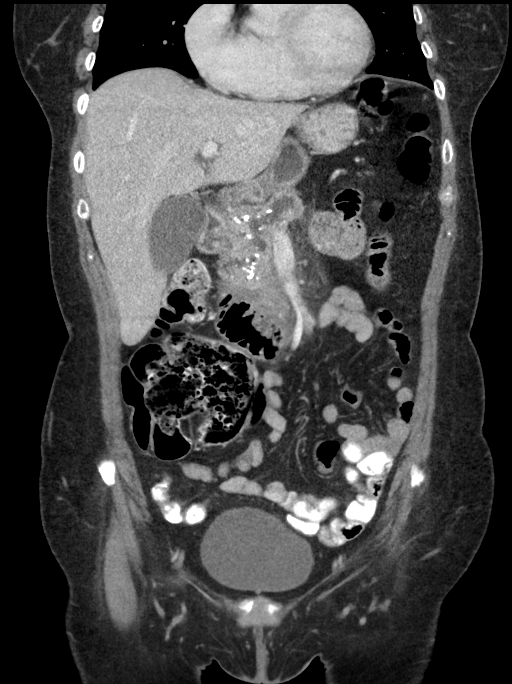
[im 58/131  soft-tissue]
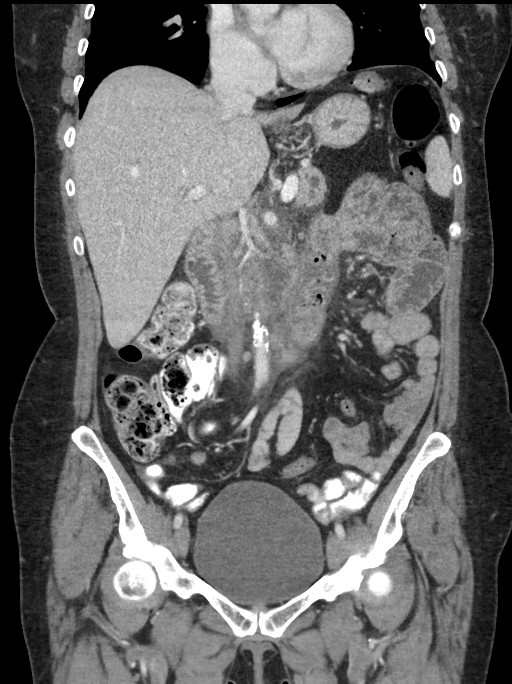
[im 73/131  soft-tissue]
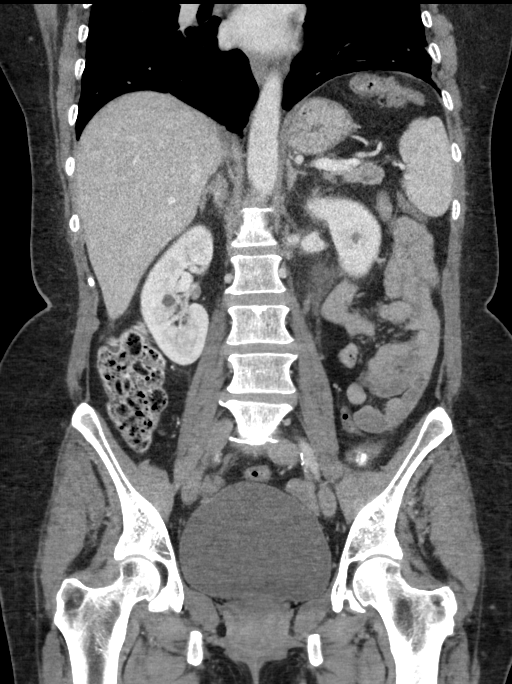

[15 of 46 positions shown; findings below may reference images not displayed]

FINDINGS: Lower chest: Right middle lobe 3 mm pulmonary nodule (series 3/
image 24). Centrilobular emphysema at both lung bases. Subsegmental
platelike atelectasis in both lower lobes.

Hepatobiliary: Normal liver size. Three scattered subcentimeter
hypodense liver foci are too small to characterize. Normal
gallbladder with no radiopaque cholelithiasis. No biliary ductal
dilatation. Common bile duct diameter 5 mm.

Pancreas: Main pancreatic duct is diffusely irregular and dilated
with diameter 9 mm. There are coarse calcifications throughout the
pancreatic head and neck, which are probably both parenchymal and
intraductal including a dominant 13 mm calcification in the
pancreatic head, which is probably located within the dorsal
pancreatic duct. There is diffuse thickening of the pancreatic head
and neck with associated prominent peripancreatic fat stranding.
These findings are in keeping with acute on chronic pancreatitis. No
focal peripancreatic fluid collection. No pancreatic parenchymal gas
or areas of pancreatic parenchymal nonenhancement. No pancreatic
mass.

Spleen: Normal size. No mass.

Adrenals/Urinary Tract: Normal adrenals. Subcentimeter hypodense
renal cortical foci in both kidneys are too small to characterize.
No hydronephrosis. Normal bladder.

Stomach/Bowel: Grossly normal stomach. There is reactive wall
thickening in the third and fourth portions of the duodenum.
Otherwise normal caliber small bowel with no small bowel wall
thickening. Normal appendix. No large bowel wall thickening,
diverticulosis or pericolonic fat stranding. Moderate stool in the
proximal colon.

Vascular/Lymphatic: Atherosclerotic nonaneurysmal abdominal aorta.
Patent portal, splenic, hepatic and renal veins. No pathologically
enlarged lymph nodes in the abdomen or pelvis.

Reproductive: Grossly normal uterus.  No adnexal mass.

Other: No pneumoperitoneum, ascites or focal fluid collection.

Musculoskeletal: No aggressive appearing focal osseous lesions.
Moderate degenerative changes in the visualized thoracolumbar spine.
IMPRESSION: 1. Acute non-necrotizing pancreatitis of the pancreatic head and
neck superimposed on chronic pancreatitis, with intraductal
calcifications in the dilated irregular main pancreatic duct. No
biliary ductal dilatation.
2. Reactive wall thickening in the distal duodenum.
3. Aortic atherosclerosis .
4. Right middle lobe 3 mm pulmonary nodule. No follow-up needed if
patient is low-risk. Non-contrast chest CT can be considered in 12
months if patient is high-risk. This recommendation follows the
consensus statement: Guidelines for Management of Incidental
Pulmonary Nodules Detected on CT Images:From the [HOSPITAL]
3173; published online before print (10.1148/radiol.4219191925).
5. Centrilobular emphysema and atelectasis at the lung bases.

## 2018-03-29 ENCOUNTER — Ambulatory Visit: Payer: Self-pay | Admitting: Primary Care

## 2018-03-29 DIAGNOSIS — Z0289 Encounter for other administrative examinations: Secondary | ICD-10-CM

## 2018-06-22 ENCOUNTER — Ambulatory Visit (INDEPENDENT_AMBULATORY_CARE_PROVIDER_SITE_OTHER): Payer: Medicaid Other | Admitting: Primary Care

## 2018-06-22 ENCOUNTER — Encounter: Payer: Self-pay | Admitting: Primary Care

## 2018-06-22 VITALS — BP 122/86 | HR 78 | Temp 97.8°F | Ht 64.0 in | Wt 166.2 lb

## 2018-06-22 DIAGNOSIS — F331 Major depressive disorder, recurrent, moderate: Secondary | ICD-10-CM

## 2018-06-22 DIAGNOSIS — K861 Other chronic pancreatitis: Secondary | ICD-10-CM | POA: Diagnosis not present

## 2018-06-22 DIAGNOSIS — Z720 Tobacco use: Secondary | ICD-10-CM | POA: Diagnosis not present

## 2018-06-22 DIAGNOSIS — Z09 Encounter for follow-up examination after completed treatment for conditions other than malignant neoplasm: Secondary | ICD-10-CM | POA: Diagnosis not present

## 2018-06-22 DIAGNOSIS — K219 Gastro-esophageal reflux disease without esophagitis: Secondary | ICD-10-CM | POA: Insufficient documentation

## 2018-06-22 LAB — LIPASE: LIPASE: 194 U/L — AB (ref 11.0–59.0)

## 2018-06-22 MED ORDER — OMEPRAZOLE 40 MG PO CPDR: 40.0000 mg | DELAYED_RELEASE_CAPSULE | Freq: Every day | ORAL | 3 refills | Status: DC

## 2018-06-22 MED ORDER — BUPROPION HCL ER (SR) 150 MG PO TB12: 150.0000 mg | ORAL_TABLET | Freq: Two times a day (BID) | ORAL | 3 refills | Status: DC

## 2018-06-22 MED ORDER — ONDANSETRON HCL 4 MG PO TABS: ORAL_TABLET | ORAL | 0 refills | Status: DC

## 2018-06-22 MED ORDER — GABAPENTIN 300 MG PO CAPS: 300.0000 mg | ORAL_CAPSULE | Freq: Two times a day (BID) | ORAL | 0 refills | Status: DC

## 2018-06-22 NOTE — Assessment & Plan Note (Signed)
Doing well on Wellbutrin twice daily, continue same.  Denies SI/HI.  Refills sent to pharmacy.

## 2018-06-22 NOTE — Assessment & Plan Note (Addendum)
Admitted for acute on chronic pancreatitis [facility].  Notes and labs reviewed from her hospital stay via care everywhere.  Repeat lipase today.  Continue Creon as prescribed.  Will titrate gabapentin to 300 mg twice daily for now.  Discussed that she may also add in 300 mg at bedtime if needed.  She will update.  Exam today stable.  She will follow-up with GI next week for upper endoscopy.

## 2018-06-22 NOTE — Progress Notes (Signed)
Subjective:    Patient ID: Anita Poole, female    DOB: November 18, 1965, 52 y.o.   MRN: [id number]  HPI  Ms. Anita Poole is a 52 year old female who presents today for hospital follow up. She is also needing medication refills.  She has not been seen in our office since April 2018.  [facility] was [facility] for a chief complaint of one month worsening epigastric pain with nausea and decreased oral intake. She felt her symptoms were similar to prior episodes of acute pancreatitis. Lipase was 323 on admission.   After further investigation her acute pancreatitis was felt to be secondary to decreased intake of her Creon. She had been limiting her doses given the cost, and ran out of her medication. During her hospital stay she improved. She was treated with IV fluids and pain medication. Pharmacy was able to get her a discount on her Creon which is more affordable. Gabapentin was added to her regimen for chronic pain. She was discharged home on 06/11/18 with recommendations for PCP follow up and GI follow up for outpatient endoscopy.  Since her discharge home she is complaint to her Creon 3-4 times daily. She's had some nausea and mild pain since her hospital stay, overall improved. She is scheduled for endoscopy Monday next week. She is taking gabapentin 300 once daily in the morning (9 am) with some improvement, but will notice that it will wear off as she will notice her dull pain return around 1pm and last for the remainder of the day.   She denies diarrhea.  She is constipated which is a side effect of her Creon.  She denies vomiting.  Review of Systems  Constitutional: Negative for fever.  Gastrointestinal: Positive for abdominal pain, constipation and nausea. Negative for diarrhea and vomiting.       Past Medical History:  Diagnosis Date  . Chronic pancreatitis (HCC)   . Depression   . GERD (gastroesophageal reflux disease)   . Medical history non-contributory      Social  History   Socioeconomic History  . Marital status: Single    Spouse name: Not on file  . Number of children: Not on file  . Years of education: Not on file  . Highest education level: Not on file  Occupational History  . Not on file  Social Needs  . Financial resource strain: Not on file  . Food insecurity:    Worry: Not on file    Inability: Not on file  . Transportation needs:    Medical: Not on file    Non-medical: Not on file  Tobacco Use  . Smoking status: Current Every Day Smoker    Packs/day: 0.30    Years: 30.00    Pack years: 9.00    Types: Cigarettes  . Smokeless tobacco: Never Used  Substance and Sexual Activity  . Alcohol use: No  . Drug use: No  . Sexual activity: Never    Birth control/protection: None  Lifestyle  . Physical activity:    Days per week: Not on file    Minutes per session: Not on file  . Stress: Not on file  Relationships  . Social connections:    Talks on phone: Not on file    Gets together: Not on file    Attends religious service: Not on file    Active member of club or organization: Not on file    Attends meetings of clubs or organizations: Not on file  Relationship status: Not on file  . Intimate partner violence:    Fear of current or ex partner: Not on file    Emotionally abused: Not on file    Physically abused: Not on file    Forced sexual activity: Not on file  Other Topics Concern  . Not on file  Social History Narrative   Single.   Moved from [place].   Works at [employer].   Enjoys spending time with family.    Past Surgical History:  Procedure Laterality Date  . BREAST REDUCTION SURGERY    . CESAREAN SECTION    . SHOULDER SURGERY Left     Family History  Problem Relation Age of Onset  . Diabetes Mother   . Kidney failure Mother   . Hypertension Mother   . Coronary artery disease Mother   . Heart disease Mother   . Lung cancer Father     No Known Allergies  Current Outpatient Medications on File  Prior to Visit  Medication Sig Dispense Refill  . Pancrelipase, Lip-Prot-Amyl, 24000-76000 units CPEP Take 2 capsule with meals ( 1 after beginning,1 when finished eating). Take 1 capsule with snack (immediately after beginning) twice daily. 720 capsule 0   No current facility-administered medications on file prior to visit.     BP 122/86   Pulse 78   Temp 97.8 F (36.6 C) (Oral)   Ht 5\' 4"  (1.626 m)   Wt 166 lb 4 oz (75.4 kg)   SpO2 98%   BMI 28.54 kg/m    Objective:   Physical Exam  Constitutional: She appears well-nourished.  Cardiovascular: Normal rate and regular rhythm.  Respiratory: Effort normal and breath sounds normal.  GI: Soft. Normal appearance and bowel sounds are normal. There is tenderness in the right upper quadrant, epigastric area and left upper quadrant.    Skin: Skin is warm and dry.           Assessment & Plan:

## 2018-06-22 NOTE — Assessment & Plan Note (Signed)
Quit smoking with assistance of Wellbutrin.  Commended her on this success.  Continue same.

## 2018-06-22 NOTE — Patient Instructions (Addendum)
We've increased your gabapentin 300 mg. Take 1 tablet in the morning and add another tablet around 12 pm. Do this for a week or two. It's okay to add another tablet at bedtime if needed. Please update me.  Stop by the lab prior to leaving today. I will notify you of your results once received.   Follow up with GI as scheduled next week.   It was a pleasure to see you today!

## 2018-06-22 NOTE — Assessment & Plan Note (Signed)
Doing well on omeprazole 40 mg.  Refill sent to pharmacy.

## 2018-09-06 ENCOUNTER — Telehealth: Payer: Self-pay

## 2018-09-06 NOTE — Telephone Encounter (Signed)
Pt left v/m stating that the gabapentin was not helping pancreatitis pain. Pt requesting different med for pain. I left pt v/m requesting cb to see how much gabapentin 300 mg pt is taking; pt last seen 06/22/18 and pt could increase gabapentin 300 mg to twice a day and at hs as needed.FYI to [place] CMA.

## 2018-09-06 NOTE — Telephone Encounter (Signed)
I think it's best to start by notifying her GI doctor about this pain she's having.  We need to make sure they don't want further information about her increased pain. We can increase gabapentin to 600 mg TID if needed but I recommend she start with her GI doctor first. Have her call their office.

## 2018-09-06 NOTE — Telephone Encounter (Signed)
Pt said Dr Francesca Oman started pt on Gabapentin but when pt called [facility] Dr Sharene Butters is a resident in internal med and they could not reach Dr Sharene Butters. Pt wants to know if Allayne Gitelman NP can increase Gabapentin. Pt request cb.

## 2018-09-06 NOTE — Telephone Encounter (Signed)
Pt called back and pt is taking gabapentin 300 mg bid and hs and taking an additional Extra strength tylenol when took the gabapentin over weekend. Pt is having upper abd pain that goes thru to rt side of back. Pain level now 7-8. This past weekend pain level 10+.Pt wants to know if can get different med for pain. [facility].pt request cb.

## 2018-09-06 NOTE — Telephone Encounter (Signed)
Spoken and notified patient of Anita Poole's comments. Patient verbalized understanding.  

## 2018-09-06 NOTE — Telephone Encounter (Signed)
Spoke with patient regarding symptoms, she has been taking 300 mg 2-3 times daily. Recent pain has been constant and increased over the last couple of days, she did contact GI who did not provide any recommendations.  We discussed increasing her gabapentin to 600 mg 1-2 times daily advancing slowly.  She verbalized understanding and will update [place] within a couple of days.  Emergency department precautions provided.

## 2018-09-13 ENCOUNTER — Other Ambulatory Visit: Payer: Self-pay | Admitting: Primary Care

## 2018-09-13 DIAGNOSIS — K861 Other chronic pancreatitis: Secondary | ICD-10-CM

## 2018-09-14 NOTE — Telephone Encounter (Signed)
Last prescribed on 06/22/2018  . Last office visit on 06/22/2018. No future appointment

## 2018-09-15 NOTE — Telephone Encounter (Signed)
Spoken to patient and she is better, there is still pain but much better. She stated that she is taking 600 mg BID.

## 2018-09-15 NOTE — Telephone Encounter (Signed)
How is she doing on the increased dose of gabapentin? How much is she taking and how often?

## 2018-09-15 NOTE — Telephone Encounter (Signed)
Noted, refill sent to pharmacy. 

## 2018-12-08 ENCOUNTER — Other Ambulatory Visit: Payer: Self-pay | Admitting: Primary Care

## 2018-12-08 DIAGNOSIS — K861 Other chronic pancreatitis: Secondary | ICD-10-CM

## 2018-12-14 ENCOUNTER — Other Ambulatory Visit: Payer: Self-pay | Admitting: Primary Care

## 2018-12-14 DIAGNOSIS — K219 Gastro-esophageal reflux disease without esophagitis: Secondary | ICD-10-CM

## 2018-12-15 MED ORDER — FAMOTIDINE 20 MG PO TABS: ORAL_TABLET | ORAL | 0 refills | Status: DC

## 2018-12-15 NOTE — Telephone Encounter (Signed)
Spoken to patient and notified her that insurance will not cover omeprazole 40 mg . She stated let go ahead change to pepcid and see if this works. If not then she will call back and ask for different medication or prior auth

## 2018-12-15 NOTE — Telephone Encounter (Signed)
Noted, Rx for Pepcid 20 mg sent to pharmacy.

## 2019-03-01 ENCOUNTER — Other Ambulatory Visit: Payer: Self-pay | Admitting: Primary Care

## 2019-03-01 DIAGNOSIS — K861 Other chronic pancreatitis: Secondary | ICD-10-CM

## 2019-03-01 NOTE — Telephone Encounter (Signed)
Last prescribed on 12/08/2018. Last appointment on 06/22/2018. No future appointment

## 2019-03-02 ENCOUNTER — Other Ambulatory Visit: Payer: Self-pay | Admitting: Primary Care

## 2019-03-02 DIAGNOSIS — K219 Gastro-esophageal reflux disease without esophagitis: Secondary | ICD-10-CM

## 2019-03-02 NOTE — Telephone Encounter (Signed)
Noted.  Refill sent to pharmacy. 

## 2019-03-07 ENCOUNTER — Other Ambulatory Visit: Payer: Self-pay

## 2019-03-07 DIAGNOSIS — Z20822 Contact with and (suspected) exposure to covid-19: Secondary | ICD-10-CM

## 2019-03-08 LAB — NOVEL CORONAVIRUS, NAA: SARS-CoV-2, NAA: NOT DETECTED

## 2019-04-12 ENCOUNTER — Other Ambulatory Visit: Payer: Self-pay | Admitting: Primary Care

## 2019-04-12 DIAGNOSIS — K861 Other chronic pancreatitis: Secondary | ICD-10-CM

## 2019-04-13 ENCOUNTER — Other Ambulatory Visit: Payer: Self-pay | Admitting: Primary Care

## 2019-04-13 DIAGNOSIS — K861 Other chronic pancreatitis: Secondary | ICD-10-CM

## 2019-07-07 ENCOUNTER — Other Ambulatory Visit: Payer: Self-pay | Admitting: Primary Care

## 2019-07-07 DIAGNOSIS — F331 Major depressive disorder, recurrent, moderate: Secondary | ICD-10-CM

## 2019-07-07 DIAGNOSIS — Z720 Tobacco use: Secondary | ICD-10-CM

## 2019-07-08 ENCOUNTER — Other Ambulatory Visit: Payer: Self-pay | Admitting: Primary Care

## 2019-07-08 DIAGNOSIS — K861 Other chronic pancreatitis: Secondary | ICD-10-CM

## 2019-07-08 NOTE — Telephone Encounter (Signed)
Patient called and stated she is needing a refill on CREON to help with her digestion. I did not see this on her medication list.  Patient said that the pharmacy is having a hard time sending over a refill request for this.

## 2019-07-08 NOTE — Telephone Encounter (Signed)
Last prescribed on 04/13/2019  . Last appointment on 06/22/2018. No future appointment

## 2019-07-08 NOTE — Telephone Encounter (Signed)
Her creon is filled by her GI doctor, send request to their office. She needs an office visit for refills of anything further as she's not been seen in one year.

## 2019-07-11 NOTE — Telephone Encounter (Signed)
Message left for patient to return my call.  

## 2019-07-19 ENCOUNTER — Other Ambulatory Visit: Payer: Self-pay | Admitting: Primary Care

## 2019-07-19 DIAGNOSIS — Z720 Tobacco use: Secondary | ICD-10-CM

## 2019-07-19 DIAGNOSIS — F331 Major depressive disorder, recurrent, moderate: Secondary | ICD-10-CM

## 2019-07-29 ENCOUNTER — Other Ambulatory Visit: Payer: Self-pay

## 2019-07-29 ENCOUNTER — Ambulatory Visit (INDEPENDENT_AMBULATORY_CARE_PROVIDER_SITE_OTHER): Payer: PRIVATE HEALTH INSURANCE | Admitting: Primary Care

## 2019-07-29 ENCOUNTER — Encounter: Payer: Self-pay | Admitting: Primary Care

## 2019-07-29 VITALS — BP 124/82 | HR 83 | Temp 97.1°F | Ht 64.0 in | Wt 166.5 lb

## 2019-07-29 DIAGNOSIS — E785 Hyperlipidemia, unspecified: Secondary | ICD-10-CM

## 2019-07-29 DIAGNOSIS — K5909 Other constipation: Secondary | ICD-10-CM | POA: Insufficient documentation

## 2019-07-29 DIAGNOSIS — F331 Major depressive disorder, recurrent, moderate: Secondary | ICD-10-CM

## 2019-07-29 DIAGNOSIS — K219 Gastro-esophageal reflux disease without esophagitis: Secondary | ICD-10-CM

## 2019-07-29 DIAGNOSIS — K861 Other chronic pancreatitis: Secondary | ICD-10-CM

## 2019-07-29 DIAGNOSIS — R7303 Prediabetes: Secondary | ICD-10-CM

## 2019-07-29 MED ORDER — ONDANSETRON HCL 4 MG PO TABS: ORAL_TABLET | ORAL | 0 refills | Status: DC

## 2019-07-29 MED ORDER — PANCRELIPASE (LIP-PROT-AMYL) 24000-76000 UNITS PO CPEP: ORAL_CAPSULE | ORAL | 0 refills | Status: DC

## 2019-07-29 NOTE — Assessment & Plan Note (Signed)
Overall doing well on famotidine BID, continue same.

## 2019-07-29 NOTE — Assessment & Plan Note (Signed)
Overall feels well managed on Wellbutrin SR, continue same. She will update if she decides to wean off.

## 2019-07-29 NOTE — Assessment & Plan Note (Addendum)
Sounds to be uncontrolled in terms of symptoms. Continue gabapentin PRN, continue ondansetron PRN. Refill of Creon provided.  Referral placed to GI for further evaluation and management.  Repeat labs today pending including A1C, CBC, CMP, lipids, Lipase.

## 2019-07-29 NOTE — Progress Notes (Signed)
Subjective:    Patient ID: Anita Poole, female    DOB: 23-Jul-1965, 54 y.o.   MRN: [id number]  HPI  This visit occurred during the SARS-CoV-2 public health emergency.  Safety protocols were in place, including screening questions prior to the visit, additional usage of staff PPE, and extensive cleaning of exam room while observing appropriate contact time as indicated for disinfecting solutions.   Anita Poole is a 54 year old female who presents today for follow up of chronic conditions.  1) Chronic pancreatitis: Currently following with GI through [facility] and is managed on pancrelipase 24000-76000. Also managed on gabapentin 600 mg once to twice daily as needed. She takes ondansetron as needed for breakthrough nausea. Her last visit with GI was in July 2020, no concerns during that visit. She would like to establish care with a different GI as she doesn't feel as though she's well managed.   She's concerned about constipation, having bowel movements once weekly on average, sometimes once every two weeks. She's not taken anything OTC for constipation. Also continues to experience breakthrough abdominal pain to the upper abdomen with radiation through her back. She's had increased pain since Thanksgiving 2020.  2) MDD: Currently managed on Wellbutrin SR 12 hr BID. Overall she believes it may be helping, she's not entirely sure. She's been under more pain recently which has caused her to feel down. She would like to continue taking.  Review of Systems  Constitutional: Negative for fever.  Respiratory: Negative for shortness of breath.   Cardiovascular: Negative for chest pain.  Gastrointestinal: Positive for abdominal pain and nausea.  Psychiatric/Behavioral:       See HPI       Past Medical History:  Diagnosis Date  . Chronic pancreatitis ([place])   . Depression   . GERD (gastroesophageal reflux disease)   . Medical history non-contributory      Social History   Socioeconomic  History  . Marital status: Single    Spouse name: Not on file  . Number of children: Not on file  . Years of education: Not on file  . Highest education level: Not on file  Occupational History  . Not on file  Tobacco Use  . Smoking status: Current Some Day Smoker    Packs/day: 0.30    Years: 30.00    Pack years: 9.00    Types: Cigarettes  . Smokeless tobacco: Never Used  Substance and Sexual Activity  . Alcohol use: No  . Drug use: No  . Sexual activity: Never    Birth control/protection: None  Other Topics Concern  . Not on file  Social History Narrative   Single.   Moved from [place].   Works at [employer].   Enjoys spending time with family.   Social Determinants of Health   Financial Resource Strain:   . Difficulty of Paying Living Expenses: Not on file  Food Insecurity:   . Worried About Charity fundraiser in the Last Year: Not on file  . Ran Out of Food in the Last Year: Not on file  Transportation Needs:   . Lack of Transportation (Medical): Not on file  . Lack of Transportation (Non-Medical): Not on file  Physical Activity:   . Days of Exercise per Week: Not on file  . Minutes of Exercise per Session: Not on file  Stress:   . Feeling of Stress : Not on file  Social Connections:   . Frequency of Communication with Friends and Family:  Not on file  . Frequency of Social Gatherings with Friends and Family: Not on file  . Attends Religious Services: Not on file  . Active Member of Clubs or Organizations: Not on file  . Attends Banker Meetings: Not on file  . Marital Status: Not on file  Intimate Partner Violence:   . Fear of Current or Ex-Partner: Not on file  . Emotionally Abused: Not on file  . Physically Abused: Not on file  . Sexually Abused: Not on file    Past Surgical History:  Procedure Laterality Date  . BREAST REDUCTION SURGERY    . CESAREAN SECTION    . SHOULDER SURGERY Left     Family History  Problem Relation Age of  Onset  . Diabetes Mother   . Kidney failure Mother   . Hypertension Mother   . Coronary artery disease Mother   . Heart disease Mother   . Lung cancer Father     No Known Allergies  Current Outpatient Medications on File Prior to Visit  Medication Sig Dispense Refill  . buPROPion (WELLBUTRIN SR) 150 MG 12 hr tablet TAKE 1 TABLET (150 MG TOTAL) BY MOUTH 2 (TWO) TIMES DAILY. FOR DEPRESSION. 60 tablet 0  . famotidine (PEPCID) 20 MG tablet TAKE 1 TABLET BY MOUTH ONCE TO TWICE DAILY FOR HEARTBURN. 180 tablet 1  . gabapentin (NEURONTIN) 300 MG capsule TAKE 2 CAPSULES (600 MG TOTAL) BY MOUTH 2 (TWO) TIMES DAILY. FOR PAIN. 360 capsule 0   No current facility-administered medications on file prior to visit.    BP 124/82   Pulse 83   Temp (!) 97.1 F (36.2 C) (Temporal)   Ht 5\' 4"  (1.626 m)   Wt 166 lb 8 oz (75.5 kg)   SpO2 97%   BMI 28.58 kg/m    Objective:   Physical Exam  Constitutional: She appears well-nourished.  Cardiovascular: Normal rate and regular rhythm.  Respiratory: Effort normal and breath sounds normal.  GI: Soft. Normal appearance. There is abdominal tenderness in the right upper quadrant, epigastric area and left upper quadrant. There is no guarding.    Musculoskeletal:     Cervical back: Neck supple.  Skin: Skin is warm and dry.  Psychiatric: She has a normal mood and affect.           Assessment & Plan:

## 2019-07-29 NOTE — Assessment & Plan Note (Signed)
Discussed to start with Colace 100 mg once to twice daily, she will update. Referral to GI placed.

## 2019-07-29 NOTE — Assessment & Plan Note (Signed)
Repeat A1C pending. 

## 2019-07-29 NOTE — Assessment & Plan Note (Signed)
Repeat lipids pending.  

## 2019-07-29 NOTE — Patient Instructions (Signed)
Stop by the lab prior to leaving today. I will notify you of your results once received.   You will be contacted regarding your referral to GI.  Please let us know if you have not been contacted within two weeks.   It was a pleasure to see you today!  

## 2019-07-30 LAB — COMPREHENSIVE METABOLIC PANEL
AG Ratio: 1.6 (calc) (ref 1.0–2.5)
ALT: 16 U/L (ref 6–29)
AST: 14 U/L (ref 10–35)
Albumin: 3.9 g/dL (ref 3.6–5.1)
Alkaline phosphatase (APISO): 137 U/L (ref 37–153)
BUN: 13 mg/dL (ref 7–25)
CO2: 24 mmol/L (ref 20–32)
Calcium: 9.9 mg/dL (ref 8.6–10.4)
Chloride: 108 mmol/L (ref 98–110)
Creat: 0.71 mg/dL (ref 0.50–1.05)
Globulin: 2.4 g/dL (calc) (ref 1.9–3.7)
Glucose, Bld: 145 mg/dL — ABNORMAL HIGH (ref 65–99)
Potassium: 4.2 mmol/L (ref 3.5–5.3)
Sodium: 140 mmol/L (ref 135–146)
Total Bilirubin: 0.5 mg/dL (ref 0.2–1.2)
Total Protein: 6.3 g/dL (ref 6.1–8.1)

## 2019-07-30 LAB — LIPID PANEL
Cholesterol: 207 mg/dL — ABNORMAL HIGH (ref <200)
HDL: 39 mg/dL — ABNORMAL LOW (ref >=50)
LDL Cholesterol (Calc): 142 mg/dL (calc) — ABNORMAL HIGH
Non-HDL Cholesterol (Calc): 168 mg/dL (calc) — ABNORMAL HIGH (ref <130)
Total CHOL/HDL Ratio: 5.3 (calc) — ABNORMAL HIGH (ref <5.0)
Triglycerides: 134 mg/dL (ref <150)

## 2019-07-30 LAB — CBC WITH DIFFERENTIAL/PLATELET
Absolute Monocytes: 308 cells/uL (ref 200–950)
Basophils Absolute: 54 cells/uL (ref 0–200)
Basophils Relative: 0.7 %
Eosinophils Absolute: 123 cells/uL (ref 15–500)
Eosinophils Relative: 1.6 %
HCT: 40 % (ref 35.0–45.0)
Hemoglobin: 14 g/dL (ref 11.7–15.5)
Lymphs Abs: 1825 cells/uL (ref 850–3900)
MCH: 31.6 pg (ref 27.0–33.0)
MCHC: 35 g/dL (ref 32.0–36.0)
MCV: 90.3 fL (ref 80.0–100.0)
MPV: 10.5 fL (ref 7.5–12.5)
Monocytes Relative: 4 %
Neutro Abs: 5390 cells/uL (ref 1500–7800)
Neutrophils Relative %: 70 %
Platelets: 216 10*3/uL (ref 140–400)
RBC: 4.43 10*6/uL (ref 3.80–5.10)
RDW: 13 % (ref 11.0–15.0)
Total Lymphocyte: 23.7 %
WBC: 7.7 10*3/uL (ref 3.8–10.8)

## 2019-07-30 LAB — HEMOGLOBIN A1C
Hgb A1c MFr Bld: 5.7 % of total Hgb — ABNORMAL HIGH (ref <5.7)
Mean Plasma Glucose: 117 (calc)
eAG (mmol/L): 6.5 (calc)

## 2019-07-30 LAB — LIPASE: Lipase: 143 U/L — ABNORMAL HIGH (ref 7–60)

## 2019-08-04 ENCOUNTER — Encounter: Payer: Self-pay | Admitting: *Deleted

## 2019-08-31 ENCOUNTER — Ambulatory Visit: Payer: No Typology Code available for payment source | Admitting: Gastroenterology

## 2019-10-18 ENCOUNTER — Other Ambulatory Visit: Payer: Self-pay

## 2019-10-18 ENCOUNTER — Ambulatory Visit (INDEPENDENT_AMBULATORY_CARE_PROVIDER_SITE_OTHER): Payer: PRIVATE HEALTH INSURANCE | Admitting: Gastroenterology

## 2019-10-18 VITALS — BP 141/95 | HR 69 | Temp 97.9°F | Ht 64.0 in | Wt 163.6 lb

## 2019-10-18 DIAGNOSIS — G8929 Other chronic pain: Secondary | ICD-10-CM | POA: Diagnosis not present

## 2019-10-18 DIAGNOSIS — K5909 Other constipation: Secondary | ICD-10-CM | POA: Diagnosis not present

## 2019-10-18 DIAGNOSIS — K861 Other chronic pancreatitis: Secondary | ICD-10-CM

## 2019-10-18 DIAGNOSIS — R1013 Epigastric pain: Secondary | ICD-10-CM

## 2019-10-18 NOTE — Progress Notes (Signed)
Anita Bellows MD, MRCP(U.K) [address]  [employer]  [name], [place] [address]  Main: [phone]  Fax: [phone]   Gastroenterology Consultation  Referring Provider:     [name], NP Primary Care Physician:  Anita Koch, NP Primary Gastroenterologist:  Dr. Jonathon Poole  Reason for Consultation:     Chronic pancreatitis        HPI:   Anita Poole is a 54 y.o. y/o female referred for consultation & management  by  Anita Koch, NP.   She has a history of chronic calcific pancreatitis of unknown etiology.  Prior notes reviewed and summarized has follow-up as late as July 2020 with [facility].  Reviewing the notes it was found that she had an EUS to reassess her pancreas in January 2020 but canceled the appointment due to financial issues.  She has dilation of the main pancreatic duct at that point of time there was some concern that they were unable to extract stones in the main pancreatic duct.  She did not meet criteria for surgical intervention at that point of time.  The point of time follow-up was scheduled for a year and plan was for an outpatient EUS.  Last GI evaluation was in 2018.  07/29/2019: Glucose 145 creatinine 0.71 LFTs normal.  Lipase 143 triglycerides 134.  Hemoglobin 14 g.  View of epic 02/07/2016: CT scan of the abdomen: Acute nonnecrotizing pancreatitis of the pancreatic head and neck superimposed on chronic pancreatitis with intraductal calcification in the dilated irregular main pancreatic duct. 2019: Right upper quadrant ultrasound: Chronic calcific pancreatitis.  Dilation and irregularity of the main pancreatic duct up to 10 mm in the body of the pancreas.  Mild dilation of the extrahepatic common bile duct measuring up to 10 mm without any filling defect.  She states that she suffers from chronic abdominal pain no better no worse than what it was over the.  Of time.  She still smokes has not completely stopped.  Denies any alcohol intake.   No family history of chronic pancreatitis or pancreatic cancer.  Not on any narcotics.  Takes famotidine daily.  Suffers from constipation rather than diarrhea. Past Medical History:  Diagnosis Date  . Chronic pancreatitis ([place])   . Depression   . GERD (gastroesophageal reflux disease)   . Medical history non-contributory     Past Surgical History:  Procedure Laterality Date  . BREAST REDUCTION SURGERY    . CESAREAN SECTION    . SHOULDER SURGERY Left     Prior to Admission medications   Medication Sig Start Date End Date Taking? Authorizing Provider  buPROPion (WELLBUTRIN SR) 150 MG 12 hr tablet TAKE 1 TABLET (150 MG TOTAL) BY MOUTH 2 (TWO) TIMES DAILY. FOR DEPRESSION. 07/07/19   Anita Koch, NP  famotidine (PEPCID) 20 MG tablet TAKE 1 TABLET BY MOUTH ONCE TO TWICE DAILY FOR HEARTBURN. 03/03/19   Anita Koch, NP  gabapentin (NEURONTIN) 300 MG capsule TAKE 2 CAPSULES (600 MG TOTAL) BY MOUTH 2 (TWO) TIMES DAILY. FOR PAIN. 04/13/19   Anita Koch, NP  ondansetron (ZOFRAN) 4 MG tablet TAKE 1 TABLET BY MOUTH EVERY 6 HOURS AS NEEDED FOR NAUSEA AND VOMITING. QTY/DAY SUPPLY PER INSURANCE 07/29/19   Anita Koch, NP  Pancrelipase, Lip-Prot-Amyl, 24000-76000 units CPEP Take 2 capsule with meals ( 1 after beginning,1 when finished eating). Take 1 capsule with snack (immediately after beginning) twice daily. 07/29/19   Anita Koch, NP  Family History  Problem Relation Age of Onset  . Diabetes Mother   . Kidney failure Mother   . Hypertension Mother   . Coronary artery disease Mother   . Heart disease Mother   . Lung cancer Father      Social History   Tobacco Use  . Smoking status: Current Some Day Smoker    Packs/day: 0.30    Years: 30.00    Pack years: 9.00    Types: Cigarettes  . Smokeless tobacco: Never Used  Substance Use Topics  . Alcohol use: No  . Drug use: No    Allergies as of 10/18/2019  . (No Known Allergies)    Review of Systems:      All systems reviewed and negative except where noted in HPI.   Physical Exam:  There were no vitals taken for this visit. No LMP recorded. Patient is postmenopausal. Psych:  Alert and cooperative. Normal Poole and affect. General:   Alert,  Well-developed, well-nourished, pleasant and cooperative in NAD Head:  Normocephalic and atraumatic. Abdomen:  Normal bowel sounds.  No bruits.  Soft, non-tender and non-distended without masses, hepatosplenomegaly or hernias noted.  No guarding or rebound tenderness.    Neurologic:  Alert and oriented x3;  grossly normal neurologically. Psych:  Alert and cooperative. Normal Poole and affect.  Imaging Studies: No results found.  Assessment and Plan:   Anita Poole is a 54 y.o. y/o female has been referred for evaluation of chronic pancreatitis.  She has been followed at [facility] between 2018 and 2020 including seeing a surgeon for possible consideration of surgery.  She has dilation of the main pancreatic duct and inability in the past to extract stones in the pancreatic duct.  She also has coexisting dilation of the common bile duct but liver tests and transaminases have been normal most recently.  Plan 1.  In terms of long-term management for chronic pancreatitis suggest to stop all alcohol/tobacco and eat small meals low in fat.  Can use pancreatic supplement enzymes.  Continue acid suppression..  Refer to [facility] to determine if pancreatic Endo-therapy is required to clear the pancreatic duct of stones.  Previously attempted and failed.   2.  Check IgG4 levels, celiac serology, immunoglobulin levels. 3.  She is on Creon continue 4.  Commence on MiraLAX 1 capful twice daily for constipation 5.  Check H. pylori breath test.  Follow up in 6 months  Dr Anita Mood MD,MRCP(U.K)

## 2019-10-20 ENCOUNTER — Encounter: Payer: Self-pay | Admitting: Gastroenterology

## 2019-10-21 ENCOUNTER — Other Ambulatory Visit: Payer: Self-pay | Admitting: Primary Care

## 2019-10-21 DIAGNOSIS — K219 Gastro-esophageal reflux disease without esophagitis: Secondary | ICD-10-CM

## 2019-10-21 LAB — H. PYLORI BREATH TEST: H pylori Breath Test: NEGATIVE

## 2019-10-21 LAB — IGG 4: IgG, Subclass 4: 18 mg/dL (ref 2–96)

## 2019-10-21 LAB — CELIAC DISEASE AB SCREEN W/RFX
Antigliadin Abs, IgA: 3 units (ref 0–19)
Transglutaminase IgA: <2 U/mL (ref 0–3)

## 2019-10-21 LAB — IMMUNOGLOBULINS A/E/G/M, SERUM
IgA/Immunoglobulin A, Serum: 147 mg/dL (ref 87–352)
IgE (Immunoglobulin E), Serum: <2 IU/mL — ABNORMAL LOW (ref 6–495)
IgG (Immunoglobin G), Serum: 850 mg/dL (ref 586–1602)
IgM (Immunoglobulin M), Srm: 111 mg/dL (ref 26–217)

## 2019-11-25 ENCOUNTER — Other Ambulatory Visit: Payer: Self-pay | Admitting: Primary Care

## 2019-11-25 DIAGNOSIS — K861 Other chronic pancreatitis: Secondary | ICD-10-CM

## 2019-12-08 ENCOUNTER — Other Ambulatory Visit: Payer: Self-pay | Admitting: Primary Care

## 2019-12-08 DIAGNOSIS — K861 Other chronic pancreatitis: Secondary | ICD-10-CM

## 2019-12-09 NOTE — Telephone Encounter (Signed)
Refill sent to pharmacy.   

## 2019-12-09 NOTE — Telephone Encounter (Signed)
Last prescribed on 04/13/2019. Last OV on 07/29/2019. Next future lab appt scheduled on 02/03/2020

## 2020-01-06 ENCOUNTER — Encounter (HOSPITAL_COMMUNITY): Payer: Self-pay

## 2020-01-06 ENCOUNTER — Other Ambulatory Visit: Payer: Self-pay

## 2020-01-06 ENCOUNTER — Emergency Department (HOSPITAL_COMMUNITY): Payer: PRIVATE HEALTH INSURANCE

## 2020-01-06 ENCOUNTER — Telehealth (INDEPENDENT_AMBULATORY_CARE_PROVIDER_SITE_OTHER): Payer: PRIVATE HEALTH INSURANCE | Admitting: Family Medicine

## 2020-01-06 ENCOUNTER — Encounter: Payer: Self-pay | Admitting: Family Medicine

## 2020-01-06 ENCOUNTER — Inpatient Hospital Stay (HOSPITAL_COMMUNITY)
Admission: EM | Admit: 2020-01-06 | Discharge: 2020-01-13 | DRG: 442 | Disposition: A | Discharge status: Home Health | Payer: PRIVATE HEALTH INSURANCE | Attending: Internal Medicine | Admitting: Internal Medicine

## 2020-01-06 ENCOUNTER — Ambulatory Visit (INDEPENDENT_AMBULATORY_CARE_PROVIDER_SITE_OTHER)
Admission: EM | Admit: 2020-01-06 | Discharge: 2020-01-06 | Disposition: A | Discharge status: Other Acute Inpatient Hospital | Payer: PRIVATE HEALTH INSURANCE | Source: Home / Self Care

## 2020-01-06 VITALS — Temp 102.8°F | Ht 64.0 in

## 2020-01-06 DIAGNOSIS — Z79899 Other long term (current) drug therapy: Secondary | ICD-10-CM | POA: Insufficient documentation

## 2020-01-06 DIAGNOSIS — K861 Other chronic pancreatitis: Secondary | ICD-10-CM

## 2020-01-06 DIAGNOSIS — F17210 Nicotine dependence, cigarettes, uncomplicated: Secondary | ICD-10-CM | POA: Insufficient documentation

## 2020-01-06 DIAGNOSIS — F329 Major depressive disorder, single episode, unspecified: Secondary | ICD-10-CM | POA: Diagnosis present

## 2020-01-06 DIAGNOSIS — K5909 Other constipation: Secondary | ICD-10-CM | POA: Insufficient documentation

## 2020-01-06 DIAGNOSIS — Z841 Family history of disorders of kidney and ureter: Secondary | ICD-10-CM

## 2020-01-06 DIAGNOSIS — R509 Fever, unspecified: Secondary | ICD-10-CM | POA: Diagnosis not present

## 2020-01-06 DIAGNOSIS — R1011 Right upper quadrant pain: Secondary | ICD-10-CM | POA: Insufficient documentation

## 2020-01-06 DIAGNOSIS — K8309 Other cholangitis: Secondary | ICD-10-CM | POA: Diagnosis not present

## 2020-01-06 DIAGNOSIS — K219 Gastro-esophageal reflux disease without esophagitis: Secondary | ICD-10-CM | POA: Diagnosis present

## 2020-01-06 DIAGNOSIS — K828 Other specified diseases of gallbladder: Secondary | ICD-10-CM | POA: Diagnosis present

## 2020-01-06 DIAGNOSIS — K750 Abscess of liver: Secondary | ICD-10-CM | POA: Diagnosis not present

## 2020-01-06 DIAGNOSIS — Z20822 Contact with and (suspected) exposure to covid-19: Secondary | ICD-10-CM | POA: Insufficient documentation

## 2020-01-06 DIAGNOSIS — M25511 Pain in right shoulder: Secondary | ICD-10-CM | POA: Insufficient documentation

## 2020-01-06 DIAGNOSIS — K7689 Other specified diseases of liver: Secondary | ICD-10-CM | POA: Diagnosis present

## 2020-01-06 DIAGNOSIS — R8271 Bacteriuria: Secondary | ICD-10-CM | POA: Diagnosis present

## 2020-01-06 DIAGNOSIS — R7303 Prediabetes: Secondary | ICD-10-CM | POA: Insufficient documentation

## 2020-01-06 DIAGNOSIS — Z801 Family history of malignant neoplasm of trachea, bronchus and lung: Secondary | ICD-10-CM

## 2020-01-06 DIAGNOSIS — Z8249 Family history of ischemic heart disease and other diseases of the circulatory system: Secondary | ICD-10-CM

## 2020-01-06 DIAGNOSIS — Z720 Tobacco use: Secondary | ICD-10-CM | POA: Diagnosis present

## 2020-01-06 DIAGNOSIS — Z833 Family history of diabetes mellitus: Secondary | ICD-10-CM

## 2020-01-06 DIAGNOSIS — E876 Hypokalemia: Secondary | ICD-10-CM | POA: Diagnosis present

## 2020-01-06 DIAGNOSIS — K8050 Calculus of bile duct without cholangitis or cholecystitis without obstruction: Secondary | ICD-10-CM | POA: Insufficient documentation

## 2020-01-06 DIAGNOSIS — E785 Hyperlipidemia, unspecified: Secondary | ICD-10-CM | POA: Diagnosis present

## 2020-01-06 DIAGNOSIS — R109 Unspecified abdominal pain: Secondary | ICD-10-CM

## 2020-01-06 DIAGNOSIS — F32A Depression, unspecified: Secondary | ICD-10-CM | POA: Diagnosis present

## 2020-01-06 LAB — COMPREHENSIVE METABOLIC PANEL
ALT: 88 U/L — ABNORMAL HIGH (ref 0–44)
AST: 30 U/L (ref 15–41)
Albumin: 3.3 g/dL — ABNORMAL LOW (ref 3.5–5.0)
Alkaline Phosphatase: 224 U/L — ABNORMAL HIGH (ref 38–126)
Anion gap: 11 (ref 5–15)
BUN: 14 mg/dL (ref 6–20)
CO2: 21 mmol/L — ABNORMAL LOW (ref 22–32)
Calcium: 9.6 mg/dL (ref 8.9–10.3)
Chloride: 102 mmol/L (ref 98–111)
Creatinine, Ser: 0.82 mg/dL (ref 0.44–1.00)
GFR calc Af Amer: >60 mL/min (ref >60)
GFR calc non Af Amer: >60 mL/min (ref >60)
Glucose, Bld: 119 mg/dL — ABNORMAL HIGH (ref 70–99)
Potassium: 3.9 mmol/L (ref 3.5–5.1)
Sodium: 134 mmol/L — ABNORMAL LOW (ref 135–145)
Total Bilirubin: 1.5 mg/dL — ABNORMAL HIGH (ref 0.3–1.2)
Total Protein: 7 g/dL (ref 6.5–8.1)

## 2020-01-06 LAB — CBC
HCT: 41.2 % (ref 36.0–46.0)
Hemoglobin: 13.5 g/dL (ref 12.0–15.0)
MCH: 31.1 pg (ref 26.0–34.0)
MCHC: 32.8 g/dL (ref 30.0–36.0)
MCV: 94.9 fL (ref 80.0–100.0)
Platelets: 233 10*3/uL (ref 150–400)
RBC: 4.34 MIL/uL (ref 3.87–5.11)
RDW: 12.4 % (ref 11.5–15.5)
WBC: 17.8 10*3/uL — ABNORMAL HIGH (ref 4.0–10.5)
nRBC: 0 % (ref 0.0–0.2)

## 2020-01-06 LAB — URINALYSIS, ROUTINE W REFLEX MICROSCOPIC
Bilirubin Urine: NEGATIVE
Glucose, UA: NEGATIVE mg/dL
Ketones, ur: 20 mg/dL — AB
Nitrite: POSITIVE — AB
Protein, ur: 30 mg/dL — AB
Specific Gravity, Urine: 1.017 (ref 1.005–1.030)
pH: 5 (ref 5.0–8.0)

## 2020-01-06 LAB — I-STAT BETA HCG BLOOD, ED (MC, WL, AP ONLY): I-stat hCG, quantitative: <5 m[IU]/mL (ref <5)

## 2020-01-06 LAB — LACTIC ACID, PLASMA: Lactic Acid, Venous: 1 mmol/L (ref 0.5–1.9)

## 2020-01-06 LAB — LIPASE, BLOOD: Lipase: 32 U/L (ref 11–51)

## 2020-01-06 MED ORDER — ACETAMINOPHEN 500 MG PO TABS
1000.0000 mg | ORAL_TABLET | Freq: Once | ORAL | Status: AC
  Administered 2020-01-06: 1000 mg via ORAL
  Filled 2020-01-06: qty 2

## 2020-01-06 MED ORDER — SODIUM CHLORIDE 0.9 % IV SOLN
2.0000 g | Freq: Once | INTRAVENOUS | Status: AC
  Administered 2020-01-07: 2 g via INTRAVENOUS
  Filled 2020-01-06: qty 20

## 2020-01-06 MED ORDER — SODIUM CHLORIDE 0.9 % IV BOLUS
1000.0000 mL | Freq: Once | INTRAVENOUS | Status: AC
  Administered 2020-01-06: 1000 mL via INTRAVENOUS

## 2020-01-06 MED ORDER — ONDANSETRON HCL 4 MG/2ML IJ SOLN
4.0000 mg | Freq: Once | INTRAMUSCULAR | Status: AC
  Administered 2020-01-06: 4 mg via INTRAVENOUS
  Filled 2020-01-06: qty 2

## 2020-01-06 MED ORDER — MORPHINE SULFATE (PF) 4 MG/ML IV SOLN
4.0000 mg | Freq: Once | INTRAVENOUS | Status: AC
  Administered 2020-01-06: 4 mg via INTRAVENOUS
  Filled 2020-01-06: qty 1

## 2020-01-06 NOTE — ED Notes (Signed)
Patient is being discharged from the Urgent Care Center and sent to the Emergency Department via personal vehicle by self. Per Provider Cam Hai, patient is stable but in need of higher level of care due to RUQ abdominal pain. Patient is aware and verbalizes understanding of plan of care.    Vitals:   01/06/20 1119  BP: 124/69  Pulse: 98  Resp: 19  Temp: 99.5 F (37.5 C)  SpO2: 99%

## 2020-01-06 NOTE — ED Provider Notes (Signed)
Patient signed out to me by L. Layden, PA-C.  Please see previous notes for further history.  In brief, patient presented for evaluation of fever.  This began last week with a T-max of 102.  Fever improved for several days, and then returned yesterday.  She has associated nausea and decreased appetite.  She noted that her right shoulder was hurting, and was found to have tenderness palpation of the right upper quadrant, concern for gallbladder etiology.  She does have a history of chronic pancreatitis, unknown cause.  In the ED, patient found to be febrile up to 103.7.  Labs show mild leukocytosis and mild elevation of LFTs.  Ultrasound pending.  RUQ US shows gallbladder sludge without obvious thickening.  Dilated duct, concerning for cholangitis.  Will consult with general surgery.   Discussed with Dr. Sheliah Hatch, who will evaluate the pt. Recommends medicine admit and GI consultation as well. (pt's GI is with [facility])  Discussed with Dr. Allena Katz from [facility] hospital[facility] service, pt to be admitted.     Alveria Apley, PA-C 01/07/20 0141    Palumbo, April, MD 01/07/20 3582

## 2020-01-06 NOTE — ED Triage Notes (Signed)
Pt arrives POV for eval of fever, RUQ abd pain referred to shoulder. Pt reports fever last fri/sat/sunday, and returned today.

## 2020-01-06 NOTE — Discharge Instructions (Signed)
Please report to the ED for further eval, I have concern for gallbladder infection

## 2020-01-06 NOTE — Progress Notes (Signed)
Work note written and sent to [employer], as instructed by Dr. Ermalene Searing.

## 2020-01-06 NOTE — ED Triage Notes (Signed)
Pt presents with complaints of fever last Thurs-Sat and again starting yesterday as high as 102.8 at home. Denies any other symptoms. She did a VV with her pcp who suggest she come get tested for covid.  Pt also woke up with right shoulder pain. Denies any injury. Reports the pain is worse with movement. Denies relief with otc treatment.

## 2020-01-06 NOTE — Progress Notes (Signed)
VIRTUAL VISIT Due to national recommendations of social distancing due to [place] 19, a virtual visit is felt to be most appropriate for this patient at this time.   I connected with the patient on 01/06/20 at  9:20 AM EDT by virtual telehealth platform and verified that I am speaking with the correct person using two identifiers.   I discussed the limitations, risks, security and privacy concerns of performing an evaluation and management service by  virtual telehealth platform and the availability of in person appointments. I also discussed with the patient that there may be a patient responsible charge related to this service. The patient expressed understanding and agreed to proceed.  Patient location: Home Provider Location: [facility] Participants: Eliezer Lofts and Tommy Rainwater   Chief Complaint  Patient presents with  . Fever    Started last Thursday    History of Present Illness: Fever  This is a new problem. The current episode started in the past 7 days (1 week ago had fever 102.7 F, resolved then returned yesterday 102.64F). The problem occurs intermittently. Pertinent negatives include no abdominal pain, chest pain, congestion, coughing, diarrhea, ear pain, headaches, muscle aches, nausea, rash, sore throat, urinary pain, vomiting or wheezing. Associated symptoms comments: No appetite,   right shoulder sore Typical nausea with her chroic pancreatitis  no SOB. She has tried acetaminophen for the symptoms. The treatment provided mild relief.  Risk factors: no contaminated food, no contaminated water, no hx of cancer, no immunosuppression, no recent sickness, no recent travel and no sick contacts    SMOKER  She has not had COVID vaccine  COVID 19 screen No recent travel or known exposure to [place]  The importance of social distancing was discussed today.   Review of Systems  Constitutional: Positive for fever.  HENT: Negative for congestion, ear pain and sore  throat.   Respiratory: Negative for cough and wheezing.   Cardiovascular: Negative for chest pain.  Gastrointestinal: Negative for abdominal pain, diarrhea, nausea and vomiting.  Genitourinary: Negative for dysuria.  Skin: Negative for rash.  Neurological: Negative for headaches.      Past Medical History:  Diagnosis Date  . Chronic pancreatitis ([place])   . Depression   . GERD (gastroesophageal reflux disease)   . Medical history non-contributory     reports that she has been smoking cigarettes. She has a 9.00 pack-year smoking history. She has never used smokeless tobacco. She reports that she does not drink alcohol and does not use drugs.   Current Outpatient Medications:  .  buPROPion (WELLBUTRIN SR) 150 MG 12 hr tablet, TAKE 1 TABLET (150 MG TOTAL) BY MOUTH 2 (TWO) TIMES DAILY. FOR DEPRESSION., Disp: 60 tablet, Rfl: 0 .  famotidine (PEPCID) 20 MG tablet, TAKE 1 TABLET BY MOUTH ONCE TO TWICE DAILY FOR HEARTBURN., Disp: 180 tablet, Rfl: 0 .  gabapentin (NEURONTIN) 300 MG capsule, Take 2 capsules (600 mg total) by mouth 2 (two) times daily. As needed for pain., Disp: 360 capsule, Rfl: 0 .  ondansetron (ZOFRAN) 4 MG tablet, TAKE 1 TABLET BY MOUTH EVERY 6 HOURS AS NEEDED FOR NAUSEA AND VOMITING. QTY/DAY SUPPLY PER INSURANCE, Disp: 18 tablet, Rfl: 0 .  Pancrelipase, Lip-Prot-Amyl, (CREON) 24000-76000 units CPEP, TAKE 2 CAPSULE WITH MEALS ( 1 AFTER BEGINNING,1 WHEN FINISHED EATING). TAKE 1 CAPSULE WITH SNACK (IMMEDIATELY AFTER BEGINNING) TWICE DAILY., Disp: 240 capsule, Rfl: 2   Observations/Objective: Temperature (!) 102.8 F (39.3 C), temperature source Oral, height 5\' 4"  (1.626 m).  Physical  Exam  Physical Exam Constitutional:      General: The patient is not in acute distress. Pulmonary:     Effort: Pulmonary effort is normal. No respiratory distress.  Neurological:     Mental Status: The patient is alert and oriented to person, place, and time.  Psychiatric:        Mood and  Affect: Mood normal.        Behavior: Behavior normal.   Assessment and Plan Fever No other symptoms.  Recommend either Urgent Care evaluation in person or COVID testing ASAP with further eval if negative and not improving with supportive care.  ER precautions given and pt to call if new symptoms.      I discussed the assessment and treatment plan with the patient. The patient was provided an opportunity to ask questions and all were answered. The patient agreed with the plan and demonstrated an understanding of the instructions.   The patient was advised to call back or seek an in-person evaluation if the symptoms worsen or if the condition fails to improve as anticipated.     Kerby Nora, MD

## 2020-01-06 NOTE — ED Provider Notes (Signed)
[facility]    CSN: [id number] Arrival date & time: 01/06/20  1049      History   Chief Complaint Chief Complaint  Patient presents with  . Fever    HPI Anita Poole is a 54 y.o. female.   Patient reports her evaluation of fever.  She reports fever started last week from Thursday to Saturday.  She reports fevers up to 102 during that timeframe.  She reports this did respond to Tylenol at times.  She reports she had no more fevers throughout the week until yesterday when the fever returned.  She reports she woke this morning with right shoulder pain.  This has been the only symptom that is been out of the ordinary for her since this all started.  She does have a history of chronic pancreatitis it was secondary to a stone.  She reports she takes medicines for this however her pain is not been any more than her normal.  She denies other belly pain.  Denies nausea, vomiting, diarrhea.  Denies upper respiratory symptoms of cough, headache, runny nose, sore throat or ear pain.  She has not had any rashes.  She has not had any travel.  No known sick contacts.  No injury to the shoulder.  She reports this was simply started hurting when she woke up this morning.  She points to the back of her shoulder and upper back.  Patient still has her gallbladder.  No other abdominal surgeries with exception of C-section.     Past Medical History:  Diagnosis Date  . Chronic pancreatitis ([facility])   . Depression   . GERD (gastroesophageal reflux disease)   . Medical history non-contributory     Patient Active Problem List   Diagnosis Date Noted  . Fever 01/06/2020  . Chronic constipation 07/29/2019  . Gastroesophageal reflux disease 06/22/2018  . Hyperlipidemia 10/24/2016  . Preventative health care 10/24/2016  . 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 Tobacco abuse 09/12/2016  . Chronic pancreatitis ([facility]) 01/26/2016  . Prediabetes 01/26/2016  .  Choledocholithiasis 01/26/2016    Past Surgical History:  Procedure Laterality Date  . BREAST REDUCTION SURGERY    . CESAREAN SECTION    . SHOULDER SURGERY Left     OB History   No obstetric history on file.      Home Medications    Prior to Admission medications   Medication Sig Start Date End Date Taking? Authorizing Provider  buPROPion (WELLBUTRIN SR) 150 MG 12 hr tablet TAKE 1 TABLET (150 MG TOTAL) BY MOUTH 2 (TWO) TIMES DAILY. FOR DEPRESSION. 07/07/19   Doreene Nest, NP  famotidine (PEPCID) 20 MG tablet TAKE 1 TABLET BY MOUTH ONCE TO TWICE DAILY FOR HEARTBURN. 10/21/19   Doreene Nest, NP  gabapentin (NEURONTIN) 300 MG capsule Take 2 capsules (600 mg total) by mouth 2 (two) times daily. As needed for pain. 12/09/19   Doreene Nest, NP  ondansetron (ZOFRAN) 4 MG tablet TAKE 1 TABLET BY MOUTH EVERY 6 HOURS AS NEEDED FOR NAUSEA AND VOMITING. QTY/DAY SUPPLY PER INSURANCE 07/29/19   Doreene Nest, NP  Pancrelipase, Lip-Prot-Amyl, (CREON) 24000-76000 units CPEP TAKE 2 CAPSULE WITH MEALS ( 1 AFTER BEGINNING,1 WHEN FINISHED EATING). TAKE 1 CAPSULE WITH SNACK (IMMEDIATELY AFTER BEGINNING) TWICE DAILY. 11/25/19   Doreene Nest, NP    Family History Family History  Problem Relation Age of Onset  . Diabetes Mother   . Kidney failure Mother   .  Hypertension Mother   . Coronary artery disease Mother   . Heart disease Mother   . Lung cancer Father     Social History Social History   Tobacco Use  . Smoking status: Current Some Day Smoker    Packs/day: 0.30    Years: 30.00    Pack years: 9.00    Types: Cigarettes  . Smokeless tobacco: Never Used  Substance Use Topics  . Alcohol use: No  . Drug use: No     Allergies   Patient has no known allergies.   Review of Systems Review of Systems   Physical Exam Triage Vital Signs ED Triage Vitals  Enc Vitals Group     BP 01/06/20 1119 124/69     Pulse Rate 01/06/20 1119 98     Resp 01/06/20 1119 19      Temp 01/06/20 1119 99.5 F (37.5 C)     Temp src --      SpO2 01/06/20 1119 99 %     Weight --      Height --      Head Circumference --      Peak Flow --      Pain Score 01/06/20 1118 6     Pain Loc --      Pain Edu? --      Excl. in GC? --    No data found.  Updated Vital Signs BP 124/69   Pulse 98   Temp 99.5 F (37.5 C)   Resp 19   SpO2 99%   Visual Acuity Right Eye Distance:   Left Eye Distance:   Bilateral Distance:    Right Eye Near:   Left Eye Near:    Bilateral Near:     Physical Exam Vitals and nursing note reviewed.  Constitutional:      General: She is not in acute distress.    Appearance: She is well-developed. She is not ill-appearing.  HENT:     Head: Normocephalic and atraumatic.  Eyes:     Conjunctiva/sclera: Conjunctivae normal.  Cardiovascular:     Rate and Rhythm: Normal rate and regular rhythm.     Heart sounds: No murmur heard.   Pulmonary:     Effort: Pulmonary effort is normal. No respiratory distress.     Breath sounds: Normal breath sounds.  Abdominal:     General: Bowel sounds are normal. There is no distension.     Palpations: Abdomen is soft.     Tenderness: There is abdominal tenderness (Right upper quadrant tenderness with shoulder pain elicited on palpation of the right upper quadrant.  Murphy sign is positive.  There is also mild epigastric tenderness.  No other significant tenderness on exam.). There is no rebound.  Musculoskeletal:     Cervical back: Neck supple.     Comments: There is some tenderness in the right shoulder and pain with elicited range of motion.  No redness or swelling.  No rash.  Tenderness is primarily in the musculature of the trapezius and scapular region.  Some tenderness in the anterior shoulder.  No specific range of motion elicits greater pain.  Skin:    General: Skin is warm and dry.  Neurological:     Mental Status: She is alert.      UC Treatments / Results  Labs (all labs ordered are  listed, but only abnormal results are displayed) Labs Reviewed  SARS CORONAVIRUS 2 (TAT 6-24 HRS)    EKG   Radiology No results found.  Procedures Procedures (including critical care time)  Medications Ordered in UC Medications - No data to display  Initial Impression / Assessment and Plan / UC Course  I have reviewed the triage vital signs and the nursing notes.  Pertinent labs & imaging results that were available during my care of the patient were reviewed by me and considered in my medical decision making (see chart for details).     #Pyrexia #Right upper quadrant pain #Chronic pancreatitis Patient reports with pyrexia and right shoulder pain, found to have right upper quadrant pain on exam.  Given these findings concerned that shoulder pain is referred that this is biliary infection differential of choledocholithiasis, cholangitis or cholecystitis.  Given fevers and these findings will refer her to the emergency department for further evaluation today.  I discussed this with the patient and she agrees to report to [facility] emergency department following discharge.  She has stable vital signs she is safe for self transport. Final Clinical Impressions(s) / UC Diagnoses   Final diagnoses:  Fever, unspecified fever cause  Right upper quadrant abdominal pain  Chronic pancreatitis, unspecified pancreatitis type [facility])     Discharge Instructions     Please report to the ED for further eval, I have concern for gallbladder infection     ED Prescriptions    None     PDMP not reviewed this encounter.   Purnell Shoemaker, PA-C 01/06/20 1240

## 2020-01-06 NOTE — ED Provider Notes (Signed)
[facility] DEPART[facility] Provider Note   CSN: [id number] Arrival date & time: 01/06/20  1246     History Chief Complaint  Patient presents with  . Abdominal Pain    Anita Poole is a 54 y.o. female with PMH/o pancreatitis, depression, GERD who presents for evaluation of fever.  She reports that last week about 6 days ago, she noted fever for about 3 days.  She states the max temperature was about 102.  She states that it subsided for 1 day and then over the last 2 days, she has noted fever again up to 102.8.  She states she had had some nausea and decreased appetite but otherwise had not had any abdominal pain or vomiting.  She reports that this morning when she woke up, she felt some irritation in her right shoulder.  She denies any preceding trauma, injury, fall.  She states she did not have any overlying warmth, erythema, edema noted to the shoulder.  She initially went to urgent care and when they pressed on her right upper quadrant, she had significant pain and it made her shoulder pain worse.  She was sent to the emergency department for further evaluation.  She states she has never had any abdominal surgeries.  She denies any chest pain, cough, difficulty breathing, vomiting, dysuria, hematuria.  She does report a history of chronic pancreatitis.  She does not know what is it is caused by.  She denies any alcohol use.  The history is provided by the patient.       Past Medical History:  Diagnosis Date  . Chronic pancreatitis ([place])   . Depression   . GERD (gastroesophageal reflux disease)   . Medical history non-contributory     Patient Active Problem List   Diagnosis Date Noted  . Fever 01/06/2020  . Chronic constipation 07/29/2019  . Gastroesophageal reflux disease 06/22/2018  . Hyperlipidemia 10/24/2016  . Preventative health care 10/24/2016  . Moderate episode of recurrent major depressive disorder ([place]) 09/12/2016  . Tobacco abuse 09/12/2016    . Chronic pancreatitis ([place]) 01/26/2016  . Prediabetes 01/26/2016  . Choledocholithiasis 01/26/2016    Past Surgical History:  Procedure Laterality Date  . BREAST REDUCTION SURGERY    . CESAREAN SECTION    . SHOULDER SURGERY Left      OB History   No obstetric history on file.     Family History  Problem Relation Age of Onset  . Diabetes Mother   . Kidney failure Mother   . Hypertension Mother   . Coronary artery disease Mother   . Heart disease Mother   . Lung cancer Father     Social History   Tobacco Use  . Smoking status: Current Some Day Smoker    Packs/day: 0.30    Years: 30.00    Pack years: 9.00    Types: Cigarettes  . Smokeless tobacco: Never Used  Substance Use Topics  . Alcohol use: No  . Drug use: No    Home Medications Prior to Admission medications   Medication Sig Start Date End Date Taking? Authorizing Provider  acetaminophen (TYLENOL) 500 MG tablet Take 1,000-1,500 mg by mouth daily as needed for mild pain.   Yes [provider]  buPROPion (WELLBUTRIN SR) 150 MG 12 hr tablet TAKE 1 TABLET (150 MG TOTAL) BY MOUTH 2 (TWO) TIMES DAILY. FOR DEPRESSION. 07/07/19  Yes [name], NP  famotidine (PEPCID) 20 MG tablet TAKE 1 TABLET BY MOUTH ONCE TO TWICE  DAILY FOR HEARTBURN. Patient taking differently: Take 20 mg by mouth 2 (two) times daily as needed for heartburn.  10/21/19  Yes [name], NP  gabapentin (NEURONTIN) 300 MG capsule Take 2 capsules (600 mg total) by mouth 2 (two) times daily. As needed for pain. Patient taking differently: Take 600 mg by mouth 2 (two) times daily as needed (Pain).  12/09/19  Yes [name], NP  Multiple Vitamins-Minerals (CENTRUM SILVER ADULT 50+) TABS Take 1 tablet by mouth daily.   Yes [provider]  ondansetron (ZOFRAN) 4 MG tablet TAKE 1 TABLET BY MOUTH EVERY 6 HOURS AS NEEDED FOR NAUSEA AND VOMITING. QTY/DAY SUPPLY PER INSURANCE Patient taking differently: Take 4 mg by mouth  every 6 (six) hours as needed for nausea or vomiting.  07/29/19  Yes [name], NP  Pancrelipase, Lip-Prot-Amyl, (CREON) 24000-76000 units CPEP TAKE 2 CAPSULE WITH MEALS ( 1 AFTER BEGINNING,1 WHEN FINISHED EATING). TAKE 1 CAPSULE WITH SNACK (IMMEDIATELY AFTER BEGINNING) TWICE DAILY. Patient taking differently: Take 1-2 capsules by mouth See admin instructions. Take 2 capsules with a meal and 1 capsule with a snack.. 11/25/19  Yes [name], NP    Allergies    Patient has no known allergies.  Review of Systems   Review of Systems  Constitutional: Positive for fever.  Respiratory: Negative for cough and shortness of breath.   Cardiovascular: Negative for chest pain.  Gastrointestinal: Positive for nausea. Negative for abdominal pain and vomiting.  Genitourinary: Negative for dysuria and hematuria.  Musculoskeletal:       Shoulder pain  Neurological: Negative for headaches.  All other systems reviewed and are negative.   Physical Exam Updated Vital Signs BP (!) 146/67   Pulse (!) 102   Temp (!) 103.7 F (39.8 C) (Rectal)   Resp (!) 39   Ht '5\' 4"'$  (1.626 m)   Wt 71.2 kg   SpO2 96%   BMI 26.95 kg/m   Physical Exam Vitals and nursing note reviewed.  Constitutional:      Appearance: Normal appearance. She is well-developed.     Comments: Sitting comfortably on examination table  HENT:     Head: Normocephalic and atraumatic.  Eyes:     General: Lids are normal.     Conjunctiva/sclera: Conjunctivae normal.     Pupils: Pupils are equal, round, and reactive to light.  Cardiovascular:     Rate and Rhythm: Normal rate and regular rhythm.     Pulses: Normal pulses.     Heart sounds: Normal heart sounds. No murmur heard.  No friction rub. No gallop.   Pulmonary:     Effort: Pulmonary effort is normal.     Breath sounds: Normal breath sounds.     Comments: Lungs clear to auscultation bilaterally.  Symmetric chest rise.  No wheezing, rales, rhonchi. Abdominal:      Palpations: Abdomen is soft. Abdomen is not rigid.     Tenderness: There is abdominal tenderness in the right upper quadrant. There is no right CVA tenderness, left CVA tenderness or guarding.     Comments: Abdomen soft, nondistended.  Tenderness palpation of the right upper quadrant.  Positive Murphy sign.  No rigidity, guarding.  No CVA tenderness.  No tenderness at McBurney's point.  Musculoskeletal:        General: Normal range of motion.     Cervical back: Full passive range of motion without pain.     Comments: Full range of right shoulder intact with any difficulty.  There is no overlying warmth, erythema.  No bony tenderness.  Skin:    General: Skin is warm and dry.     Capillary Refill: Capillary refill takes less than 2 seconds.  Neurological:     Mental Status: She is alert and oriented to person, place, and time.  Psychiatric:        Speech: Speech normal.     ED Results / Procedures / Treatments   Labs (all labs ordered are listed, but only abnormal results are displayed) Labs Reviewed  COMPREHENSIVE METABOLIC PANEL - Abnormal; Notable for the following components:      Result Value   Sodium 134 (*)    CO2 21 (*)    Glucose, Bld 119 (*)    Albumin 3.3 (*)    ALT 88 (*)    Alkaline Phosphatase 224 (*)    Total Bilirubin 1.5 (*)    All other components within normal limits  CBC - Abnormal; Notable for the following components:   WBC 17.8 (*)    All other components within normal limits  URINALYSIS, ROUTINE W REFLEX MICROSCOPIC - Abnormal; Notable for the following components:   Color, Urine AMBER (*)    APPearance CLOUDY (*)    Hgb urine dipstick LARGE (*)    Ketones, ur 20 (*)    Protein, ur 30 (*)    Nitrite POSITIVE (*)    Leukocytes,Ua TRACE (*)    Bacteria, UA MANY (*)    All other components within normal limits  SARS CORONAVIRUS 2 BY RT PCR (HOSPITAL ORDER, [facility])  CULTURE, BLOOD (ROUTINE X 2)  CULTURE, BLOOD (ROUTINE X  2)  LIPASE, BLOOD  LACTIC ACID, PLASMA  LACTIC ACID, PLASMA  I-STAT BETA HCG BLOOD, ED ([facility], [facility], [facility] ONLY)    EKG None  Radiology US Abdomen Limited RUQ  Result Date: 01/06/2020 CLINICAL DATA:  Fever and right shoulder pain. EXAM: ULTRASOUND ABDOMEN LIMITED RIGHT UPPER QUADRANT COMPARISON:  None. FINDINGS: Gallbladder: Echogenic sludge is seen within the gallbladder lumen. No gallstones or wall thickening visualized (2.6 mm). No sonographic Murphy sign noted by sonographer. Common bile duct: Diameter: 13.8 mm Liver: 1.2 cm x 1.0 cm x 1.2 cm and 1.2 cm x 0.9 cm x 1.4 cm hypoechoic structures are seen within the right lobe. Intrahepatic biliary dilatation is seen. Diffusely increased echogenicity of the liver parenchyma is noted. Portal vein is patent on color Doppler imaging with normal direction of blood flow towards the liver. Other: None. IMPRESSION: 1. Gallbladder sludge without evidence of gallstones. 2. Dilated common bile duct with intrahepatic biliary dilatation. MRI correlation is recommended, as no obstructing stones or obstructing masses are identified on the current study. 3. Findings likely consistent with hepatic cysts. MRI correlation is again recommended. Electronically Signed   By: Virgina Norfolk M.D.   On: 01/06/2020 23:23    Procedures Procedures (including critical care time)  Medications Ordered in ED Medications  cefTRIAXone (ROCEPHIN) 2 g in sodium chloride 0.9 % 100 mL IVPB (has no administration in time range)  ondansetron (ZOFRAN) injection 4 mg (4 mg Intravenous Given 01/06/20 2212)  morphine 4 MG/ML injection 4 mg (4 mg Intravenous Given 01/06/20 2212)  sodium chloride 0.9 % bolus 1,000 mL (1,000 mLs Intravenous New Bag/Given 01/06/20 2212)  acetaminophen (TYLENOL) tablet 1,000 mg (1,000 mg Oral Given 01/06/20 2205)    ED Course  I have reviewed the triage vital signs and the nursing notes.  Pertinent labs & imaging results  that were available during my care of  the patient were reviewed by me and considered in my medical decision making (see chart for details).    MDM Rules/Calculators/A&P                          54 year old female who presents for evaluation of fever for the last few days.  She went to urgent care and was noted to have some right upper quadrant abdominal tenderness that irritated her shoulder and she was sent to the emergency department for further evaluation.  She has had some nausea and decreased appetite but no vomiting.  Initially arrival, she is febrile at 103.7, tachycardic, tachypneic.  Vitals otherwise stable.  On exam, she does have tenderness noted to the right upper quadrant abdominal exam. This also irritates her shoulder. Her shoulder ittself is not warm, hot or erythematous. No concern for septic arthritis. Concern for hepatobiliary etiology.  Labs ordered at triage.  CBC shows leukocytosis of 17.8.  Lipase is normal.  CMP shows sodium 134, BUN of 14, creatinine 0.82.  ALT is 88, alk phos is 224, total bili is 1.5.  This is new elevations.  UA does show leukocytes, nitrates, bacteria, pyuria.  Lactic is normal.  We will plan for right upper quadrant ultrasound for evaluation of hepatobiliary etiology. Will plan to treat with rocephin to cover both GU and hepatobilliary etiology. She hasn't been symptomatic from UTI.   Patient signed out to [facility], PA-C pending U/S. Anticipate admission.   Portions of this note were generated with [employer]. Dictation errors may occur despite best attempts at proofreading.  Final Clinical Impression(s) / ED Diagnoses Final diagnoses:  Abdominal pain    Rx / DC Orders ED Discharge Orders    None       Volanda Napoleon, PA-C 01/07/20 0000    Veryl Speak, MD 01/08/20 1901

## 2020-01-06 NOTE — Assessment & Plan Note (Signed)
No other symptoms.  Recommend either Urgent Care evaluation in person or COVID testing ASAP with further eval if negative and not improving with supportive care.  ER precautions given and pt to call if new symptoms.

## 2020-01-07 ENCOUNTER — Encounter (HOSPITAL_COMMUNITY): Payer: Self-pay | Admitting: Emergency Medicine

## 2020-01-07 ENCOUNTER — Inpatient Hospital Stay (HOSPITAL_COMMUNITY): Payer: PRIVATE HEALTH INSURANCE

## 2020-01-07 DIAGNOSIS — Z8249 Family history of ischemic heart disease and other diseases of the circulatory system: Secondary | ICD-10-CM | POA: Diagnosis not present

## 2020-01-07 DIAGNOSIS — K8309 Other cholangitis: Secondary | ICD-10-CM

## 2020-01-07 DIAGNOSIS — E876 Hypokalemia: Secondary | ICD-10-CM | POA: Diagnosis present

## 2020-01-07 DIAGNOSIS — F32A Depression, unspecified: Secondary | ICD-10-CM | POA: Diagnosis present

## 2020-01-07 DIAGNOSIS — K861 Other chronic pancreatitis: Secondary | ICD-10-CM | POA: Diagnosis present

## 2020-01-07 DIAGNOSIS — Z801 Family history of malignant neoplasm of trachea, bronchus and lung: Secondary | ICD-10-CM | POA: Diagnosis not present

## 2020-01-07 DIAGNOSIS — F320 Major depressive disorder, single episode, mild: Secondary | ICD-10-CM | POA: Diagnosis not present

## 2020-01-07 DIAGNOSIS — K7689 Other specified diseases of liver: Secondary | ICD-10-CM | POA: Diagnosis present

## 2020-01-07 DIAGNOSIS — K828 Other specified diseases of gallbladder: Secondary | ICD-10-CM | POA: Diagnosis present

## 2020-01-07 DIAGNOSIS — K750 Abscess of liver: Secondary | ICD-10-CM | POA: Diagnosis present

## 2020-01-07 DIAGNOSIS — Z833 Family history of diabetes mellitus: Secondary | ICD-10-CM | POA: Diagnosis not present

## 2020-01-07 DIAGNOSIS — R1011 Right upper quadrant pain: Secondary | ICD-10-CM | POA: Diagnosis not present

## 2020-01-07 DIAGNOSIS — E7800 Pure hypercholesterolemia, unspecified: Secondary | ICD-10-CM | POA: Diagnosis not present

## 2020-01-07 DIAGNOSIS — F329 Major depressive disorder, single episode, unspecified: Secondary | ICD-10-CM | POA: Diagnosis present

## 2020-01-07 DIAGNOSIS — Z20822 Contact with and (suspected) exposure to covid-19: Secondary | ICD-10-CM | POA: Diagnosis present

## 2020-01-07 DIAGNOSIS — K219 Gastro-esophageal reflux disease without esophagitis: Secondary | ICD-10-CM | POA: Diagnosis present

## 2020-01-07 DIAGNOSIS — Z841 Family history of disorders of kidney and ureter: Secondary | ICD-10-CM | POA: Diagnosis not present

## 2020-01-07 DIAGNOSIS — R1084 Generalized abdominal pain: Secondary | ICD-10-CM | POA: Diagnosis not present

## 2020-01-07 DIAGNOSIS — Z79899 Other long term (current) drug therapy: Secondary | ICD-10-CM | POA: Diagnosis not present

## 2020-01-07 DIAGNOSIS — F17210 Nicotine dependence, cigarettes, uncomplicated: Secondary | ICD-10-CM | POA: Diagnosis present

## 2020-01-07 DIAGNOSIS — E785 Hyperlipidemia, unspecified: Secondary | ICD-10-CM | POA: Diagnosis present

## 2020-01-07 DIAGNOSIS — R8271 Bacteriuria: Secondary | ICD-10-CM | POA: Diagnosis present

## 2020-01-07 HISTORY — DX: Abscess of liver: K75.0

## 2020-01-07 LAB — LACTIC ACID, PLASMA: Lactic Acid, Venous: 0.8 mmol/L (ref 0.5–1.9)

## 2020-01-07 LAB — COMPREHENSIVE METABOLIC PANEL
ALT: 77 U/L — ABNORMAL HIGH (ref 0–44)
AST: 32 U/L (ref 15–41)
Albumin: 2.7 g/dL — ABNORMAL LOW (ref 3.5–5.0)
Alkaline Phosphatase: 184 U/L — ABNORMAL HIGH (ref 38–126)
Anion gap: 8 (ref 5–15)
BUN: 14 mg/dL (ref 6–20)
CO2: 20 mmol/L — ABNORMAL LOW (ref 22–32)
Calcium: 9.1 mg/dL (ref 8.9–10.3)
Chloride: 107 mmol/L (ref 98–111)
Creatinine, Ser: 0.74 mg/dL (ref 0.44–1.00)
GFR calc Af Amer: >60 mL/min (ref >60)
GFR calc non Af Amer: >60 mL/min (ref >60)
Glucose, Bld: 121 mg/dL — ABNORMAL HIGH (ref 70–99)
Potassium: 3.6 mmol/L (ref 3.5–5.1)
Sodium: 135 mmol/L (ref 135–145)
Total Bilirubin: 1.5 mg/dL — ABNORMAL HIGH (ref 0.3–1.2)
Total Protein: 5.7 g/dL — ABNORMAL LOW (ref 6.5–8.1)

## 2020-01-07 LAB — CBC
HCT: 33.9 % — ABNORMAL LOW (ref 36.0–46.0)
Hemoglobin: 11.3 g/dL — ABNORMAL LOW (ref 12.0–15.0)
MCH: 31 pg (ref 26.0–34.0)
MCHC: 33.3 g/dL (ref 30.0–36.0)
MCV: 92.9 fL (ref 80.0–100.0)
Platelets: 178 10*3/uL (ref 150–400)
RBC: 3.65 MIL/uL — ABNORMAL LOW (ref 3.87–5.11)
RDW: 12.4 % (ref 11.5–15.5)
WBC: 14.5 10*3/uL — ABNORMAL HIGH (ref 4.0–10.5)
nRBC: 0 % (ref 0.0–0.2)

## 2020-01-07 LAB — SARS CORONAVIRUS 2 (TAT 6-24 HRS): SARS Coronavirus 2: NEGATIVE

## 2020-01-07 LAB — SARS CORONAVIRUS 2 BY RT PCR (HOSPITAL ORDER, PERFORMED IN ~~LOC~~ HOSPITAL LAB): SARS Coronavirus 2: NEGATIVE

## 2020-01-07 LAB — HIV ANTIBODY (ROUTINE TESTING W REFLEX): HIV Screen 4th Generation wRfx: NONREACTIVE

## 2020-01-07 MED ORDER — SODIUM CHLORIDE 0.9 % IV SOLN
2.0000 g | INTRAVENOUS | Status: DC
  Administered 2020-01-07 – 2020-01-12 (×6): 2 g via INTRAVENOUS
  Filled 2020-01-07 (×6): qty 20

## 2020-01-07 MED ORDER — LACTATED RINGERS IV SOLN: INTRAVENOUS | Status: DC

## 2020-01-07 MED ORDER — POLYETHYLENE GLYCOL 3350 17 G PO PACK
17.0000 g | PACK | Freq: Every day | ORAL | Status: DC
  Administered 2020-01-08 – 2020-01-13 (×5): 17 g via ORAL
  Filled 2020-01-07 (×6): qty 1

## 2020-01-07 MED ORDER — ONDANSETRON HCL 4 MG PO TABS: 4.0000 mg | ORAL_TABLET | Freq: Four times a day (QID) | ORAL | Status: DC | PRN

## 2020-01-07 MED ORDER — GADOBUTROL 1 MMOL/ML IV SOLN
7.5000 mL | Freq: Once | INTRAVENOUS | Status: AC | PRN
  Administered 2020-01-07: 7.5 mL via INTRAVENOUS

## 2020-01-07 MED ORDER — METRONIDAZOLE IN NACL 5-0.79 MG/ML-% IV SOLN
500.0000 mg | Freq: Once | INTRAVENOUS | Status: AC
  Administered 2020-01-07: 500 mg via INTRAVENOUS
  Filled 2020-01-07: qty 100

## 2020-01-07 MED ORDER — MORPHINE SULFATE (PF) 2 MG/ML IV SOLN
1.0000 mg | INTRAVENOUS | Status: DC | PRN
  Administered 2020-01-07 – 2020-01-10 (×7): 1 mg via INTRAVENOUS
  Filled 2020-01-07 (×7): qty 1

## 2020-01-07 MED ORDER — METRONIDAZOLE IN NACL 5-0.79 MG/ML-% IV SOLN
500.0000 mg | Freq: Three times a day (TID) | INTRAVENOUS | Status: DC
  Administered 2020-01-07 – 2020-01-09 (×7): 500 mg via INTRAVENOUS
  Filled 2020-01-07 (×7): qty 100

## 2020-01-07 MED ORDER — ONDANSETRON HCL 4 MG/2ML IJ SOLN
4.0000 mg | Freq: Four times a day (QID) | INTRAMUSCULAR | Status: DC | PRN
  Administered 2020-01-07 – 2020-01-13 (×4): 4 mg via INTRAVENOUS
  Filled 2020-01-07 (×5): qty 2

## 2020-01-07 MED ORDER — LACTATED RINGERS IV SOLN: INTRAVENOUS | Status: AC

## 2020-01-07 NOTE — Consult Note (Signed)
[facility] Consult  Referring Provider: [facility] Primary Care Physician:  [name], NP Primary Gastroenterologist: Chipper Herb  Reason for Consultation: Fever, right upper quadrant abdominal pain, history of pancreatitis  HPI: Anita Poole is a 54 y.o. female was admitted yesterday with complains of fever and right-sided abdominal pain. Patient states she was in her usual state of health until Thursday of last week when she had several episodes of fever which continued for 3 days but resolved on its own for 4 days before recurring 3 days ago. Patient complains of mild nausea but denies vomiting or episodes of diarrhea. She denies recent travel or sick contacts. She denies yellowish discoloration of skin/eyes or change in the color of stool or urine. She normally has a bowel movement about twice a week, denies noticing black stools or blood in stools. Her last colonoscopy was 2 years ago at [facility] and reported as normal per patient. She has history of chronic idiopathic pancreatitis, recalls having pancreatic stents placed in the past, and is on Creon, she takes 2 capsules with each meals and 1 capsule with each snack, usually 8 capsules a day. When she had fever last week she also had severe pain of the right shoulder and right upper quadrant. Patient has lost a few pounds in the last 1 week, but denies a significant unintentional weight loss.  She does not smoke. She does not drink alcohol.   Past Medical History:  Diagnosis Date  . Chronic pancreatitis ([place])   . Depression   . GERD (gastroesophageal reflux disease)   . Medical history non-contributory     Past Surgical History:  Procedure Laterality Date  . BREAST REDUCTION SURGERY    . CESAREAN SECTION    . SHOULDER SURGERY Left     Prior to Admission medications   Medication Sig Start Date End Date Taking? Authorizing Provider  acetaminophen (TYLENOL) 500 MG  tablet Take 1,000-1,500 mg by mouth daily as needed for mild pain.   Yes [provider]  buPROPion (WELLBUTRIN SR) 150 MG 12 hr tablet TAKE 1 TABLET (150 MG TOTAL) BY MOUTH 2 (TWO) TIMES DAILY. FOR DEPRESSION. 07/07/19  Yes [name], NP  famotidine (PEPCID) 20 MG tablet TAKE 1 TABLET BY MOUTH ONCE TO TWICE DAILY FOR HEARTBURN. Patient taking differently: Take 20 mg by mouth 2 (two) times daily as needed for heartburn.  10/21/19  Yes [name], NP  gabapentin (NEURONTIN) 300 MG capsule Take 2 capsules (600 mg total) by mouth 2 (two) times daily. As needed for pain. Patient taking differently: Take 600 mg by mouth 2 (two) times daily as needed (Pain).  12/09/19  Yes [name], NP  Multiple Vitamins-Minerals (CENTRUM SILVER ADULT 50+) TABS Take 1 tablet by mouth daily.   Yes [provider]  ondansetron (ZOFRAN) 4 MG tablet TAKE 1 TABLET BY MOUTH EVERY 6 HOURS AS NEEDED FOR NAUSEA AND VOMITING. QTY/DAY SUPPLY PER INSURANCE Patient taking differently: Take 4 mg by mouth every 6 (six) hours as needed for nausea or vomiting.  07/29/19  Yes [name], NP  Pancrelipase, Lip-Prot-Amyl, (CREON) 24000-76000 units CPEP TAKE 2 CAPSULE WITH MEALS ( 1 AFTER BEGINNING,1 WHEN FINISHED EATING). TAKE 1 CAPSULE WITH SNACK (IMMEDIATELY AFTER BEGINNING) TWICE DAILY. Patient taking differently: Take 1-2 capsules by mouth See admin instructions. Take 2 capsules with a meal and 1 capsule with a snack.. 11/25/19  Yes [name], NP    Current Facility-Administered Medications  Medication Dose Route Frequency Provider Last Rate Last Admin  . cefTRIAXone (ROCEPHIN) 2 g in sodium chloride 0.9 % 100 mL IVPB  2 g Intravenous Q24H Darreld Mclean R, MD      . lactated ringers infusion   Intravenous Continuous Marinda Elk, MD 100 mL/hr at 01/07/20 0820 New Bag at 01/07/20 0820  . metroNIDAZOLE (FLAGYL) IVPB 500 mg  500 mg Intravenous Q8H Darreld Mclean R, MD 100 mL/hr  at 01/07/20 0821 500 mg at 01/07/20 0821  . morphine 2 MG/ML injection 1 mg  1 mg Intravenous Q3H PRN Charlsie Quest, MD   1 mg at 01/07/20 1046  . ondansetron (ZOFRAN) tablet 4 mg  4 mg Oral Q6H PRN Charlsie Quest, MD       Or  . ondansetron (ZOFRAN) injection 4 mg  4 mg Intravenous Q6H PRN Darreld Mclean R, MD      . polyethylene glycol (MIRALAX / GLYCOLAX) packet 17 g  17 g Oral Daily Marinda Elk, MD        Allergies as of 01/06/2020  . (No Known Allergies)    Family History  Problem Relation Age of Onset  . Diabetes Mother   . Kidney failure Mother   . Hypertension Mother   . Coronary artery disease Mother   . Heart disease Mother   . Lung cancer Father     Social History   Socioeconomic History  . Marital status: Single    Spouse name: Not on file  . Number of children: Not on file  . Years of education: Not on file  . Highest education level: Not on file  Occupational History  . Not on file  Tobacco Use  . Smoking status: Current Some Day Smoker    Packs/day: 0.30    Years: 30.00    Pack years: 9.00    Types: Cigarettes  . Smokeless tobacco: Never Used  Substance and Sexual Activity  . Alcohol use: No  . Drug use: No  . Sexual activity: Never    Birth control/protection: None  Other Topics Concern  . Not on file  Social History Narrative   Single.   Moved from [place].   Works at [employer].   Enjoys spending time with family.   Social Determinants of Health   Financial Resource Strain:   . Difficulty of Paying Living Expenses:   Food Insecurity:   . Worried About Programme researcher, broadcasting/film/video in the Last Year:   . Barista in the Last Year:   Transportation Needs:   . Freight forwarder (Medical):   Marland Kitchen Lack of Transportation (Non-Medical):   Physical Activity:   . Days of Exercise per Week:   . Minutes of Exercise per Session:   Stress:   . Feeling of Stress :   Social Connections:   . Frequency of Communication with Friends and  Family:   . Frequency of Social Gatherings with Friends and Family:   . Attends Religious Services:   . Active Member of Clubs or Organizations:   . Attends Banker Meetings:   Marland Kitchen Marital Status:   Intimate Partner Violence:   . Fear of Current or Ex-Partner:   . Emotionally Abused:   Marland Kitchen Physically Abused:   . Sexually Abused:     Review of Systems: Positive for: GI: Described in detail in HPI.    Gen: fever, denies any  chills, rigors, night sweats, anorexia, fatigue, weakness, malaise, involuntary weight loss,  and sleep disorder CV: Denies chest pain, angina, palpitations, syncope, orthopnea, PND, peripheral edema, and claudication. Resp: Denies dyspnea, cough, sputum, wheezing, coughing up blood. GU : Denies urinary burning, blood in urine, urinary frequency, urinary hesitancy, nocturnal urination, and urinary incontinence. MS: Denies joint pain or swelling.  Denies muscle weakness, cramps, atrophy.  Derm: Denies rash, itching, oral ulcerations, hives, unhealing ulcers.  Psych: Denies depression, anxiety, memory loss, suicidal ideation, hallucinations,  and confusion. Heme: Denies bruising, bleeding, and enlarged lymph nodes. Neuro:  Denies any headaches, dizziness, paresthesias. Endo:  Denies any problems with DM, thyroid, adrenal function.  Physical Exam: Vital signs in last 24 hours: Temp:  [97.9 F (36.6 C)-103.7 F (39.8 C)] 99.8 F (37.7 C) (06/19 1014) Pulse Rate:  [77-107] 92 (06/19 1014) Resp:  [12-39] 18 (06/19 1014) BP: (100-152)/(56-75) 115/63 (06/19 1014) SpO2:  [93 %-99 %] 94 % (06/19 1014) Weight:  [71.2 kg] 71.2 kg (06/18 1328) Last BM Date: 01/03/20  General:   Alert,  Well-developed, well-nourished, pleasant and cooperative in NAD Head:  Normocephalic and atraumatic. Eyes:  Sclera clear, mild icterus.   Conjunctiva pink. Ears:  Normal auditory acuity. Nose:  No deformity, discharge,  or lesions, on oxygen via nasal cannula Mouth:  No  deformity or lesions.  Oropharynx pink & moist. Neck:  Supple; no masses or thyromegaly. Lungs:  Clear throughout to auscultation.   No wheezes, crackles, or rhonchi. No acute distress. Heart:  Regular rate and rhythm; no murmurs, clicks, rubs,  or gallops. Extremities:  Without clubbing or edema. Neurologic:  Alert and  oriented x4;  grossly normal neurologically. Skin:  Intact without significant lesions or rashes. Psych:  Alert and cooperative. Normal mood and affect. Abdomen:  Soft, nontender and nondistended. No masses, hepatosplenomegaly or hernias noted. Normal bowel sounds, without guarding, and without rebound.         Lab Results: Recent Labs    01/06/20 1332 01/07/20 0256  WBC 17.8* 14.5*  HGB 13.5 11.3*  HCT 41.2 33.9*  PLT 233 178   BMET Recent Labs    01/06/20 1332 01/07/20 0256  NA 134* 135  K 3.9 3.6  CL 102 107  CO2 21* 20*  GLUCOSE 119* 121*  BUN 14 14  CREATININE 0.82 0.74  CALCIUM 9.6 9.1   LFT Recent Labs    01/07/20 0256  PROT 5.7*  ALBUMIN 2.7*  AST 32  ALT 77*  ALKPHOS 184*  BILITOT 1.5*   PT/INR No results for input(s): LABPROT, INR in the last 72 hours.  Studies/Results: MR ABDOMEN MRCP W WO CONTAST  Result Date: 01/07/2020 CLINICAL DATA:  Fever, elevated white blood cell count. Mildly elevated bilirubin. EXAM: MRI ABDOMEN WITHOUT AND WITH CONTRAST (INCLUDING MRCP) TECHNIQUE: Multiplanar multisequence MR imaging of the abdomen was performed both before and after the administration of intravenous contrast. Heavily T2-weighted images of the biliary and pancreatic ducts were obtained, and three-dimensional MRCP images were rendered by post processing. CONTRAST:  7.26mL GADAVIST GADOBUTROL 1 MMOL/ML IV SOLN COMPARISON:  CT 01/27/2016, ultrasound 01/06/2020 FINDINGS: Lower chest:  Lung bases are clear. Hepatobiliary: Number multiple lesions within the liver which show peripheral enhancement on postcontrast T1 weighted imaging (series 21.  Approximately 20 lesions in the liver. Lesions range in size from 13 mm (image 16/21) up to 30 mm (image 13/21. Lesions are hyperintense on T2 weighted imaging and showed restricted diffusion (series 9 series 8. Lesions are hyperintense on T2 weighted imaging series 3 additionally. There is some sludge in the  gallbladder. Common bile duct is dilated up to 10 mm. Pancreas: There is chronic dilatation of the common pancreatic duct to 6 mm. There is a double duct sign (dilatation of the common bile duct and pancreatic duct however this is similar to CT 2017 or the pancreatic duct measured 10 mm. There is coarse calcification in the pancreatic head and within the duct. Calcification of the duct seen on image 21/3 there is atrophy of the tail. Spleen: Normal spleen. Adrenals/urinary tract: Adrenal glands normal. Nonenhancing cysts of the kidneys. Stomach/Bowel: Stomach and limited of the small bowel is unremarkable Vascular/Lymphatic: Abdominal aortic normal caliber. No retroperitoneal periportal lymphadenopathy. Musculoskeletal: No aggressive osseous lesion IMPRESSION: 1. Multiple round enhancing lesions within the liver which have restricted diffusion. Differential includes multifocal hepatic metastasis versus multifocal hepatic abscesses. Recommend clinical evaluation for sepsis versus metastatic disease including breast, colon cancer and pancreatic cancer. 2. Dilatation of the pancreatic duct and common bile duct is a common sign of pancreatic adenocarcinoma; however, the pancreatic ductal dilatation is similar to 2017. Patient has history of chronic pancreatitis with calcifications within the distal pancreatic duct. Favor findings acute chronic pancreatitis; however, recommend correlation with tumor markers (CA 19- 9). Electronically Signed   By: Genevive Bi M.D.   On: 01/07/2020 07:46   US Abdomen Limited RUQ  Result Date: 01/06/2020 CLINICAL DATA:  Fever and right shoulder pain. EXAM: ULTRASOUND ABDOMEN  LIMITED RIGHT UPPER QUADRANT COMPARISON:  None. FINDINGS: Gallbladder: Echogenic sludge is seen within the gallbladder lumen. No gallstones or wall thickening visualized (2.6 mm). No sonographic Murphy sign noted by sonographer. Common bile duct: Diameter: 13.8 mm Liver: 1.2 cm x 1.0 cm x 1.2 cm and 1.2 cm x 0.9 cm x 1.4 cm hypoechoic structures are seen within the right lobe. Intrahepatic biliary dilatation is seen. Diffusely increased echogenicity of the liver parenchyma is noted. Portal vein is patent on color Doppler imaging with normal direction of blood flow towards the liver. Other: None. IMPRESSION: 1. Gallbladder sludge without evidence of gallstones. 2. Dilated common bile duct with intrahepatic biliary dilatation. MRI correlation is recommended, as no obstructing stones or obstructing masses are identified on the current study. 3. Findings likely consistent with hepatic cysts. MRI correlation is again recommended. Electronically Signed   By: Aram Candela M.D.   On: 01/06/2020 23:23    Impression: Multiple round enhancing lesions within the liver suspicious for multifocal hepatic abscesses. Differential includes multifocal hepatic metastases, however patient has no history of breast/colon/pancreatic cancer Fever, elevated WBC of 14.5, minimally elevated LFTs T bili 1.5/AST 32/ALT 77/ALP 184 MRCP shows dilated CBD of 10 mm however no choledocholithiasis noted Patient noted to have dilation of pancreatic duct which is similar to studies from 2017, patient also has history of chronic pancreatitis with calcifications within the distal pancreatic duct, most likely has acute on chronic pancreatitis based on imaging Ultrasound showed gallbladder sludge without gallstones   Plan: Discussed findings of suspected liver abscesses with the patient and patient's hospitalist Dr. David Stall. Plan ultrasound-guided abscess drainage, hopefully can identify the organism, and curtail antibiotics  accordingly. Currently patient is on IV ceftriaxone and IV Flagyl Okay to start patient on regular diet post drainage of abscess. We will follow.     LOS: 0 days   Kerin Salen, MD  01/07/2020, 11:51 AM

## 2020-01-07 NOTE — H&P (Signed)
History and Physical    Anita Poole:[id number] DOB: 09/04/1965 DOA: 01/06/2020  PCP: [name], NP  Patient coming from: Home  I have personally briefly reviewed patient's old medical records in [place]  Chief Complaint: Fever, right upper quadrant pain  HPI: Anita Poole is a 54 y.o. female with medical history significant for calcific chronic pancreatitis of unknown etiology, GERD, depression who presents to the ED for evaluation of fevers and right upper quadrant pain.  Patient states she has been in her usual state of health until last week when she began to spike fevers for 3 consecutive days.  The following 3 days she had no fevers and then began to have fevers up to 102 Fahrenheit again on 01/06/2020.  She has had chills, diaphoresis, and some associated nausea without vomiting.  She was having right shoulder pain and went to urgent care for further evaluation.  On exam she had right upper quadrant pain and due to concern for gallbladder disease she was sent to the ED for further evaluation.  Patient reports a history of chronic pancreatitis which she feels has been well controlled and does not feel her current symptoms are similar to her prior pancreatitis symptoms.  She denies any chest pain, dyspnea, cough, dysuria, diarrhea.  She denies any recent medication changes.  She reports smoking about 2-3 cigarettes/day.  She denies any alcohol or illicit drug use.  ED Course:  Initial vitals show BP 152/75, pulse 98, RR 12, temp 101.3 Fahrenheit, SPO2 99% on room air. Rectal temperature was 103.7 Fahrenheit.  Labs are notable for AST 30, ALT 88, alk phos 224, total bilirubin 1.5, sodium 134, potassium 3.9, bicarb 21, BUN 14, creatinine 0.82, WBC 17.8, hemoglobin 13.5, platelets 233,000, lipase 32, lactic acid 1.0.  Urinalysis shows positive nitrites, trace leukocytes, 0-5 RBC/hpf, 6-10 WBC/hpf, many bacteria on microscopy. I-STAT beta-hCG <5.0. Blood cultures were  obtained and pending. SARS-CoV-2 PCR is negative.  RUQ ultrasound shows gallbladder sludge without evidence of gallstones. Dilated CBD measuring 13.8 mm is seen. No obstructing stones or masses are identified. Right hepatic lobe hypoechoic structures are seen suggestive of hepatic cysts.  Patient was given 1 L normal saline, Zofran, morphine. EDP discussed the case with on-call general surgery who will see in consultation. Patient was given IV ceftriaxone and Flagyl and the hospitalist service was consulted to admit for further evaluation and management.  Review of Systems: All systems reviewed and are negative except as documented in history of present illness above.   Past Medical History:  Diagnosis Date  . Chronic pancreatitis ([place])   . Depression   . GERD (gastroesophageal reflux disease)   . Medical history non-contributory     Past Surgical History:  Procedure Laterality Date  . BREAST REDUCTION SURGERY    . CESAREAN SECTION    . SHOULDER SURGERY Left     Social History:  reports that she has been smoking cigarettes. She has a 9.00 pack-year smoking history. She has never used smokeless tobacco. She reports that she does not drink alcohol and does not use drugs.  No Known Allergies  Family History  Problem Relation Age of Onset  . Diabetes Mother   . Kidney failure Mother   . Hypertension Mother   . Coronary artery disease Mother   . Heart disease Mother   . Lung cancer Father      Prior to Admission medications   Medication Sig Start Date End Date Taking? Authorizing Provider  acetaminophen (TYLENOL)  500 MG tablet Take 1,000-1,500 mg by mouth daily as needed for mild pain.   Yes [provider]  buPROPion (WELLBUTRIN SR) 150 MG 12 hr tablet TAKE 1 TABLET (150 MG TOTAL) BY MOUTH 2 (TWO) TIMES DAILY. FOR DEPRESSION. 07/07/19  Yes [name], NP  famotidine (PEPCID) 20 MG tablet TAKE 1 TABLET BY MOUTH ONCE TO TWICE DAILY FOR HEARTBURN. Patient taking  differently: Take 20 mg by mouth 2 (two) times daily as needed for heartburn.  10/21/19  Yes [name], NP  gabapentin (NEURONTIN) 300 MG capsule Take 2 capsules (600 mg total) by mouth 2 (two) times daily. As needed for pain. Patient taking differently: Take 600 mg by mouth 2 (two) times daily as needed (Pain).  12/09/19  Yes [name], NP  Multiple Vitamins-Minerals (CENTRUM SILVER ADULT 50+) TABS Take 1 tablet by mouth daily.   Yes [provider]  ondansetron (ZOFRAN) 4 MG tablet TAKE 1 TABLET BY MOUTH EVERY 6 HOURS AS NEEDED FOR NAUSEA AND VOMITING. QTY/DAY SUPPLY PER INSURANCE Patient taking differently: Take 4 mg by mouth every 6 (six) hours as needed for nausea or vomiting.  07/29/19  Yes [name], NP  Pancrelipase, Lip-Prot-Amyl, (CREON) 24000-76000 units CPEP TAKE 2 CAPSULE WITH MEALS ( 1 AFTER BEGINNING,1 WHEN FINISHED EATING). TAKE 1 CAPSULE WITH SNACK (IMMEDIATELY AFTER BEGINNING) TWICE DAILY. Patient taking differently: Take 1-2 capsules by mouth See admin instructions. Take 2 capsules with a meal and 1 capsule with a snack.. 11/25/19  Yes [name], NP    Physical Exam: Vitals:   01/06/20 2205 01/07/20 0020 01/07/20 0026 01/07/20 0029  BP:      Pulse:   79   Resp:   (!) 21   Temp: (!) 103.7 F (39.8 C) 99.3 F (37.4 C)    TempSrc: Rectal Oral    SpO2:   93% 96%  Weight:      Height:       Constitutional: Resting supine in bed, NAD, calm, comfortable Eyes: PERRL, slight scleral icterus ENMT: Mucous membranes are moist. Posterior pharynx clear of any exudate or lesions.Normal dentition.  Neck: normal, supple, no masses. Respiratory: clear to auscultation bilaterally, no wheezing, no crackles. Normal respiratory effort. No accessory muscle use.  Cardiovascular: Regular rate and rhythm, no murmurs / rubs / gallops. No extremity edema. 2+ pedal pulses. Abdomen: Mild RUQ tenderness after receiving pain medication, no masses palpated. No  hepatosplenomegaly. Bowel sounds positive.  Musculoskeletal: no clubbing / cyanosis. No joint deformity upper and lower extremities. Good ROM, no contractures. Normal muscle tone.  Skin: Diaphoretic, no rashes, lesions, ulcers. No induration Neurologic: CN 2-12 grossly intact. Sensation intact, Strength 5/5 in all 4.  Psychiatric: Normal judgment and insight. Alert and oriented x 3. Normal mood.   Labs on Admission: I have personally reviewed following labs and imaging studies  CBC: Recent Labs  Lab 01/06/20 1332  WBC 17.8*  HGB 13.5  HCT 41.2  MCV 94.9  PLT 423   Basic Metabolic Panel: Recent Labs  Lab 01/06/20 1332  NA 134*  K 3.9  CL 102  CO2 21*  GLUCOSE 119*  BUN 14  CREATININE 0.82  CALCIUM 9.6   GFR: Estimated Creatinine Clearance: 75 mL/min (by C-G formula based on SCr of 0.82 mg/dL). Liver Function Tests: Recent Labs  Lab 01/06/20 1332  AST 30  ALT 88*  ALKPHOS 224*  BILITOT 1.5*  PROT 7.0  ALBUMIN 3.3*   Recent Labs  Lab  01/06/20 1332  LIPASE 32   No results for input(s): AMMONIA in the last 168 hours. Coagulation Profile: No results for input(s): INR, PROTIME in the last 168 hours. Cardiac Enzymes: No results for input(s): CKTOTAL, CKMB, CKMBINDEX, TROPONINI in the last 168 hours. BNP (last 3 results) No results for input(s): PROBNP in the last 8760 hours. HbA1C: No results for input(s): HGBA1C in the last 72 hours. CBG: No results for input(s): GLUCAP in the last 168 hours. Lipid Profile: No results for input(s): CHOL, HDL, LDLCALC, TRIG, CHOLHDL, LDLDIRECT in the last 72 hours. Thyroid Function Tests: No results for input(s): TSH, T4TOTAL, FREET4, T3FREE, THYROIDAB in the last 72 hours. Anemia Panel: No results for input(s): VITAMINB12, FOLATE, FERRITIN, TIBC, IRON, RETICCTPCT in the last 72 hours. Urine analysis:    Component Value Date/Time   COLORURINE AMBER (A) 01/06/2020 2000   APPEARANCEUR CLOUDY (A) 01/06/2020 2000   LABSPEC  1.017 01/06/2020 2000   PHURINE 5.0 01/06/2020 2000   GLUCOSEU NEGATIVE 01/06/2020 2000   HGBUR LARGE (A) 01/06/2020 2000   BILIRUBINUR NEGATIVE 01/06/2020 2000   KETONESUR 20 (A) 01/06/2020 2000   PROTEINUR 30 (A) 01/06/2020 2000   NITRITE POSITIVE (A) 01/06/2020 2000   LEUKOCYTESUR TRACE (A) 01/06/2020 2000    Radiological Exams on Admission: US Abdomen Limited RUQ  Result Date: 01/06/2020 CLINICAL DATA:  Fever and right shoulder pain. EXAM: ULTRASOUND ABDOMEN LIMITED RIGHT UPPER QUADRANT COMPARISON:  None. FINDINGS: Gallbladder: Echogenic sludge is seen within the gallbladder lumen. No gallstones or wall thickening visualized (2.6 mm). No sonographic Murphy sign noted by sonographer. Common bile duct: Diameter: 13.8 mm Liver: 1.2 cm x 1.0 cm x 1.2 cm and 1.2 cm x 0.9 cm x 1.4 cm hypoechoic structures are seen within the right lobe. Intrahepatic biliary dilatation is seen. Diffusely increased echogenicity of the liver parenchyma is noted. Portal vein is patent on color Doppler imaging with normal direction of blood flow towards the liver. Other: None. IMPRESSION: 1. Gallbladder sludge without evidence of gallstones. 2. Dilated common bile duct with intrahepatic biliary dilatation. MRI correlation is recommended, as no obstructing stones or obstructing masses are identified on the current study. 3. Findings likely consistent with hepatic cysts. MRI correlation is again recommended. Electronically Signed   By: Aram Candela M.D.   On: 01/06/2020 23:23    EKG: Not performed.  Assessment/Plan Principal Problem:   Acute cholangitis Active Problems:   Chronic pancreatitis (HCC)   Depression  Tearsa Kowalewski is a 54 y.o. female with medical history significant for calcific chronic pancreatitis of unknown etiology, GERD, depression who is admitted with acute cholangitis.  Acute cholangitis: Patient with persistent fevers, leukocytosis, RUQ pain, and CBD dilatation on RUQ ultrasound  suggestive of acute cholangitis.  No obstructing stone or lesion seen.  Patient has been started on empiric antibiotics. -Continue IV ceftriaxone and Flagyl -Obtain MRI abdomen with MRCP -Continue IV fluid hydration overnight -Continue analgesics and antiemetics as needed -General surgery consulted and to see -Keep n.p.o. -Follow blood cultures  Asymptomatic bacteriuria: Denies any urinary symptoms.  Follow urine cultures, already on antibiotics as above.  Chronic pancreatitis: Chronic and appears stable.  Continue management as above.  Depression: Home bupropion hold while NPO.  DVT prophylaxis: SCDs Code Status: Full code, confirmed with patient Family Communication: Discussed with patient, she has discussed with family Disposition Plan: From home, likely discharge to home pending further evaluation and management of acute cholangitis Consults called: General surgery Admission status:  Status is: Inpatient  Remains  inpatient appropriate because:Ongoing diagnostic testing needed not appropriate for outpatient work up, IV treatments appropriate due to intensity of illness or inability to take PO and Inpatient level of care appropriate due to severity of illness  Dispo: The patient is from: Home              Anticipated d/c is to: Home              Anticipated d/c date is: 3 days pending further evaluation and management of acute cholangitis.              Patient currently is not medically stable to d/c.  Zada Finders MD [facility]  If 7PM-7AM, please contact night-coverage [email or web address]  01/07/2020, 1:14 AM

## 2020-01-07 NOTE — Consult Note (Signed)
Reason for Consult: abdominal pain Referring Physician: [name] is an 54 y.o. female.  HPI: 54 yo female with 1 week of intermittent fevers and epigastric abdominal pain. Pain has been intermittent. It radiates to the right shoulder. It is worse with movements. She has not had an appetite for 3 days. She denies nausea or vomiting. She has a history of pancreatitis of unknown origin.  Past Medical History:  Diagnosis Date  . Chronic pancreatitis ([place])   . Depression   . GERD (gastroesophageal reflux disease)   . Medical history non-contributory     Past Surgical History:  Procedure Laterality Date  . BREAST REDUCTION SURGERY    . CESAREAN SECTION    . SHOULDER SURGERY Left     Family History  Problem Relation Age of Onset  . Diabetes Mother   . Kidney failure Mother   . Hypertension Mother   . Coronary artery disease Mother   . Heart disease Mother   . Lung cancer Father     Social History:  reports that she has been smoking cigarettes. She has a 9.00 pack-year smoking history. She has never used smokeless tobacco. She reports that she does not drink alcohol and does not use drugs.  Allergies: No Known Allergies  Medications: I have reviewed the patient's current medications.  Results for orders placed or performed during the hospital encounter of 01/06/20 (from the past 48 hour(s))  Lipase, blood     Status: None   Collection Time: 01/06/20  1:32 PM  Result Value Ref Range   Lipase 32 11 - 51 U/L    Comment: Performed at [facility], [place] [address], [place], [place] [address]  Comprehensive metabolic panel     Status: Abnormal   Collection Time: 01/06/20  1:32 PM  Result Value Ref Range   Sodium 134 (L) 135 - 145 mmol/L   Potassium 3.9 3.5 - 5.1 mmol/L   Chloride 102 98 - 111 mmol/L   CO2 21 (L) 22 - 32 mmol/L   Glucose, Bld 119 (H) 70 - 99 mg/dL    Comment: Glucose reference range applies only to samples taken after fasting for at  least 8 hours.   BUN 14 6 - 20 mg/dL   Creatinine, Ser 0.82 0.44 - 1.00 mg/dL   Calcium 9.6 8.9 - 10.3 mg/dL   Total Protein 7.0 6.5 - 8.1 g/dL   Albumin 3.3 (L) 3.5 - 5.0 g/dL   AST 30 15 - 41 U/L   ALT 88 (H) 0 - 44 U/L   Alkaline Phosphatase 224 (H) 38 - 126 U/L   Total Bilirubin 1.5 (H) 0.3 - 1.2 mg/dL   GFR calc non Af Amer >60 >60 mL/min   GFR calc Af Amer >60 >60 mL/min   Anion gap 11 5 - 15    Comment: Performed at [facility] [address] [place], [place] [address]  CBC     Status: Abnormal   Collection Time: 01/06/20  1:32 PM  Result Value Ref Range   WBC 17.8 (H) 4.0 - 10.5 K/uL   RBC 4.34 3.87 - 5.11 MIL/uL   Hemoglobin 13.5 12.0 - 15.0 g/dL   HCT 41.2 36 - 46 %   MCV 94.9 80.0 - 100.0 fL   MCH 31.1 26.0 - 34.0 pg   MCHC 32.8 30.0 - 36.0 g/dL   RDW 12.4 11.5 - 15.5 %   Platelets 233 150 - 400 K/uL  nRBC 0.0 0.0 - 0.2 %    Comment: Performed at [facility], [address] [place], [place] [address]  I-Stat beta hCG blood, ED     Status: None   Collection Time: 01/06/20  2:02 PM  Result Value Ref Range   I-stat hCG, quantitative <5.0 <5 mIU/mL   Comment 3            Comment:   GEST. AGE      CONC.  (mIU/mL)   <=1 WEEK        5 - 50     2 WEEKS       50 - 500     3 WEEKS       100 - 10,000     4 WEEKS     1,000 - 30,000        FEMALE AND NON-PREGNANT FEMALE:     LESS THAN 5 mIU/mL   Urinalysis, Routine w reflex microscopic     Status: Abnormal   Collection Time: 01/06/20  8:00 PM  Result Value Ref Range   Color, Urine AMBER (A) YELLOW    Comment: BIOCHEMICALS MAY BE AFFECTED BY COLOR   APPearance CLOUDY (A) CLEAR   Specific Gravity, Urine 1.017 1.005 - 1.030   pH 5.0 5.0 - 8.0   Glucose, UA NEGATIVE NEGATIVE mg/dL   Hgb urine dipstick LARGE (A) NEGATIVE   Bilirubin Urine NEGATIVE NEGATIVE   Ketones, ur 20 (A) NEGATIVE mg/dL   Protein, ur 30 (A) NEGATIVE mg/dL   Nitrite POSITIVE (A) NEGATIVE   Leukocytes,Ua TRACE (A) NEGATIVE   RBC /  HPF 0-5 0 - 5 RBC/hpf   WBC, UA 6-10 0 - 5 WBC/hpf   Bacteria, UA MANY (A) NONE SEEN   Squamous Epithelial / LPF 0-5 0 - 5   Mucus PRESENT    Hyaline Casts, UA PRESENT     Comment: Performed at [facility], [address], [place], [place] [address]  Lactic acid, plasma     Status: None   Collection Time: 01/06/20  9:12 PM  Result Value Ref Range   Lactic Acid, Venous 1.0 0.5 - 1.9 mmol/L    Comment: Performed at [facility], [address], [place], [place] [address]  SARS Coronavirus 2 by RT PCR (hospital order, performed in [facility] hospital lab) Nasopharyngeal Nasopharyngeal Swab     Status: None   Collection Time: 01/06/20 10:14 PM   Specimen: Nasopharyngeal Swab  Result Value Ref Range   SARS Coronavirus 2 NEGATIVE NEGATIVE    Comment: (NOTE) SARS-CoV-2 target nucleic acids are NOT DETECTED.  The SARS-CoV-2 RNA is generally detectable in upper and lower respiratory specimens during the acute phase of infection. The lowest concentration of SARS-CoV-2 viral copies this assay can detect is 250 copies / mL. A negative result does not preclude SARS-CoV-2 infection and should not be used as the sole basis for treatment or other patient management decisions.  A negative result may occur with improper specimen collection / handling, submission of specimen other than nasopharyngeal swab, presence of viral mutation(s) within the areas targeted by this assay, and inadequate number of viral copies (<250 copies / mL). A negative result must be combined with clinical observations, patient history, and epidemiological information.  Fact Sheet for Patients:   BoilerBrush.com.cy  Fact Sheet for Healthcare Providers: https://pope.com/  This test is not yet approved or  cleared by the [place] FDA and has been authorized for detection  and/or diagnosis of SARS-CoV-2 by FDA under an Emergency Use Authorization (EUA).  This  EUA will remain in effect (meaning this test can be used) for the duration of the COVID-19 declaration under Section 564(b)(1) of the Act, 21 U.S.C. section 360bbb-3(b)(1), unless the authorization is terminated or revoked sooner.  Performed at [facility], [address], [place], [place] [address]     US Abdomen Limited RUQ  Result Date: 01/06/2020 CLINICAL DATA:  Fever and right shoulder pain. EXAM: ULTRASOUND ABDOMEN LIMITED RIGHT UPPER QUADRANT COMPARISON:  None. FINDINGS: Gallbladder: Echogenic sludge is seen within the gallbladder lumen. No gallstones or wall thickening visualized (2.6 mm). No sonographic Murphy sign noted by sonographer. Common bile duct: Diameter: 13.8 mm Liver: 1.2 cm x 1.0 cm x 1.2 cm and 1.2 cm x 0.9 cm x 1.4 cm hypoechoic structures are seen within the right lobe. Intrahepatic biliary dilatation is seen. Diffusely increased echogenicity of the liver parenchyma is noted. Portal vein is patent on color Doppler imaging with normal direction of blood flow towards the liver. Other: None. IMPRESSION: 1. Gallbladder sludge without evidence of gallstones. 2. Dilated common bile duct with intrahepatic biliary dilatation. MRI correlation is recommended, as no obstructing stones or obstructing masses are identified on the current study. 3. Findings likely consistent with hepatic cysts. MRI correlation is again recommended. Electronically Signed   By: Aram Candela M.D.   On: 01/06/2020 23:23    Review of Systems  Constitutional: Positive for chills, fever and malaise/fatigue.  HENT: Negative for hearing loss.   Eyes: Negative for blurred vision and double vision.  Respiratory: Negative for cough and hemoptysis.   Cardiovascular: Negative for chest pain and palpitations.  Gastrointestinal: Positive for abdominal pain. Negative for diarrhea, nausea and vomiting.  Genitourinary: Negative for dysuria and urgency.  Musculoskeletal: Negative for myalgias and neck pain.   Skin: Negative for itching and rash.  Neurological: Negative for dizziness, tingling and headaches.  Endo/Heme/Allergies: Does not bruise/bleed easily.  Psychiatric/Behavioral: Negative for depression and suicidal ideas.    PE Blood pressure (!) 146/67, pulse 79, temperature 99.3 F (37.4 C), temperature source Oral, resp. rate (!) 21, height 5\' 4"  (1.626 m), weight 71.2 kg, SpO2 96 %. Constitutional: NAD; conversant; no deformities Eyes: Moist conjunctiva; no lid lag; anicteric; PERRL Neck: Trachea midline; no thyromegaly Lungs: Normal respiratory effort; no tactile fremitus CV: RRR; no palpable thrills; no pitting edema GI: Abd tender to palpation in right subcostal area; no palpable hepatosplenomegaly MSK: moves all extremities, Normal gait; no clubbing/cyanosis Psychiatric: Appropriate affect; alert and oriented x3 Lymphatic: No palpable cervical or axillary lymphadenopathy   Assessment/Plan: 54 yo female with concern for infection and choledocholithiasis with bile duct of 1.2 cm. -Recommend early GI involvement for possible bile duct obstruction -agree with antibiotics -will benefit from gallbladder removal during this hospitalization  53 Jamire Shabazz 01/07/2020, 12:54 AM

## 2020-01-07 NOTE — H&P (Addendum)
Chief Complaint: Liver lesions  Referring Physician(s):Feliz Marguarite Arbour, MD  Supervising Physician: Corrie Mckusick  Patient Status: [facility] - In-pt  History of Present Illness: Anita Poole is a 54 y.o. female with medical history significant for calcific chronic pancreatitis of unknown etiology, GERD who presented to the ED yesterday evening for evaluation of fevers and right upper quadrant pain and right shoulder pain.  Starting last week, she had fevers for 3 consecutive days up to 102.  She also reports chills, diaphoresis, and nausea.  She went to urgent care for further evaluation.    On exam she had right upper quadrant tenderness and due to concern for gallbladder disease, she was sent to the ED for further evaluation.  She does not feel her current symptoms are similar to her prior pancreatitis symptoms.    On arrival to the ED, BP 152/75, pulse 98, RR 12, temp 101.3, O2 sats 99% on room air.   Labs= AST 30, ALT 88, alk phos 224, total bilirubin 1.5, sodium 134, potassium 3.9, bicarb 21, BUN 14, creatinine 0.82, WBC 17.8, hemoglobin 13.5, platelets 233, lipase 32, lactic acid 1.0.  Covid test is negative.  RUQ ultrasound showed= Gallbladder sludge without evidence of gallstones. Dilated CBD measuring 13.8 mm. No obstructing stones or masses are identified. Right hepatic lobe hypoechoic structures are seen suggestive of hepatic cysts.  MRCP showed= 1. Multiple round enhancing lesions within the liver which have restricted diffusion. Differential includes multifocal hepatic metastasis versus multifocal hepatic abscesses. Recommend clinical evaluation for sepsis versus metastatic disease including breast, colon cancer and pancreatic cancer. 2. Dilatation of the pancreatic duct and common bile duct is a common sign of pancreatic adenocarcinoma; however, the pancreatic ductal dilatation is similar to 2017. Patient has history of chronic pancreatitis with  calcifications within the distal pancreatic duct. Favor findings acute chronic pancreatitis; however, recommend correlation with tumor markers (CA 19- 9).  We are asked to evaluate images and consider either aspiration if these are abscesses, or biopsy if they are lesions.        Past Medical History:  Diagnosis Date  . Chronic pancreatitis ([place])   . Depression   . GERD (gastroesophageal reflux disease)   . Medical history non-contributory     Past Surgical History:  Procedure Laterality Date  . BREAST REDUCTION SURGERY    . CESAREAN SECTION    . SHOULDER SURGERY Left     Allergies: Patient has no known allergies.  Medications: Prior to Admission medications   Medication Sig Start Date End Date Taking? Authorizing Provider  acetaminophen (TYLENOL) 500 MG tablet Take 1,000-1,500 mg by mouth daily as needed for mild pain.   Yes [provider]  buPROPion (WELLBUTRIN SR) 150 MG 12 hr tablet TAKE 1 TABLET (150 MG TOTAL) BY MOUTH 2 (TWO) TIMES DAILY. FOR DEPRESSION. 07/07/19  Yes [name], NP  famotidine (PEPCID) 20 MG tablet TAKE 1 TABLET BY MOUTH ONCE TO TWICE DAILY FOR HEARTBURN. Patient taking differently: Take 20 mg by mouth 2 (two) times daily as needed for heartburn.  10/21/19  Yes [name], NP  gabapentin (NEURONTIN) 300 MG capsule Take 2 capsules (600 mg total) by mouth 2 (two) times daily. As needed for pain. Patient taking differently: Take 600 mg by mouth 2 (two) times daily as needed (Pain).  12/09/19  Yes [name], NP  Multiple Vitamins-Minerals (CENTRUM SILVER ADULT 50+) TABS Take 1 tablet by mouth daily.   Yes [provider]  ondansetron (ZOFRAN) 4 MG tablet TAKE 1 TABLET BY MOUTH EVERY 6 HOURS AS NEEDED FOR NAUSEA AND VOMITING. QTY/DAY SUPPLY PER INSURANCE Patient taking differently: Take 4 mg by mouth every 6 (six) hours as needed for nausea or vomiting.  07/29/19  Yes [name], NP  Pancrelipase,  Lip-Prot-Amyl, (CREON) 24000-76000 units CPEP TAKE 2 CAPSULE WITH MEALS ( 1 AFTER BEGINNING,1 WHEN FINISHED EATING). TAKE 1 CAPSULE WITH SNACK (IMMEDIATELY AFTER BEGINNING) TWICE DAILY. Patient taking differently: Take 1-2 capsules by mouth See admin instructions. Take 2 capsules with a meal and 1 capsule with a snack.. 11/25/19  Yes [name], NP     Family History  Problem Relation Age of Onset  . Diabetes Mother   . Kidney failure Mother   . Hypertension Mother   . Coronary artery disease Mother   . Heart disease Mother   . Lung cancer Father     Social History   Socioeconomic History  . Marital status: Single    Spouse name: Not on file  . Number of children: Not on file  . Years of education: Not on file  . Highest education level: Not on file  Occupational History  . Not on file  Tobacco Use  . Smoking status: Current Some Day Smoker    Packs/day: 0.30    Years: 30.00    Pack years: 9.00    Types: Cigarettes  . Smokeless tobacco: Never Used  Substance and Sexual Activity  . Alcohol use: No  . Drug use: No  . Sexual activity: Never    Birth control/protection: None  Other Topics Concern  . Not on file  Social History Narrative   Single.   Moved from [place].   Works at [employer].   Enjoys spending time with family.   Social Determinants of Health   Financial Resource Strain:   . Difficulty of Paying Living Expenses:   Food Insecurity:   . Worried About Charity fundraiser in the Last Year:   . Arboriculturist in the Last Year:   Transportation Needs:   . Film/video editor (Medical):   Marland Kitchen Lack of Transportation (Non-Medical):   Physical Activity:   . Days of Exercise per Week:   . Minutes of Exercise per Session:   Stress:   . Feeling of Stress :   Social Connections:   . Frequency of Communication with Friends and Family:   . Frequency of Social Gatherings with Friends and Family:   . Attends Religious Services:   . Active Member of  Clubs or Organizations:   . Attends Archivist Meetings:   Marland Kitchen Marital Status:      Review of Systems: A 12 point ROS discussed and pertinent positives are indicated in the HPI above.  All other systems are negative.  Review of Systems  Vital Signs: BP 115/63 (BP Location: Left Arm)   Pulse 92   Temp 99.8 F (37.7 C) (Oral)   Resp 18   Ht _0  (1.626 m)   Wt 71.2 kg   SpO2 94%   BMI 26.95 kg/m   Physical Exam Vitals reviewed.  Constitutional:      Appearance: Normal appearance.  HENT:     Head: Normocephalic and atraumatic.  Eyes:     Extraocular Movements: Extraocular movements intact.  Cardiovascular:     Rate and Rhythm: Normal rate and regular rhythm.  Pulmonary:     Effort: Pulmonary effort is normal. No respiratory distress.  Breath sounds: Normal breath sounds.  Abdominal:     General: There is no distension.     Palpations: Abdomen is soft.     Tenderness: There is abdominal tenderness.  Musculoskeletal:        General: Normal range of motion.  Skin:    General: Skin is warm and dry.  Neurological:     General: No focal deficit present.     Mental Status: She is alert and oriented to person, place, and time.  Psychiatric:        Mood and Affect: Mood normal.        Behavior: Behavior normal.        Thought Content: Thought content normal.        Judgment: Judgment normal.     Imaging: MR ABDOMEN MRCP W WO CONTAST  Result Date: 01/07/2020 CLINICAL DATA:  Fever, elevated white blood cell count. Mildly elevated bilirubin. EXAM: MRI ABDOMEN WITHOUT AND WITH CONTRAST (INCLUDING MRCP) TECHNIQUE: Multiplanar multisequence MR imaging of the abdomen was performed both before and after the administration of intravenous contrast. Heavily T2-weighted images of the biliary and pancreatic ducts were obtained, and three-dimensional MRCP images were rendered by post processing. CONTRAST:  7.54mL GADAVIST GADOBUTROL 1 MMOL/ML IV SOLN COMPARISON:  CT  01/27/2016, ultrasound 01/06/2020 FINDINGS: Lower chest:  Lung bases are clear. Hepatobiliary: Number multiple lesions within the liver which show peripheral enhancement on postcontrast T1 weighted imaging (series 21. Approximately 20 lesions in the liver. Lesions range in size from 13 mm (image 16/21) up to 30 mm (image 13/21. Lesions are hyperintense on T2 weighted imaging and showed restricted diffusion (series 9 series 8. Lesions are hyperintense on T2 weighted imaging series 3 additionally. There is some sludge in the gallbladder. Common bile duct is dilated up to 10 mm. Pancreas: There is chronic dilatation of the common pancreatic duct to 6 mm. There is a double duct sign (dilatation of the common bile duct and pancreatic duct however this is similar to CT 2017 or the pancreatic duct measured 10 mm. There is coarse calcification in the pancreatic head and within the duct. Calcification of the duct seen on image 21/3 there is atrophy of the tail. Spleen: Normal spleen. Adrenals/urinary tract: Adrenal glands normal. Nonenhancing cysts of the kidneys. Stomach/Bowel: Stomach and limited of the small bowel is unremarkable Vascular/Lymphatic: Abdominal aortic normal caliber. No retroperitoneal periportal lymphadenopathy. Musculoskeletal: No aggressive osseous lesion IMPRESSION: 1. Multiple round enhancing lesions within the liver which have restricted diffusion. Differential includes multifocal hepatic metastasis versus multifocal hepatic abscesses. Recommend clinical evaluation for sepsis versus metastatic disease including breast, colon cancer and pancreatic cancer. 2. Dilatation of the pancreatic duct and common bile duct is a common sign of pancreatic adenocarcinoma; however, the pancreatic ductal dilatation is similar to 2017. Patient has history of chronic pancreatitis with calcifications within the distal pancreatic duct. Favor findings acute chronic pancreatitis; however, recommend correlation with tumor  markers (CA 19- 9). Electronically Signed   By: Genevive Bi M.D.   On: 01/07/2020 07:46   US Abdomen Limited RUQ  Result Date: 01/06/2020 CLINICAL DATA:  Fever and right shoulder pain. EXAM: ULTRASOUND ABDOMEN LIMITED RIGHT UPPER QUADRANT COMPARISON:  None. FINDINGS: Gallbladder: Echogenic sludge is seen within the gallbladder lumen. No gallstones or wall thickening visualized (2.6 mm). No sonographic Murphy sign noted by sonographer. Common bile duct: Diameter: 13.8 mm Liver: 1.2 cm x 1.0 cm x 1.2 cm and 1.2 cm x 0.9 cm x 1.4 cm hypoechoic structures are  seen within the right lobe. Intrahepatic biliary dilatation is seen. Diffusely increased echogenicity of the liver parenchyma is noted. Portal vein is patent on color Doppler imaging with normal direction of blood flow towards the liver. Other: None. IMPRESSION: 1. Gallbladder sludge without evidence of gallstones. 2. Dilated common bile duct with intrahepatic biliary dilatation. MRI correlation is recommended, as no obstructing stones or obstructing masses are identified on the current study. 3. Findings likely consistent with hepatic cysts. MRI correlation is again recommended. Electronically Signed   By: Virgina Norfolk M.D.   On: 01/06/2020 23:23    Labs:  CBC: Recent Labs    07/29/19 1526 01/06/20 1332 01/07/20 0256  WBC 7.7 17.8* 14.5*  HGB 14.0 13.5 11.3*  HCT 40.0 41.2 33.9*  PLT 216 233 178    COAGS: No results for input(s): INR, APTT in the last 8760 hours.  BMP: Recent Labs    07/29/19 1526 01/06/20 1332 01/07/20 0256  NA 140 134* 135  K 4.2 3.9 3.6  CL 108 102 107  CO2 24 21* 20*  GLUCOSE 145* 119* 121*  BUN _0 CALCIUM 9.9 9.6 9.1  CREATININE 0.71 0.82 0.74  GFRNONAA  --  >60 >60  GFRAA  --  >60 >60    LIVER FUNCTION TESTS: Recent Labs    07/29/19 1526 01/06/20 1332 01/07/20 0256  BILITOT 0.5 1.5* 1.5*  AST 14 30 32  ALT 16 88* 77*  ALKPHOS  --  224* 184*  PROT 6.3 7.0 5.7*  ALBUMIN  --   3.3* 2.7*    TUMOR MARKERS: No results for input(s): AFPTM, CEA, CA199, CHROMGRNA in the last 8760 hours.  Assessment and Plan: Multiple round enhancing lesions within the liver  = Differential includes multifocal hepatic metastasis versus multifocal hepatic abscesses.   Will plan for image guided aspiration versus biopsy on Monday by Dr. Kathlene Cote.  Risks and benefits of aspiration versus biopsy was discussed with the patient and/or patient's family including, but not limited to bleeding, infection, damage to adjacent structures or low yield requiring additional tests.  All of the questions were answered and there is agreement to proceed.  Consent signed and in chart.  *Recommend GI evaluation*  Thank you for this interesting consult.  I greatly enjoyed meeting Evalyne Cortopassi and look forward to participating in their care.  A copy of this report was sent to the requesting provider on this date.  Electronically Signed: Murrell Redden, PA-C   01/07/2020, 12:08 PM      I spent a total of 40 Minutes in face to face in clinical consultation, greater than 50% of which was counseling/coordinating care for sampling of liver lesion.

## 2020-01-07 NOTE — Progress Notes (Signed)
[facility] PROGRESS NOTE    Progress Note  Anita Poole  FWY:[id number] DOB: Jul 10, 1966 DOA: 01/06/2020 PCP: Doreene Nest, NP     Brief Narrative:   Anita Poole is an 54 y.o. female past medical history significant for chronic pancreatitis unknown etiology depression comes in for fever that started 3 days prior to admission and right shoulder pain, and ED she was found to be febrile with an AST of 30 ALT of 88 alkaline phosphatase 224 bilirubin of 1.4, white blood count 17.8 quadrant ultrasound shows sludge without stones but the common bile duct measures about 14 mm. Currently on Rocephin and Flagyl IV fluids surgery has been consulted.  Assessment/Plan:   Acute cholangitis Continue IV Rocephin and Flagyl, MRCP is pending. Continue IV narcotics and IV fluids.  Surgery was consulted who rec GI consult for possible ERCP. Continues to spike fevers, her CBC is pending this morning. Keep her n.p.o., MRCP is pending.  Chronic pancreatitis (HCC) Pain control continue IV fluids and narcotics.  Depression Cont home meds.  DVT prophylaxis: lovenox Family Communication:None Status is: Inpatient  Remains inpatient appropriate because:Hemodynamically unstable   Dispo: The patient is from: Home              Anticipated d/c is to: Home              Anticipated d/c date is: 3 days              Patient currently is not medically stable to d/c.        Code Status:     Code Status Orders  (From admission, onward)         Start     Ordered   01/07/20 0116  Full code  Continuous        01/07/20 0117        Code Status History    Date Active Date Inactive Code Status Order ID Comments User Context   01/26/2016 2106 01/30/2016 1749 Full Code [id number]  Michael Litter, MD Inpatient   01/26/2016 1755 01/26/2016 2106 Full Code [id number]  Katharina Caper, MD ED   Advance Care Planning Activity        IV Access:    Peripheral IV   Procedures and diagnostic  studies:   US Abdomen Limited RUQ  Result Date: 01/06/2020 CLINICAL DATA:  Fever and right shoulder pain. EXAM: ULTRASOUND ABDOMEN LIMITED RIGHT UPPER QUADRANT COMPARISON:  None. FINDINGS: Gallbladder: Echogenic sludge is seen within the gallbladder lumen. No gallstones or wall thickening visualized (2.6 mm). No sonographic Murphy sign noted by sonographer. Common bile duct: Diameter: 13.8 mm Liver: 1.2 cm x 1.0 cm x 1.2 cm and 1.2 cm x 0.9 cm x 1.4 cm hypoechoic structures are seen within the right lobe. Intrahepatic biliary dilatation is seen. Diffusely increased echogenicity of the liver parenchyma is noted. Portal vein is patent on color Doppler imaging with normal direction of blood flow towards the liver. Other: None. IMPRESSION: 1. Gallbladder sludge without evidence of gallstones. 2. Dilated common bile duct with intrahepatic biliary dilatation. MRI correlation is recommended, as no obstructing stones or obstructing masses are identified on the current study. 3. Findings likely consistent with hepatic cysts. MRI correlation is again recommended. Electronically Signed   By: Aram Candela M.D.   On: 01/06/2020 23:23     Medical Consultants:    None.  Anti-Infectives:   IV Rocephin and Flagyl  Subjective:    Anita Poole she relates her abdominal pain is controlled  continues to be nauseated she is also relates she is thirsty.  Objective:    Vitals:   01/07/20 0026 01/07/20 0029 01/07/20 0118 01/07/20 0141  BP:   (!) 100/56 107/61  Pulse: 79  79 77  Resp: (!) 21  19 16   Temp:   98.4 F (36.9 C) 97.9 F (36.6 C)  TempSrc:   Oral Oral  SpO2: 93% 96% 97% 96%  Weight:      Height:       SpO2: 96 % O2 Flow Rate (L/min): 2 L/min   Intake/Output Summary (Last 24 hours) at 01/07/2020 0655 Last data filed at 01/07/2020 0400 Gross per 24 hour  Intake 1100 ml  Output 0 ml  Net 1100 ml   Filed Weights   01/06/20 1328  Weight: 71.2 kg    Exam: General exam: In no  acute distress. Respiratory system: Good air movement and clear to auscultation. Cardiovascular system: S1 & S2 heard, RRR. No JVD. Gastrointestinal system: Positive bowel sounds with right upper quadrant tenderness no rebound or guarding. Central nervous system: Alert and oriented. No focal neurological deficits. Extremities: No pedal edema. Skin: No rashes, lesions or ulcers   Data Reviewed:    Labs: Basic Metabolic Panel: Recent Labs  Lab 01/06/20 1332 01/07/20 0256  NA 134* 135  K 3.9 3.6  CL 102 107  CO2 21* 20*  GLUCOSE 119* 121*  BUN 14 14  CREATININE 0.82 0.74  CALCIUM 9.6 9.1   GFR Estimated Creatinine Clearance: 76.9 mL/min (by C-G formula based on SCr of 0.74 mg/dL). Liver Function Tests: Recent Labs  Lab 01/06/20 1332 01/07/20 0256  AST 30 32  ALT 88* 77*  ALKPHOS 224* 184*  BILITOT 1.5* 1.5*  PROT 7.0 5.7*  ALBUMIN 3.3* 2.7*   Recent Labs  Lab 01/06/20 1332  LIPASE 32   No results for input(s): AMMONIA in the last 168 hours. Coagulation profile No results for input(s): INR, PROTIME in the last 168 hours. COVID-19 Labs  No results for input(s): DDIMER, FERRITIN, LDH, CRP in the last 72 hours.  Lab Results  Component Value Date   SARSCOV2NAA NEGATIVE 01/06/2020   SARSCOV2NAA NEGATIVE 01/06/2020   SARSCOV2NAA Not Detected 03/07/2019    CBC: Recent Labs  Lab 01/06/20 1332 01/07/20 0256  WBC 17.8* 14.5*  HGB 13.5 11.3*  HCT 41.2 33.9*  MCV 94.9 92.9  PLT 233 178   Cardiac Enzymes: No results for input(s): CKTOTAL, CKMB, CKMBINDEX, TROPONINI in the last 168 hours. BNP (last 3 results) No results for input(s): PROBNP in the last 8760 hours. CBG: No results for input(s): GLUCAP in the last 168 hours. D-Dimer: No results for input(s): DDIMER in the last 72 hours. Hgb A1c: No results for input(s): HGBA1C in the last 72 hours. Lipid Profile: No results for input(s): CHOL, HDL, LDLCALC, TRIG, CHOLHDL, LDLDIRECT in the last 72  hours. Thyroid function studies: No results for input(s): TSH, T4TOTAL, T3FREE, THYROIDAB in the last 72 hours.  Invalid input(s): FREET3 Anemia work up: No results for input(s): VITAMINB12, FOLATE, FERRITIN, TIBC, IRON, RETICCTPCT in the last 72 hours. Sepsis Labs: Recent Labs  Lab 01/06/20 1332 01/06/20 2112 01/07/20 0009 01/07/20 0256  WBC 17.8*  --   --  14.5*  LATICACIDVEN  --  1.0 0.8  --    Microbiology Recent Results (from the past 240 hour(s))  SARS CORONAVIRUS 2 (TAT 6-24 HRS) Nasopharyngeal Nasopharyngeal Swab     Status: None   Collection Time: 01/06/20 11:22 AM  Specimen: Nasopharyngeal Swab  Result Value Ref Range Status   SARS Coronavirus 2 NEGATIVE NEGATIVE Final    Comment: (NOTE) SARS-CoV-2 target nucleic acids are NOT DETECTED.  The SARS-CoV-2 RNA is generally detectable in upper and lower respiratory specimens during the acute phase of infection. Negative results do not preclude SARS-CoV-2 infection, do not rule out co-infections with other pathogens, and should not be used as the sole basis for treatment or other patient management decisions. Negative results must be combined with clinical observations, patient history, and epidemiological information. The expected result is Negative.  Fact Sheet for Patients: SugarRoll.be  Fact Sheet for Healthcare Providers: https://www.woods-mathews.com/  This test is not yet approved or cleared by the [place] FDA and  has been authorized for detection and/or diagnosis of SARS-CoV-2 by FDA under an Emergency Use Authorization (EUA). This EUA will remain  in effect (meaning this test can be used) for the duration of the COVID-19 declaration under Se ction 564(b)(1) of the Act, 21 U.S.C. section 360bbb-3(b)(1), unless the authorization is terminated or revoked sooner.  Performed at [facility], [place] [address] [place], [place] [address]   SARS Coronavirus  2 by RT PCR (hospital order, performed in [facility] hospital lab) Nasopharyngeal Nasopharyngeal Swab     Status: None   Collection Time: 01/06/20 10:14 PM   Specimen: Nasopharyngeal Swab  Result Value Ref Range Status   SARS Coronavirus 2 NEGATIVE NEGATIVE Final    Comment: (NOTE) SARS-CoV-2 target nucleic acids are NOT DETECTED.  The SARS-CoV-2 RNA is generally detectable in upper and lower respiratory specimens during the acute phase of infection. The lowest concentration of SARS-CoV-2 viral copies this assay can detect is 250 copies / mL. A negative result does not preclude SARS-CoV-2 infection and should not be used as the sole basis for treatment or other patient management decisions.  A negative result may occur with improper specimen collection / handling, submission of specimen other than nasopharyngeal swab, presence of viral mutation(s) within the areas targeted by this assay, and inadequate number of viral copies (<250 copies / mL). A negative result must be combined with clinical observations, patient history, and epidemiological information.  Fact Sheet for Patients:   StrictlyIdeas.no  Fact Sheet for Healthcare Providers: BankingDealers.co.za  This test is not yet approved or  cleared by the [place] FDA and has been authorized for detection and/or diagnosis of SARS-CoV-2 by FDA under an Emergency Use Authorization (EUA).  This EUA will remain in effect (meaning this test can be used) for the duration of the COVID-19 declaration under Section 564(b)(1) of the Act, 21 U.S.C. section 360bbb-3(b)(1), unless the authorization is terminated or revoked sooner.  Performed at [facility], [place] [address] [place], [place] [address]      Medications:    Continuous Infusions:  cefTRIAXone (ROCEPHIN)  IV     lactated ringers     metronidazole        LOS: 0 days   Charlynne Cousins  [facility]  01/07/2020, 6:55 AM

## 2020-01-08 LAB — PROTIME-INR
INR: 1.2 (ref 0.8–1.2)
Prothrombin Time: 15.1 seconds (ref 11.4–15.2)

## 2020-01-08 MED ORDER — ACETAMINOPHEN 325 MG PO TABS
650.0000 mg | ORAL_TABLET | Freq: Four times a day (QID) | ORAL | Status: DC | PRN
  Administered 2020-01-08: 650 mg via ORAL
  Filled 2020-01-08: qty 2

## 2020-01-08 NOTE — Progress Notes (Signed)
Subjective: No change in some abdominal discomfort.  Some nausea, but no vomiting.  ROS: See above, otherwise other systems negative  Objective: Vital signs in last 24 hours: Temp:  [99.3 F (37.4 C)-99.7 F (37.6 C)] 99.5 F (37.5 C) (06/20 1003) Pulse Rate:  [77-81] 77 (06/20 1003) Resp:  [18-20] 18 (06/20 1003) BP: (117-126)/(65-75) 126/65 (06/20 1003) SpO2:  [91 %-98 %] 92 % (06/20 1003) Last BM Date: 01/03/20  Intake/Output from previous day: 06/19 0701 - 06/20 0700 In: 2279.9 [P.O.:220; I.V.:1859.9; IV Piggyback:200] Out: -  Intake/Output this shift: Total I/O In: 120 [P.O.:120] Out: -   PE: Abd: soft, mildly tender in RUQ and epigastrium, +BS, ND  Lab Results:  Recent Labs    01/06/20 1332 01/07/20 0256  WBC 17.8* 14.5*  HGB 13.5 11.3*  HCT 41.2 33.9*  PLT 233 178   BMET Recent Labs    01/06/20 1332 01/07/20 0256  NA 134* 135  K 3.9 3.6  CL 102 107  CO2 21* 20*  GLUCOSE 119* 121*  BUN 14 14  CREATININE 0.82 0.74  CALCIUM 9.6 9.1   PT/INR Recent Labs    01/08/20 0457  LABPROT 15.1  INR 1.2   CMP     Component Value Date/Time   NA 135 01/07/2020 0256   K 3.6 01/07/2020 0256   CL 107 01/07/2020 0256   CO2 20 (L) 01/07/2020 0256   GLUCOSE 121 (H) 01/07/2020 0256   BUN 14 01/07/2020 0256   CREATININE 0.74 01/07/2020 0256   CREATININE 0.71 07/29/2019 1526   CALCIUM 9.1 01/07/2020 0256   PROT 5.7 (L) 01/07/2020 0256   ALBUMIN 2.7 (L) 01/07/2020 0256   AST 32 01/07/2020 0256   ALT 77 (H) 01/07/2020 0256   ALKPHOS 184 (H) 01/07/2020 0256   BILITOT 1.5 (H) 01/07/2020 0256   GFRNONAA >60 01/07/2020 0256   GFRAA >60 01/07/2020 0256   Lipase     Component Value Date/Time   LIPASE 32 01/06/2020 1332       Studies/Results: MR ABDOMEN MRCP W WO CONTAST  Result Date: 01/07/2020 CLINICAL DATA:  Fever, elevated white blood cell count. Mildly elevated bilirubin. EXAM: MRI ABDOMEN WITHOUT AND WITH CONTRAST (INCLUDING MRCP)  TECHNIQUE: Multiplanar multisequence MR imaging of the abdomen was performed both before and after the administration of intravenous contrast. Heavily T2-weighted images of the biliary and pancreatic ducts were obtained, and three-dimensional MRCP images were rendered by post processing. CONTRAST:  7.35mL GADAVIST GADOBUTROL 1 MMOL/ML IV SOLN COMPARISON:  CT 01/27/2016, ultrasound 01/06/2020 FINDINGS: Lower chest:  Lung bases are clear. Hepatobiliary: Number multiple lesions within the liver which show peripheral enhancement on postcontrast T1 weighted imaging (series 21. Approximately 20 lesions in the liver. Lesions range in size from 13 mm (image 16/21) up to 30 mm (image 13/21. Lesions are hyperintense on T2 weighted imaging and showed restricted diffusion (series 9 series 8. Lesions are hyperintense on T2 weighted imaging series 3 additionally. There is some sludge in the gallbladder. Common bile duct is dilated up to 10 mm. Pancreas: There is chronic dilatation of the common pancreatic duct to 6 mm. There is a double duct sign (dilatation of the common bile duct and pancreatic duct however this is similar to CT 2017 or the pancreatic duct measured 10 mm. There is coarse calcification in the pancreatic head and within the duct. Calcification of the duct seen on image 21/3 there is atrophy of the tail. Spleen: Normal spleen. Adrenals/urinary tract:  Adrenal glands normal. Nonenhancing cysts of the kidneys. Stomach/Bowel: Stomach and limited of the small bowel is unremarkable Vascular/Lymphatic: Abdominal aortic normal caliber. No retroperitoneal periportal lymphadenopathy. Musculoskeletal: No aggressive osseous lesion IMPRESSION: 1. Multiple round enhancing lesions within the liver which have restricted diffusion. Differential includes multifocal hepatic metastasis versus multifocal hepatic abscesses. Recommend clinical evaluation for sepsis versus metastatic disease including breast, colon cancer and pancreatic  cancer. 2. Dilatation of the pancreatic duct and common bile duct is a common sign of pancreatic adenocarcinoma; however, the pancreatic ductal dilatation is similar to 2017. Patient has history of chronic pancreatitis with calcifications within the distal pancreatic duct. Favor findings acute chronic pancreatitis; however, recommend correlation with tumor markers (CA 19- 9). Electronically Signed   By: Suzy Bouchard M.D.   On: 01/07/2020 07:46   US Abdomen Limited RUQ  Result Date: 01/06/2020 CLINICAL DATA:  Fever and right shoulder pain. EXAM: ULTRASOUND ABDOMEN LIMITED RIGHT UPPER QUADRANT COMPARISON:  None. FINDINGS: Gallbladder: Echogenic sludge is seen within the gallbladder lumen. No gallstones or wall thickening visualized (2.6 mm). No sonographic Murphy sign noted by sonographer. Common bile duct: Diameter: 13.8 mm Liver: 1.2 cm x 1.0 cm x 1.2 cm and 1.2 cm x 0.9 cm x 1.4 cm hypoechoic structures are seen within the right lobe. Intrahepatic biliary dilatation is seen. Diffusely increased echogenicity of the liver parenchyma is noted. Portal vein is patent on color Doppler imaging with normal direction of blood flow towards the liver. Other: None. IMPRESSION: 1. Gallbladder sludge without evidence of gallstones. 2. Dilated common bile duct with intrahepatic biliary dilatation. MRI correlation is recommended, as no obstructing stones or obstructing masses are identified on the current study. 3. Findings likely consistent with hepatic cysts. MRI correlation is again recommended. Electronically Signed   By: Virgina Norfolk M.D.   On: 01/06/2020 23:23    Anti-infectives: Anti-infectives (From admission, onward)   Start     Dose/Rate Route Frequency Ordered Stop   01/07/20 2200  cefTRIAXone (ROCEPHIN) 2 g in sodium chloride 0.9 % 100 mL IVPB     Discontinue     2 g 200 mL/hr over 30 Minutes Intravenous Every 24 hours 01/07/20 0119     01/07/20 0800  metroNIDAZOLE (FLAGYL) IVPB 500 mg      Discontinue     500 mg 100 mL/hr over 60 Minutes Intravenous Every 8 hours 01/07/20 0119     01/07/20 0030  metroNIDAZOLE (FLAGYL) IVPB 500 mg        500 mg 100 mL/hr over 60 Minutes Intravenous  Once 01/07/20 0017 01/07/20 0218   01/06/20 2230  cefTRIAXone (ROCEPHIN) 2 g in sodium chloride 0.9 % 100 mL IVPB        2 g 200 mL/hr over 30 Minutes Intravenous  Once 01/06/20 2216 01/07/20 0110       Assessment/Plan Chronic pancreatitis  Multiple liver abscesses vs masses -IR to aspirate or biopsy these areas tomorrow to determine etiology. -agree with abx therapy -no role for surgery at this time for her gallbladder  FEN - regular VTE - SCDs ID - rocephin/flagyl   LOS: 1 day    Henreitta Cea , [facility] 01/08/2020, 12:16 PM Please see Amion for pager number during day hours 7:00am-4:30pm or 7:00am -11:30am on weekends

## 2020-01-08 NOTE — Progress Notes (Signed)
Subjective: Patient has been running low-grade fever between 99 to 100 F, but denies chills or rigors. Has been able to tolerate a diet, had a bowel movement today, denies abdominal pain.  Objective: Vital signs in last 24 hours: Temp:  [99.3 F (37.4 C)-99.7 F (37.6 C)] 99.5 F (37.5 C) (06/20 1003) Pulse Rate:  [77-81] 77 (06/20 1003) Resp:  [18-20] 18 (06/20 1003) BP: (117-126)/(65-75) 126/65 (06/20 1003) SpO2:  [91 %-98 %] 92 % (06/20 1003) Weight change:  Last BM Date: 01/03/20  PE: Comfortable, mild pallor, no icterus GENERAL: Able to speak in full sentences, not in distress ABDOMEN: Soft, nondistended, nontender, normoactive bowel sounds EXTREMITIES: No deformity, no edema  Lab Results: Results for orders placed or performed during the hospital encounter of 01/06/20 (from the past 48 hour(s))  Lipase, blood     Status: None   Collection Time: 01/06/20  1:32 PM  Result Value Ref Range   Lipase 32 11 - 51 U/L    Comment: Performed at [facility], [address] [place], [place] [address]  Comprehensive metabolic panel     Status: Abnormal   Collection Time: 01/06/20  1:32 PM  Result Value Ref Range   Sodium 134 (L) 135 - 145 mmol/L   Potassium 3.9 3.5 - 5.1 mmol/L   Chloride 102 98 - 111 mmol/L   CO2 21 (L) 22 - 32 mmol/L   Glucose, Bld 119 (H) 70 - 99 mg/dL    Comment: Glucose reference range applies only to samples taken after fasting for at least 8 hours.   BUN 14 6 - 20 mg/dL   Creatinine, Ser 1.70 0.44 - 1.00 mg/dL   Calcium 9.6 8.9 - 01.7 mg/dL   Total Protein 7.0 6.5 - 8.1 g/dL   Albumin 3.3 (L) 3.5 - 5.0 g/dL   AST 30 15 - 41 U/L   ALT 88 (H) 0 - 44 U/L   Alkaline Phosphatase 224 (H) 38 - 126 U/L   Total Bilirubin 1.5 (H) 0.3 - 1.2 mg/dL   GFR calc non Af Amer >60 >60 mL/min   GFR calc Af Amer >60 >60 mL/min   Anion gap 11 5 - 15    Comment: Performed at [facility], [address], [place], [place] [address]  CBC     Status: Abnormal    Collection Time: 01/06/20  1:32 PM  Result Value Ref Range   WBC 17.8 (H) 4.0 - 10.5 K/uL   RBC 4.34 3.87 - 5.11 MIL/uL   Hemoglobin 13.5 12.0 - 15.0 g/dL   HCT 67.5 36 - 46 %   MCV 94.9 80.0 - 100.0 fL   MCH 31.1 26.0 - 34.0 pg   MCHC 32.8 30.0 - 36.0 g/dL   RDW 91.6 38.4 - 66.5 %   Platelets 233 150 - 400 K/uL   nRBC 0.0 0.0 - 0.2 %    Comment: Performed at [facility], [address], [place], [place] [address]  I-Stat beta hCG blood, ED     Status: None   Collection Time: 01/06/20  2:02 PM  Result Value Ref Range   I-stat hCG, quantitative <5.0 <5 mIU/mL   Comment 3            Comment:   GEST. AGE      CONC.  (mIU/mL)   <=1 WEEK        5 - 50     2 WEEKS  50 - 500     3 WEEKS       100 - 10,000     4 WEEKS     1,000 - 30,000        FEMALE AND NON-PREGNANT FEMALE:     LESS THAN 5 mIU/mL   Urinalysis, Routine w reflex microscopic     Status: Abnormal   Collection Time: 01/06/20  8:00 PM  Result Value Ref Range   Color, Urine AMBER (A) YELLOW    Comment: BIOCHEMICALS MAY BE AFFECTED BY COLOR   APPearance CLOUDY (A) CLEAR   Specific Gravity, Urine 1.017 1.005 - 1.030   pH 5.0 5.0 - 8.0   Glucose, UA NEGATIVE NEGATIVE mg/dL   Hgb urine dipstick LARGE (A) NEGATIVE   Bilirubin Urine NEGATIVE NEGATIVE   Ketones, ur 20 (A) NEGATIVE mg/dL   Protein, ur 30 (A) NEGATIVE mg/dL   Nitrite POSITIVE (A) NEGATIVE   Leukocytes,Ua TRACE (A) NEGATIVE   RBC / HPF 0-5 0 - 5 RBC/hpf   WBC, UA 6-10 0 - 5 WBC/hpf   Bacteria, UA MANY (A) NONE SEEN   Squamous Epithelial / LPF 0-5 0 - 5   Mucus PRESENT    Hyaline Casts, UA PRESENT     Comment: Performed at [facility], [address], [place], [place] [address]  Lactic acid, plasma     Status: None   Collection Time: 01/06/20  9:12 PM  Result Value Ref Range   Lactic Acid, Venous 1.0 0.5 - 1.9 mmol/L    Comment: Performed at [facility], [address], [place], [place] [address]  SARS Coronavirus 2 by RT PCR  (hospital order, performed in [facility] hospital lab) Nasopharyngeal Nasopharyngeal Swab     Status: None   Collection Time: 01/06/20 10:14 PM   Specimen: Nasopharyngeal Swab  Result Value Ref Range   SARS Coronavirus 2 NEGATIVE NEGATIVE    Comment: (NOTE) SARS-CoV-2 target nucleic acids are NOT DETECTED.  The SARS-CoV-2 RNA is generally detectable in upper and lower respiratory specimens during the acute phase of infection. The lowest concentration of SARS-CoV-2 viral copies this assay can detect is 250 copies / mL. A negative result does not preclude SARS-CoV-2 infection and should not be used as the sole basis for treatment or other patient management decisions.  A negative result may occur with improper specimen collection / handling, submission of specimen other than nasopharyngeal swab, presence of viral mutation(s) within the areas targeted by this assay, and inadequate number of viral copies (<250 copies / mL). A negative result must be combined with clinical observations, patient history, and epidemiological information.  Fact Sheet for Patients:   BoilerBrush.com.cy  Fact Sheet for Healthcare Providers: https://pope.com/  This test is not yet approved or  cleared by the [place] FDA and has been authorized for detection and/or diagnosis of SARS-CoV-2 by FDA under an Emergency Use Authorization (EUA).  This EUA will remain in effect (meaning this test can be used) for the duration of the COVID-19 declaration under Section 564(b)(1) of the Act, 21 U.S.C. section 360bbb-3(b)(1), unless the authorization is terminated or revoked sooner.  Performed at [facility], [address], [place], [place] [address]   Culture, blood (routine x 2)     Status: None (Preliminary result)   Collection Time: 01/07/20 12:08 AM   Specimen: BLOOD LEFT HAND  Result Value Ref Range   Specimen Description BLOOD LEFT HAND    Special  Requests  BOTTLES DRAWN AEROBIC AND ANAEROBIC Blood Culture results may not be optimal due to an excessive volume of blood received in culture bottles   Culture      NO GROWTH 1 DAY Performed at [facility], [address] [place], [place] [address]    Report Status PENDING   Lactic acid, plasma     Status: None   Collection Time: 01/07/20 12:09 AM  Result Value Ref Range   Lactic Acid, Venous 0.8 0.5 - 1.9 mmol/L    Comment: Performed at [facility], [address] [place], [place] [address]  Culture, blood (routine x 2)     Status: None (Preliminary result)   Collection Time: 01/07/20 12:10 AM   Specimen: BLOOD RIGHT HAND  Result Value Ref Range   Specimen Description BLOOD RIGHT HAND    Special Requests      BOTTLES DRAWN AEROBIC AND ANAEROBIC Blood Culture adequate volume   Culture      NO GROWTH 1 DAY Performed at [facility], [address] [place], [place] [address]    Report Status PENDING   HIV Antibody (routine testing w rflx)     Status: None   Collection Time: 01/07/20  2:50 AM  Result Value Ref Range   HIV Screen 4th Generation wRfx Non Reactive Non Reactive    Comment: Performed at [facility], [address] [place], [place] [address]  Comprehensive metabolic panel     Status: Abnormal   Collection Time: 01/07/20  2:56 AM  Result Value Ref Range   Sodium 135 135 - 145 mmol/L   Potassium 3.6 3.5 - 5.1 mmol/L   Chloride 107 98 - 111 mmol/L   CO2 20 (L) 22 - 32 mmol/L   Glucose, Bld 121 (H) 70 - 99 mg/dL    Comment: Glucose reference range applies only to samples taken after fasting for at least 8 hours.   BUN 14 6 - 20 mg/dL   Creatinine, Ser 2.950.74 0.44 - 1.00 mg/dL   Calcium 9.1 8.9 - 62.110.3 mg/dL   Total Protein 5.7 (L) 6.5 - 8.1 g/dL   Albumin 2.7 (L) 3.5 - 5.0 g/dL   AST 32 15 - 41 U/L   ALT 77 (H) 0 - 44 U/L   Alkaline Phosphatase 184 (H) 38 - 126 U/L   Total Bilirubin 1.5 (H) 0.3 - 1.2 mg/dL   GFR calc non Af Amer >60  >60 mL/min   GFR calc Af Amer >60 >60 mL/min   Anion gap 8 5 - 15    Comment: Performed at [facility], [address] [place], [place] [address]  CBC     Status: Abnormal   Collection Time: 01/07/20  2:56 AM  Result Value Ref Range   WBC 14.5 (H) 4.0 - 10.5 K/uL   RBC 3.65 (L) 3.87 - 5.11 MIL/uL   Hemoglobin 11.3 (L) 12.0 - 15.0 g/dL   HCT 78.433.9 (L) 36 - 46 %   MCV 92.9 80.0 - 100.0 fL   MCH 31.0 26.0 - 34.0 pg   MCHC 33.3 30.0 - 36.0 g/dL   RDW 69.612.4 29.511.5 - 28.415.5 %   Platelets 178 150 - 400 K/uL   nRBC 0.0 0.0 - 0.2 %    Comment: Performed at [facility], [address] [place], [place] [address]  Protime-INR     Status: None   Collection Time: 01/08/20  4:57 AM  Result Value  Ref Range   Prothrombin Time 15.1 11.4 - 15.2 seconds   INR 1.2 0.8 - 1.2    Comment: (NOTE) INR goal varies based on device and disease states. Performed at [facility], [address], [place], [place] [address]     Studies/Results: MR ABDOMEN MRCP W WO CONTAST  Result Date: 01/07/2020 CLINICAL DATA:  Fever, elevated white blood cell count. Mildly elevated bilirubin. EXAM: MRI ABDOMEN WITHOUT AND WITH CONTRAST (INCLUDING MRCP) TECHNIQUE: Multiplanar multisequence MR imaging of the abdomen was performed both before and after the administration of intravenous contrast. Heavily T2-weighted images of the biliary and pancreatic ducts were obtained, and three-dimensional MRCP images were rendered by post processing. CONTRAST:  7.17mL GADAVIST GADOBUTROL 1 MMOL/ML IV SOLN COMPARISON:  CT 01/27/2016, ultrasound 01/06/2020 FINDINGS: Lower chest:  Lung bases are clear. Hepatobiliary: Number multiple lesions within the liver which show peripheral enhancement on postcontrast T1 weighted imaging (series 21. Approximately 20 lesions in the liver. Lesions range in size from 13 mm (image 16/21) up to 30 mm (image 13/21. Lesions are hyperintense on T2 weighted imaging and showed restricted diffusion  (series 9 series 8. Lesions are hyperintense on T2 weighted imaging series 3 additionally. There is some sludge in the gallbladder. Common bile duct is dilated up to 10 mm. Pancreas: There is chronic dilatation of the common pancreatic duct to 6 mm. There is a double duct sign (dilatation of the common bile duct and pancreatic duct however this is similar to CT 2017 or the pancreatic duct measured 10 mm. There is coarse calcification in the pancreatic head and within the duct. Calcification of the duct seen on image 21/3 there is atrophy of the tail. Spleen: Normal spleen. Adrenals/urinary tract: Adrenal glands normal. Nonenhancing cysts of the kidneys. Stomach/Bowel: Stomach and limited of the small bowel is unremarkable Vascular/Lymphatic: Abdominal aortic normal caliber. No retroperitoneal periportal lymphadenopathy. Musculoskeletal: No aggressive osseous lesion IMPRESSION: 1. Multiple round enhancing lesions within the liver which have restricted diffusion. Differential includes multifocal hepatic metastasis versus multifocal hepatic abscesses. Recommend clinical evaluation for sepsis versus metastatic disease including breast, colon cancer and pancreatic cancer. 2. Dilatation of the pancreatic duct and common bile duct is a common sign of pancreatic adenocarcinoma; however, the pancreatic ductal dilatation is similar to 2017. Patient has history of chronic pancreatitis with calcifications within the distal pancreatic duct. Favor findings acute chronic pancreatitis; however, recommend correlation with tumor markers (CA 19- 9). Electronically Signed   By: Genevive Bi M.D.   On: 01/07/2020 07:46   US Abdomen Limited RUQ  Result Date: 01/06/2020 CLINICAL DATA:  Fever and right shoulder pain. EXAM: ULTRASOUND ABDOMEN LIMITED RIGHT UPPER QUADRANT COMPARISON:  None. FINDINGS: Gallbladder: Echogenic sludge is seen within the gallbladder lumen. No gallstones or wall thickening visualized (2.6 mm). No  sonographic Murphy sign noted by sonographer. Common bile duct: Diameter: 13.8 mm Liver: 1.2 cm x 1.0 cm x 1.2 cm and 1.2 cm x 0.9 cm x 1.4 cm hypoechoic structures are seen within the right lobe. Intrahepatic biliary dilatation is seen. Diffusely increased echogenicity of the liver parenchyma is noted. Portal vein is patent on color Doppler imaging with normal direction of blood flow towards the liver. Other: None. IMPRESSION: 1. Gallbladder sludge without evidence of gallstones. 2. Dilated common bile duct with intrahepatic biliary dilatation. MRI correlation is recommended, as no obstructing stones or obstructing masses are identified on the current study. 3. Findings likely consistent with hepatic cysts. MRI correlation is again recommended. Electronically Signed  By: Virgina Norfolk M.D.   On: 01/06/2020 23:23    Medications: I have reviewed the patient's current medications.  Assessment: Multiple round enhancing lesions in the liver most likely multifocal hepatic abscesses, other differential includes multifocal hepatic metastases Has been started on IV antibiotics  History of chronic idiopathic pancreatitis, dilation of pancreatic duct and CBD noted, calcifications of the distal pancreatic duct evident  Plan: Plan for ultrasound-guided aspiration/biopsy of liver mass on 01/09/2020 by IR Continue IV ceftriaxone 2 g every 24 hours and IV Flagyl 500 mg every 8 hours We will follow  Ronnette Juniper, MD 01/08/2020, 1:10 PM

## 2020-01-08 NOTE — Progress Notes (Signed)
[facility] PROGRESS NOTE    Progress Note  Anita Poole  KGU:[id number] DOB: 1965-10-07 DOA: 01/06/2020 PCP: Doreene Nest, NP     Brief Narrative:   Anita Poole is an 54 y.o. female past medical history significant for chronic pancreatitis unknown etiology depression comes in for fever that started 3 days prior to admission and right shoulder pain, and ED she was found to be febrile with an AST of 30 ALT of 88 alkaline phosphatase 224 bilirubin of 1.4, white blood count 17.8 quadrant ultrasound shows sludge without stones but the common bile duct measures about 14 mm. Currently on Rocephin and Flagyl IV fluids surgery has been consulted.  Assessment/Plan:   Multiple liver abscesses: Continue IV Rocephin and Flagyl MRCP showed multiple abscesses. Continue narcotics for pain control.  Appreciate GIs assistance, we have consulted interventional radiology to perform aspiration to see if we could identify the organism. IR will plan for image guided aspiration on 01/09/2020  Chronic pancreatitis (HCC) Pain control continue IV fluids and narcotics.  Depression Cont home meds.  DVT prophylaxis: lovenox Family Communication:None Status is: Inpatient  Remains inpatient appropriate because:Hemodynamically unstable   Dispo: The patient is from: Home              Anticipated d/c is to: Home              Anticipated d/c date is: 3 days              Patient currently is not medically stable to d/c.     Code Status:     Code Status Orders  (From admission, onward)         Start     Ordered   01/07/20 0116  Full code  Continuous        01/07/20 0117        Code Status History    Date Active Date Inactive Code Status Order ID Comments User Context   01/26/2016 2106 01/30/2016 1749 Full Code [id number]  Michael Litter, MD Inpatient   01/26/2016 1755 01/26/2016 2106 Full Code [id number]  Katharina Caper, MD ED   Advance Care Planning Activity        IV Access:     Peripheral IV   Procedures and diagnostic studies:   MR ABDOMEN MRCP W WO CONTAST  Result Date: 01/07/2020 CLINICAL DATA:  Fever, elevated white blood cell count. Mildly elevated bilirubin. EXAM: MRI ABDOMEN WITHOUT AND WITH CONTRAST (INCLUDING MRCP) TECHNIQUE: Multiplanar multisequence MR imaging of the abdomen was performed both before and after the administration of intravenous contrast. Heavily T2-weighted images of the biliary and pancreatic ducts were obtained, and three-dimensional MRCP images were rendered by post processing. CONTRAST:  7.50mL GADAVIST GADOBUTROL 1 MMOL/ML IV SOLN COMPARISON:  CT 01/27/2016, ultrasound 01/06/2020 FINDINGS: Lower chest:  Lung bases are clear. Hepatobiliary: Number multiple lesions within the liver which show peripheral enhancement on postcontrast T1 weighted imaging (series 21. Approximately 20 lesions in the liver. Lesions range in size from 13 mm (image 16/21) up to 30 mm (image 13/21. Lesions are hyperintense on T2 weighted imaging and showed restricted diffusion (series 9 series 8. Lesions are hyperintense on T2 weighted imaging series 3 additionally. There is some sludge in the gallbladder. Common bile duct is dilated up to 10 mm. Pancreas: There is chronic dilatation of the common pancreatic duct to 6 mm. There is a double duct sign (dilatation of the common bile duct and pancreatic duct however this is similar to CT 2017  or the pancreatic duct measured 10 mm. There is coarse calcification in the pancreatic head and within the duct. Calcification of the duct seen on image 21/3 there is atrophy of the tail. Spleen: Normal spleen. Adrenals/urinary tract: Adrenal glands normal. Nonenhancing cysts of the kidneys. Stomach/Bowel: Stomach and limited of the small bowel is unremarkable Vascular/Lymphatic: Abdominal aortic normal caliber. No retroperitoneal periportal lymphadenopathy. Musculoskeletal: No aggressive osseous lesion IMPRESSION: 1. Multiple round  enhancing lesions within the liver which have restricted diffusion. Differential includes multifocal hepatic metastasis versus multifocal hepatic abscesses. Recommend clinical evaluation for sepsis versus metastatic disease including breast, colon cancer and pancreatic cancer. 2. Dilatation of the pancreatic duct and common bile duct is a common sign of pancreatic adenocarcinoma; however, the pancreatic ductal dilatation is similar to 2017. Patient has history of chronic pancreatitis with calcifications within the distal pancreatic duct. Favor findings acute chronic pancreatitis; however, recommend correlation with tumor markers (CA 19- 9). Electronically Signed   By: Suzy Bouchard M.D.   On: 01/07/2020 07:46   US Abdomen Limited RUQ  Result Date: 01/06/2020 CLINICAL DATA:  Fever and right shoulder pain. EXAM: ULTRASOUND ABDOMEN LIMITED RIGHT UPPER QUADRANT COMPARISON:  None. FINDINGS: Gallbladder: Echogenic sludge is seen within the gallbladder lumen. No gallstones or wall thickening visualized (2.6 mm). No sonographic Murphy sign noted by sonographer. Common bile duct: Diameter: 13.8 mm Liver: 1.2 cm x 1.0 cm x 1.2 cm and 1.2 cm x 0.9 cm x 1.4 cm hypoechoic structures are seen within the right lobe. Intrahepatic biliary dilatation is seen. Diffusely increased echogenicity of the liver parenchyma is noted. Portal vein is patent on color Doppler imaging with normal direction of blood flow towards the liver. Other: None. IMPRESSION: 1. Gallbladder sludge without evidence of gallstones. 2. Dilated common bile duct with intrahepatic biliary dilatation. MRI correlation is recommended, as no obstructing stones or obstructing masses are identified on the current study. 3. Findings likely consistent with hepatic cysts. MRI correlation is again recommended. Electronically Signed   By: Virgina Norfolk M.D.   On: 01/06/2020 23:23     Medical Consultants:    None.  Anti-Infectives:   IV Rocephin and  Flagyl  Subjective:    Anita Poole relates she is having a headache this morning.  Objective:    Vitals:   01/07/20 0733 01/07/20 1014 01/07/20 1631 01/08/20 0511  BP: 133/71 115/63 117/66 123/75  Pulse: 83 92 81 80  Resp: 18 18 18 20   Temp: 100.3 F (37.9 C) 99.8 F (37.7 C) 99.7 F (37.6 C) 99.3 F (37.4 C)  TempSrc: Oral Oral Oral   SpO2: 96% 94% 98% 91%  Weight:      Height:       SpO2: 91 % O2 Flow Rate (L/min): 2 L/min   Intake/Output Summary (Last 24 hours) at 01/08/2020 5681 Last data filed at 01/08/2020 0600 Gross per 24 hour  Intake 2279.89 ml  Output --  Net 2279.89 ml   Filed Weights   01/06/20 1328  Weight: 71.2 kg    Exam: General exam: In no acute distress. Respiratory system: Good air movement and clear to auscultation. Cardiovascular system: S1 & S2 heard, RRR. No JVD. Gastrointestinal system: Abdomen is nondistended, soft and nontender.  Extremities: No pedal edema. Skin: No rashes, lesions or ulcers Psychiatry: Judgement and insight appear normal. Mood & affect appropriate.   Data Reviewed:    Labs: Basic Metabolic Panel: Recent Labs  Lab 01/06/20 1332 01/07/20 0256  NA 134* 135  K 3.9  3.6  CL 102 107  CO2 21* 20*  GLUCOSE 119* 121*  BUN 14 14  CREATININE 0.82 0.74  CALCIUM 9.6 9.1   GFR Estimated Creatinine Clearance: 76.9 mL/min (by C-G formula based on SCr of 0.74 mg/dL). Liver Function Tests: Recent Labs  Lab 01/06/20 1332 01/07/20 0256  AST 30 32  ALT 88* 77*  ALKPHOS 224* 184*  BILITOT 1.5* 1.5*  PROT 7.0 5.7*  ALBUMIN 3.3* 2.7*   Recent Labs  Lab 01/06/20 1332  LIPASE 32   No results for input(s): AMMONIA in the last 168 hours. Coagulation profile Recent Labs  Lab 01/08/20 0457  INR 1.2   COVID-19 Labs  No results for input(s): DDIMER, FERRITIN, LDH, CRP in the last 72 hours.  Lab Results  Component Value Date   SARSCOV2NAA NEGATIVE 01/06/2020   SARSCOV2NAA NEGATIVE 01/06/2020    SARSCOV2NAA Not Detected 03/07/2019    CBC: Recent Labs  Lab 01/06/20 1332 01/07/20 0256  WBC 17.8* 14.5*  HGB 13.5 11.3*  HCT 41.2 33.9*  MCV 94.9 92.9  PLT 233 178   Cardiac Enzymes: No results for input(s): CKTOTAL, CKMB, CKMBINDEX, TROPONINI in the last 168 hours. BNP (last 3 results) No results for input(s): PROBNP in the last 8760 hours. CBG: No results for input(s): GLUCAP in the last 168 hours. D-Dimer: No results for input(s): DDIMER in the last 72 hours. Hgb A1c: No results for input(s): HGBA1C in the last 72 hours. Lipid Profile: No results for input(s): CHOL, HDL, LDLCALC, TRIG, CHOLHDL, LDLDIRECT in the last 72 hours. Thyroid function studies: No results for input(s): TSH, T4TOTAL, T3FREE, THYROIDAB in the last 72 hours.  Invalid input(s): FREET3 Anemia work up: No results for input(s): VITAMINB12, FOLATE, FERRITIN, TIBC, IRON, RETICCTPCT in the last 72 hours. Sepsis Labs: Recent Labs  Lab 01/06/20 1332 01/06/20 2112 01/07/20 0009 01/07/20 0256  WBC 17.8*  --   --  14.5*  LATICACIDVEN  --  1.0 0.8  --    Microbiology Recent Results (from the past 240 hour(s))  SARS CORONAVIRUS 2 (TAT 6-24 HRS) Nasopharyngeal Nasopharyngeal Swab     Status: None   Collection Time: 01/06/20 11:22 AM   Specimen: Nasopharyngeal Swab  Result Value Ref Range Status   SARS Coronavirus 2 NEGATIVE NEGATIVE Final    Comment: (NOTE) SARS-CoV-2 target nucleic acids are NOT DETECTED.  The SARS-CoV-2 RNA is generally detectable in upper and lower respiratory specimens during the acute phase of infection. Negative results do not preclude SARS-CoV-2 infection, do not rule out co-infections with other pathogens, and should not be used as the sole basis for treatment or other patient management decisions. Negative results must be combined with clinical observations, patient history, and epidemiological information. The expected result is Negative.  Fact Sheet for  Patients: HairSlick.no  Fact Sheet for Healthcare Providers: quierodirigir.com  This test is not yet approved or cleared by the [place] FDA and  has been authorized for detection and/or diagnosis of SARS-CoV-2 by FDA under an Emergency Use Authorization (EUA). This EUA will remain  in effect (meaning this test can be used) for the duration of the COVID-19 declaration under Se ction 564(b)(1) of the Act, 21 U.S.C. section 360bbb-3(b)(1), unless the authorization is terminated or revoked sooner.  Performed at [facility], [address] [place], [place] [address]   SARS Coronavirus 2 by RT PCR (hospital order, performed in [facility] hospital lab) Nasopharyngeal Nasopharyngeal Swab     Status: None   Collection Time:  01/06/20 10:14 PM   Specimen: Nasopharyngeal Swab  Result Value Ref Range Status   SARS Coronavirus 2 NEGATIVE NEGATIVE Final    Comment: (NOTE) SARS-CoV-2 target nucleic acids are NOT DETECTED.  The SARS-CoV-2 RNA is generally detectable in upper and lower respiratory specimens during the acute phase of infection. The lowest concentration of SARS-CoV-2 viral copies this assay can detect is 250 copies / mL. A negative result does not preclude SARS-CoV-2 infection and should not be used as the sole basis for treatment or other patient management decisions.  A negative result may occur with improper specimen collection / handling, submission of specimen other than nasopharyngeal swab, presence of viral mutation(s) within the areas targeted by this assay, and inadequate number of viral copies (<250 copies / mL). A negative result must be combined with clinical observations, patient history, and epidemiological information.  Fact Sheet for Patients:   BoilerBrush.com.cy  Fact Sheet for Healthcare Providers: https://pope.com/  This test is not yet  approved or  cleared by the [place] FDA and has been authorized for detection and/or diagnosis of SARS-CoV-2 by FDA under an Emergency Use Authorization (EUA).  This EUA will remain in effect (meaning this test can be used) for the duration of the COVID-19 declaration under Section 564(b)(1) of the Act, 21 U.S.C. section 360bbb-3(b)(1), unless the authorization is terminated or revoked sooner.  Performed at [facility], [address], [place], [place] [address]   Culture, blood (routine x 2)     Status: None (Preliminary result)   Collection Time: 01/07/20 12:08 AM   Specimen: BLOOD LEFT HAND  Result Value Ref Range Status   Specimen Description BLOOD LEFT HAND  Final   Special Requests   Final    BOTTLES DRAWN AEROBIC AND ANAEROBIC Blood Culture results may not be optimal due to an excessive volume of blood received in culture bottles   Culture   Final    NO GROWTH 1 DAY Performed at [facility], [address] [place], [place] [address]    Report Status PENDING  Incomplete  Culture, blood (routine x 2)     Status: None (Preliminary result)   Collection Time: 01/07/20 12:10 AM   Specimen: BLOOD RIGHT HAND  Result Value Ref Range Status   Specimen Description BLOOD RIGHT HAND  Final   Special Requests   Final    BOTTLES DRAWN AEROBIC AND ANAEROBIC Blood Culture adequate volume   Culture   Final    NO GROWTH 1 DAY Performed at [facility], [address] [place], [place] [address]    Report Status PENDING  Incomplete     Medications:   . polyethylene glycol  17 g Oral Daily   Continuous Infusions: . cefTRIAXone (ROCEPHIN)  IV 2 g (01/07/20 2139)  . metronidazole 500 mg (01/08/20 0821)      LOS: 1 day   Marinda Elk  [facility]  01/08/2020, 8:22 AM

## 2020-01-09 ENCOUNTER — Inpatient Hospital Stay (HOSPITAL_COMMUNITY): Payer: PRIVATE HEALTH INSURANCE

## 2020-01-09 HISTORY — PX: IR US GUIDE BX ASP/DRAIN: IMG2392

## 2020-01-09 LAB — URINE CULTURE: Culture: NO GROWTH

## 2020-01-09 MED ORDER — FENTANYL CITRATE (PF) 100 MCG/2ML IJ SOLN
INTRAMUSCULAR | Status: AC | PRN
  Administered 2020-01-09: 50 ug via INTRAVENOUS
  Administered 2020-01-09: 25 ug via INTRAVENOUS

## 2020-01-09 MED ORDER — HYDROCODONE-ACETAMINOPHEN 5-325 MG PO TABS
1.0000 | ORAL_TABLET | ORAL | Status: DC | PRN
  Administered 2020-01-09 (×3): 1 via ORAL
  Filled 2020-01-09 (×3): qty 1

## 2020-01-09 MED ORDER — MIDAZOLAM HCL 2 MG/2ML IJ SOLN
INTRAMUSCULAR | Status: AC
  Filled 2020-01-09: qty 2

## 2020-01-09 MED ORDER — MIDAZOLAM HCL 2 MG/2ML IJ SOLN
INTRAMUSCULAR | Status: AC | PRN
  Administered 2020-01-09: 1 mg via INTRAVENOUS
  Administered 2020-01-09: 0.5 mg via INTRAVENOUS

## 2020-01-09 MED ORDER — GELATIN ABSORBABLE 12-7 MM EX MISC
CUTANEOUS | Status: AC
  Filled 2020-01-09: qty 1

## 2020-01-09 MED ORDER — LIDOCAINE HCL (PF) 1 % IJ SOLN
INTRAMUSCULAR | Status: AC | PRN
  Administered 2020-01-09: 10 mL

## 2020-01-09 MED ORDER — FENTANYL CITRATE (PF) 100 MCG/2ML IJ SOLN
INTRAMUSCULAR | Status: AC
  Filled 2020-01-09: qty 2

## 2020-01-09 MED ORDER — METRONIDAZOLE 500 MG PO TABS
500.0000 mg | ORAL_TABLET | Freq: Three times a day (TID) | ORAL | Status: DC
  Administered 2020-01-09 – 2020-01-13 (×12): 500 mg via ORAL
  Filled 2020-01-09 (×12): qty 1

## 2020-01-09 MED ORDER — LIDOCAINE HCL 1 % IJ SOLN
INTRAMUSCULAR | Status: AC
  Filled 2020-01-09: qty 20

## 2020-01-09 NOTE — Progress Notes (Signed)
   Subjective/Chief Complaint: Complains of abd pain   Objective: Vital signs in last 24 hours: Temp:  [99.5 F (37.5 C)] 99.5 F (37.5 C) (06/20 2105) Pulse Rate:  [43-87] 63 (06/21 0853) Resp:  [16-20] 18 (06/21 0853) BP: (108-130)/(62-74) 108/62 (06/21 0853) SpO2:  [92 %-95 %] 93 % (06/21 0853) Weight:  [74.1 kg] 74.1 kg (06/20 2105) Last BM Date: 01/08/20  Intake/Output from previous day: 06/20 0701 - 06/21 0700 In: 980 [P.O.:580; IV Piggyback:400] Out: 500 [Urine:500] Intake/Output this shift: No intake/output data recorded.  General appearance: alert and cooperative Resp: clear to auscultation bilaterally Cardio: regular rate and rhythm GI: soft, moderate focal RUQ pain  Lab Results:  Recent Labs    01/06/20 1332 01/07/20 0256  WBC 17.8* 14.5*  HGB 13.5 11.3*  HCT 41.2 33.9*  PLT 233 178   BMET Recent Labs    01/06/20 1332 01/07/20 0256  NA 134* 135  K 3.9 3.6  CL 102 107  CO2 21* 20*  GLUCOSE 119* 121*  BUN 14 14  CREATININE 0.82 0.74  CALCIUM 9.6 9.1   PT/INR Recent Labs    01/08/20 0457  LABPROT 15.1  INR 1.2   ABG No results for input(s): PHART, HCO3 in the last 72 hours.  Invalid input(s): PCO2, PO2  Studies/Results: No results found.  Anti-infectives: Anti-infectives (From admission, onward)   Start     Dose/Rate Route Frequency Ordered Stop   01/07/20 2200  cefTRIAXone (ROCEPHIN) 2 g in sodium chloride 0.9 % 100 mL IVPB     Discontinue     2 g 200 mL/hr over 30 Minutes Intravenous Every 24 hours 01/07/20 0119     01/07/20 0800  metroNIDAZOLE (FLAGYL) IVPB 500 mg     Discontinue     500 mg 100 mL/hr over 60 Minutes Intravenous Every 8 hours 01/07/20 0119     01/07/20 0030  metroNIDAZOLE (FLAGYL) IVPB 500 mg        500 mg 100 mL/hr over 60 Minutes Intravenous  Once 01/07/20 0017 01/07/20 0218   01/06/20 2230  cefTRIAXone (ROCEPHIN) 2 g in sodium chloride 0.9 % 100 mL IVPB        2 g 200 mL/hr over 30 Minutes Intravenous   Once 01/06/20 2216 01/07/20 0110      Assessment/Plan: s/p * No surgery found * Liver abscesses vs masses  For IR today for aspiration vs. Biopsy Continue workup No plan for surgery at this point. Call if we can be of help  LOS: 2 days    Chevis Pretty III 01/09/2020

## 2020-01-09 NOTE — Sedation Documentation (Signed)
Patient has remained tachypneic throughout pre and post procedure. Denies any pain nor shortness of breath. Directed patient to take slow deep breaths in through nose and out through mouth and respirations decreased back into 10-20 range. No increased work of breathing noted.

## 2020-01-09 NOTE — Plan of Care (Signed)
  Problem: Education: Goal: Knowledge of General Education information will improve Description: Including pain rating scale, medication(s)/side effects and non-pharmacologic comfort measures Outcome: Completed/Met   Problem: Clinical Measurements: Goal: Diagnostic test results will improve Outcome: Completed/Met Goal: Cardiovascular complication will be avoided Outcome: Completed/Met   Problem: Elimination: Goal: Will not experience complications related to bowel motility Outcome: Completed/Met Goal: Will not experience complications related to urinary retention Outcome: Completed/Met   Problem: Skin Integrity: Goal: Risk for impaired skin integrity will decrease Outcome: Completed/Met

## 2020-01-09 NOTE — Procedures (Signed)
Interventional Radiology Procedure Note  Procedure: US guided aspiration of liver lesions  Complications: None  Estimated Blood Loss: < 10 mL  Findings: 18 G trocar needle advanced into right lobe of liver and used to aspirate two adjacent complex cystic areas yielding about 3 mL of purulent appearing, thick fluid. Sample split for culture and cytologic analysis.  Jodi Marble. Fredia Sorrow, M.D Pager:  [phone]

## 2020-01-09 NOTE — Progress Notes (Signed)
[facility] PROGRESS NOTE    Progress Note  Anita Poole  KDT:[id number] DOB: 11-05-1965 DOA: 01/06/2020 PCP: [name], NP     Brief Narrative:   Anita Poole is an 54 y.o. female past medical history significant for chronic pancreatitis unknown etiology depression comes in for fever that started 3 days prior to admission and right shoulder pain, and ED she was found to be febrile with an AST of 30 ALT of 88 alkaline phosphatase 224 bilirubin of 1.4, white blood count 17.8 quadrant ultrasound shows sludge without stones but the common bile duct measures about 14 mm. Currently on Rocephin and Flagyl IV fluids surgery has been consulted.  Assessment/Plan:   Multiple liver abscesses: Continue IV Rocephin and Flagyl MRCP showed multiple abscesses. Continue narcotics for pain control.  Appreciate GIs assistance, we have consulted interventional radiology to perform aspiration to see if we could identify the organism. IR will plan for image guided aspiration/biopsy on 01/09/2020 Cont Narcotics for pain control.  Chronic pancreatitis (HCC) Pain control continue IV fluids and narcotics.  Depression Cont home meds.  DVT prophylaxis: lovenox Family Communication:None Status is: Inpatient  Remains inpatient appropriate because:Hemodynamically unstable   Dispo: The patient is from: Home              Anticipated d/c is to: Home              Anticipated d/c date is: 2 days              Patient currently is not medically stable to d/c.     Code Status:     Code Status Orders  (From admission, onward)         Start     Ordered   01/07/20 0116  Full code  Continuous        01/07/20 0117        Code Status History    Date Active Date Inactive Code Status Order ID Comments User Context   01/26/2016 2106 01/30/2016 1749 Full Code [id number]  Lily Kocher, MD Inpatient   01/26/2016 1755 01/26/2016 2106 Full Code [id number]  Theodoro Grist, MD ED   Advance Care Planning  Activity        IV Access:    Peripheral IV   Procedures and diagnostic studies:   No results found.   Medical Consultants:    None.  Anti-Infectives:   IV Rocephin and Flagyl  Subjective:    Anita Poole she relates her appetite has been poor last bowel movement was yesterday.  Objective:    Vitals:   01/09/20 1020 01/09/20 1050 01/09/20 1055 01/09/20 1100  BP: 121/72 (!) 119/54 (!) 119/57 116/60  Pulse: 65 74 72 69  Resp: 19 (!) 29 (!) 36 (!) 33  Temp:      TempSrc:      SpO2: 95% 99% 98% 98%  Weight:      Height:       SpO2: 98 % O2 Flow Rate (L/min): 2 L/min   Intake/Output Summary (Last 24 hours) at 01/09/2020 1101 Last data filed at 01/09/2020 0600 Gross per 24 hour  Intake 760 ml  Output 500 ml  Net 260 ml   Filed Weights   01/06/20 1328 01/08/20 2105  Weight: 71.2 kg 74.1 kg    Exam: General exam: In no acute distress. Respiratory system: Good air movement and clear to auscultation. Cardiovascular system: S1 & S2 heard, RRR. No JVD. Extremities: No pedal edema. Skin: No rashes, lesions or  ulcers Psychiatry: Judgement and insight appear normal. Mood & affect appropriate.  Data Reviewed:    Labs: Basic Metabolic Panel: Recent Labs  Lab 01/06/20 1332 01/07/20 0256  NA 134* 135  K 3.9 3.6  CL 102 107  CO2 21* 20*  GLUCOSE 119* 121*  BUN 14 14  CREATININE 0.82 0.74  CALCIUM 9.6 9.1   GFR Estimated Creatinine Clearance: 78.4 mL/min (by C-G formula based on SCr of 0.74 mg/dL). Liver Function Tests: Recent Labs  Lab 01/06/20 1332 01/07/20 0256  AST 30 32  ALT 88* 77*  ALKPHOS 224* 184*  BILITOT 1.5* 1.5*  PROT 7.0 5.7*  ALBUMIN 3.3* 2.7*   Recent Labs  Lab 01/06/20 1332  LIPASE 32   No results for input(s): AMMONIA in the last 168 hours. Coagulation profile Recent Labs  Lab 01/08/20 0457  INR 1.2   COVID-19 Labs  No results for input(s): DDIMER, FERRITIN, LDH, CRP in the last 72 hours.  Lab Results    Component Value Date   SARSCOV2NAA NEGATIVE 01/06/2020   SARSCOV2NAA NEGATIVE 01/06/2020   SARSCOV2NAA Not Detected 03/07/2019    CBC: Recent Labs  Lab 01/06/20 1332 01/07/20 0256  WBC 17.8* 14.5*  HGB 13.5 11.3*  HCT 41.2 33.9*  MCV 94.9 92.9  PLT 233 178   Cardiac Enzymes: No results for input(s): CKTOTAL, CKMB, CKMBINDEX, TROPONINI in the last 168 hours. BNP (last 3 results) No results for input(s): PROBNP in the last 8760 hours. CBG: No results for input(s): GLUCAP in the last 168 hours. D-Dimer: No results for input(s): DDIMER in the last 72 hours. Hgb A1c: No results for input(s): HGBA1C in the last 72 hours. Lipid Profile: No results for input(s): CHOL, HDL, LDLCALC, TRIG, CHOLHDL, LDLDIRECT in the last 72 hours. Thyroid function studies: No results for input(s): TSH, T4TOTAL, T3FREE, THYROIDAB in the last 72 hours.  Invalid input(s): FREET3 Anemia work up: No results for input(s): VITAMINB12, FOLATE, FERRITIN, TIBC, IRON, RETICCTPCT in the last 72 hours. Sepsis Labs: Recent Labs  Lab 01/06/20 1332 01/06/20 2112 01/07/20 0009 01/07/20 0256  WBC 17.8*  --   --  14.5*  LATICACIDVEN  --  1.0 0.8  --    Microbiology Recent Results (from the past 240 hour(s))  SARS CORONAVIRUS 2 (TAT 6-24 HRS) Nasopharyngeal Nasopharyngeal Swab     Status: None   Collection Time: 01/06/20 11:22 AM   Specimen: Nasopharyngeal Swab  Result Value Ref Range Status   SARS Coronavirus 2 NEGATIVE NEGATIVE Final    Comment: (NOTE) SARS-CoV-2 target nucleic acids are NOT DETECTED.  The SARS-CoV-2 RNA is generally detectable in upper and lower respiratory specimens during the acute phase of infection. Negative results do not preclude SARS-CoV-2 infection, do not rule out co-infections with other pathogens, and should not be used as the sole basis for treatment or other patient management decisions. Negative results must be combined with clinical observations, patient history, and  epidemiological information. The expected result is Negative.  Fact Sheet for Patients: HairSlick.no  Fact Sheet for Healthcare Providers: quierodirigir.com  This test is not yet approved or cleared by the [place] FDA and  has been authorized for detection and/or diagnosis of SARS-CoV-2 by FDA under an Emergency Use Authorization (EUA). This EUA will remain  in effect (meaning this test can be used) for the duration of the COVID-19 declaration under Se ction 564(b)(1) of the Act, 21 U.S.C. section 360bbb-3(b)(1), unless the authorization is terminated or revoked sooner.  Performed at [facility]  Lab, [address], [place], [place] [address]   SARS Coronavirus 2 by RT PCR (hospital order, performed in [facility] hospital lab) Nasopharyngeal Nasopharyngeal Swab     Status: None   Collection Time: 01/06/20 10:14 PM   Specimen: Nasopharyngeal Swab  Result Value Ref Range Status   SARS Coronavirus 2 NEGATIVE NEGATIVE Final    Comment: (NOTE) SARS-CoV-2 target nucleic acids are NOT DETECTED.  The SARS-CoV-2 RNA is generally detectable in upper and lower respiratory specimens during the acute phase of infection. The lowest concentration of SARS-CoV-2 viral copies this assay can detect is 250 copies / mL. A negative result does not preclude SARS-CoV-2 infection and should not be used as the sole basis for treatment or other patient management decisions.  A negative result may occur with improper specimen collection / handling, submission of specimen other than nasopharyngeal swab, presence of viral mutation(s) within the areas targeted by this assay, and inadequate number of viral copies (<250 copies / mL). A negative result must be combined with clinical observations, patient history, and epidemiological information.  Fact Sheet for Patients:   BoilerBrush.com.cy  Fact Sheet for Healthcare  Providers: https://pope.com/  This test is not yet approved or  cleared by the [place] FDA and has been authorized for detection and/or diagnosis of SARS-CoV-2 by FDA under an Emergency Use Authorization (EUA).  This EUA will remain in effect (meaning this test can be used) for the duration of the COVID-19 declaration under Section 564(b)(1) of the Act, 21 U.S.C. section 360bbb-3(b)(1), unless the authorization is terminated or revoked sooner.  Performed at [facility], [address] [place], [place] [address]   Culture, blood (routine x 2)     Status: None (Preliminary result)   Collection Time: 01/07/20 12:08 AM   Specimen: BLOOD LEFT HAND  Result Value Ref Range Status   Specimen Description BLOOD LEFT HAND  Final   Special Requests   Final    BOTTLES DRAWN AEROBIC AND ANAEROBIC Blood Culture results may not be optimal due to an excessive volume of blood received in culture bottles   Culture   Final    NO GROWTH 2 DAYS Performed at [facility], [address] [place], [place] [address]    Report Status PENDING  Incomplete  Culture, blood (routine x 2)     Status: None (Preliminary result)   Collection Time: 01/07/20 12:10 AM   Specimen: BLOOD RIGHT HAND  Result Value Ref Range Status   Specimen Description BLOOD RIGHT HAND  Final   Special Requests   Final    BOTTLES DRAWN AEROBIC AND ANAEROBIC Blood Culture adequate volume   Culture   Final    NO GROWTH 2 DAYS Performed at [facility], [address] [place], [place] [address]    Report Status PENDING  Incomplete     Medications:   . fentaNYL      . gelatin adsorbable      . lidocaine      . midazolam      . polyethylene glycol  17 g Oral Daily   Continuous Infusions: . cefTRIAXone (ROCEPHIN)  IV Stopped (01/09/20 0700)  . metronidazole Stopped (01/09/20 0949)      LOS: 2 days   Marinda Elk  [facility]  01/09/2020, 11:01 AM

## 2020-01-09 NOTE — Progress Notes (Signed)
No high fever last night.  Currently reasonably comfortable.  Abdomen nontender.  No new labs.  Awaiting IR aspiration of liver lesion.  Florencia Reasons, M.D. Pager [phone] If no answer or after 5 PM call [phone]

## 2020-01-10 DIAGNOSIS — K750 Abscess of liver: Principal | ICD-10-CM

## 2020-01-10 LAB — COMPREHENSIVE METABOLIC PANEL
ALT: 38 U/L (ref 0–44)
AST: 12 U/L — ABNORMAL LOW (ref 15–41)
Albumin: 2.3 g/dL — ABNORMAL LOW (ref 3.5–5.0)
Alkaline Phosphatase: 160 U/L — ABNORMAL HIGH (ref 38–126)
Anion gap: 8 (ref 5–15)
BUN: 11 mg/dL (ref 6–20)
CO2: 24 mmol/L (ref 22–32)
Calcium: 8.9 mg/dL (ref 8.9–10.3)
Chloride: 105 mmol/L (ref 98–111)
Creatinine, Ser: 0.45 mg/dL (ref 0.44–1.00)
GFR calc Af Amer: >60 mL/min (ref >60)
GFR calc non Af Amer: >60 mL/min (ref >60)
Glucose, Bld: 147 mg/dL — ABNORMAL HIGH (ref 70–99)
Potassium: 3.1 mmol/L — ABNORMAL LOW (ref 3.5–5.1)
Sodium: 137 mmol/L (ref 135–145)
Total Bilirubin: 0.7 mg/dL (ref 0.3–1.2)
Total Protein: 5.3 g/dL — ABNORMAL LOW (ref 6.5–8.1)

## 2020-01-10 LAB — CBC
HCT: 32.6 % — ABNORMAL LOW (ref 36.0–46.0)
Hemoglobin: 11 g/dL — ABNORMAL LOW (ref 12.0–15.0)
MCH: 31 pg (ref 26.0–34.0)
MCHC: 33.7 g/dL (ref 30.0–36.0)
MCV: 91.8 fL (ref 80.0–100.0)
Platelets: 225 10*3/uL (ref 150–400)
RBC: 3.55 MIL/uL — ABNORMAL LOW (ref 3.87–5.11)
RDW: 12.2 % (ref 11.5–15.5)
WBC: 9.2 10*3/uL (ref 4.0–10.5)
nRBC: 0 % (ref 0.0–0.2)

## 2020-01-10 LAB — CYTOLOGY - NON PAP

## 2020-01-10 MED ORDER — OXYCODONE-ACETAMINOPHEN 5-325 MG PO TABS: 1.0000 | ORAL_TABLET | ORAL | Status: DC | PRN

## 2020-01-10 MED ORDER — POTASSIUM CHLORIDE CRYS ER 20 MEQ PO TBCR
40.0000 meq | EXTENDED_RELEASE_TABLET | Freq: Two times a day (BID) | ORAL | Status: AC
  Administered 2020-01-10 (×2): 40 meq via ORAL
  Filled 2020-01-10 (×2): qty 2

## 2020-01-10 MED ORDER — OXYCODONE-ACETAMINOPHEN 5-325 MG PO TABS
1.0000 | ORAL_TABLET | ORAL | Status: DC | PRN
  Administered 2020-01-10 – 2020-01-12 (×2): 1 via ORAL
  Administered 2020-01-12: 2 via ORAL
  Filled 2020-01-10 (×2): qty 2
  Filled 2020-01-10: qty 1

## 2020-01-10 NOTE — Progress Notes (Signed)
Cytology on yesterday's liver lesion aspiration is negative for malignant cells, and shows multiple polys consistent with abscess.  (Discussed with patient; this goes against, but does not entirely exclude, a malignant origin for her space-occupying lesions of the liver.)  Lipase normal on this admission.  Agree with allowing regular diet.  Impression:  1.  Probable hepatic abscesses; on antibiotic therapy; etiology unclear.  2.  Chronic idiopathic pancreatitis with stable radiographic findings  3.  Probable gallbladder sludge  Recommend:  1.  Origin of hepatic abscesses is unclear.  Culture results pending.  I would defer to general surgery as to whether the patient's gallbladder should be removed as a possible origin of hepatic seeding.  2.  Have ordered CA 19-9 level in view of (stable) pancreatic ductal dilatation, as recommended by radiology.  Her level was normal 4 years ago, but it should be kept in mind that mild elevations of this antigen can be seen in chronic pancreatitis.  The patient was advised to call my office (card provided) if we do not go over the results of this lab test prior to her discharge.  We will sign off.  Please call us if you would like to discuss her case or if we can be of further assistance in her care.  Florencia Reasons, M.D. Pager [phone] If no answer or after 5 PM call [phone]

## 2020-01-10 NOTE — Plan of Care (Signed)
  Problem: Nutrition: Goal: Adequate nutrition will be maintained Outcome: Not Progressing   

## 2020-01-10 NOTE — Progress Notes (Signed)
[facility] PROGRESS NOTE    Progress Note  Shiza Thelen  UXL:[id number] DOB: August 18, 1965 DOA: 01/06/2020 PCP: [name], NP     Brief Narrative:   Trinidee Schrag is an 54 y.o. female past medical history significant for chronic pancreatitis unknown etiology depression comes in for fever that started 3 days prior to admission and right shoulder pain, and ED she was found to be febrile with an AST of 30 ALT of 88 alkaline phosphatase 224 bilirubin of 1.4, white blood count 17.8 quadrant ultrasound shows sludge without stones but the common bile duct measures about 14 mm. Currently on Rocephin and Flagyl IV fluids surgery has been consulted.  Assessment/Plan:   Multiple liver abscesses: Continue IV Rocephin and Flagyl MRCP showed multiple abscesses. Continue narcotics for pain control.  Appreciate GIs assistance, we have consulted interventional radiology to perform aspiration to see if we could identify the organism. IR will plan for image guided aspiration/biopsy on 01/09/2020 culture results are still pending. She is having nausea and her pain is not controlled, will try narcotics to Percocet. She is able to tolerate her diet she can probably be discharged within 24 to 48 hours.  We will do await for culture results before discharging if inconclusive left next best choice to send her on oral regimen for at least 3 weeks.  Chronic pancreatitis (HCC) Pain control continue IV fluids and narcotics.  Depression Cont home meds.  Hypokalemia: Replete orally.  DVT prophylaxis: lovenox Family Communication:None Status is: Inpatient  Remains inpatient appropriate because:Hemodynamically unstable   Dispo: The patient is from: Home              Anticipated d/c is to: Home              Anticipated d/c date is: 24 to 48 hours              Patient currently is not medically stable to d/c.     Code Status:     Code Status Orders  (From admission, onward)          Start     Ordered   01/07/20 0116  Full code  Continuous        01/07/20 0117        Code Status History    Date Active Date Inactive Code Status Order ID Comments User Context   01/26/2016 2106 01/30/2016 1749 Full Code [id number]  Lily Kocher, MD Inpatient   01/26/2016 1755 01/26/2016 2106 Full Code [id number]  Theodoro Grist, MD ED   Advance Care Planning Activity        IV Access:    Peripheral IV   Procedures and diagnostic studies:   IR US Guide Bx Asp/Drain  Result Date: 01/09/2020 INDICATION: Multiple small cystic appearing liver lesions suspicious for multifocal abscesses by MRI. EXAM: IR ULTRASOUND GUIDANCE MEDICATIONS: The patient is currently admitted to the hospital and receiving intravenous antibiotics. The antibiotics were administered within an appropriate time frame prior to the initiation of the procedure. ANESTHESIA/SEDATION: Fentanyl 75 mcg IV; Versed 1.5 mg IV Moderate Sedation Time:  13 minutes. The patient was continuously monitored during the procedure by the interventional radiology nurse under my direct supervision. COMPLICATIONS: None immediate. PROCEDURE: Informed written consent was obtained from the patient after a thorough discussion of the procedural risks, benefits and alternatives. All questions were addressed. Maximal Sterile Barrier Technique was utilized including caps, mask, sterile gowns, sterile gloves, sterile drape, hand hygiene and skin antiseptic. A timeout was performed  prior to the initiation of the procedure. Ultrasound was performed of the liver. Under direct ultrasound guidance, an 18 gauge trocar needle was advanced into the right lobe at the level of 2 adjacent cystic abnormalities. Aspiration was performed and material split for both culture analysis and cytologic analysis. FINDINGS: Multiple irregular hypoechoic abnormalities are seen scattered throughout the liver parenchyma. Two adjacent abnormalities were targeted in the right lobe measuring  approximately 1.0 and 1.5 cm in respective maximal diameter. Both yielded thick, purulent fluid with a total of approximately 3 mL of total fluid volume removed. This resulted in decompression by ultrasound of the cystic component of the abnormalities. IMPRESSION: Aspiration of 2 adjacent hypoechoic abnormalities in the right lobe of the liver yielded thick, purulent fluid. A total of 3 mL of fluid was removed resulting in decompression by ultrasound. Aspirated fluid was sent for both culture analysis and cytologic analysis. Electronically Signed   By: Irish Lack M.D.   On: 01/09/2020 14:26     Medical Consultants:    None.  Anti-Infectives:   IV Rocephin and Flagyl  Subjective:    Kizzie Bane pain is not controlled she is nauseated, not tolerating her diet due to nausea.  Objective:    Vitals:   01/09/20 1115 01/09/20 1251 01/09/20 2019 01/10/20 0422  BP: 137/71  117/74 130/73  Pulse: 69  73 74  Resp: (!) 29 (!) 24 16 16   Temp:  99.4 F (37.4 C) 97.7 F (36.5 C) 97.6 F (36.4 C)  TempSrc:  Oral Oral Oral  SpO2: 94%  92% 92%  Weight:   72.6 kg   Height:       SpO2: 92 % O2 Flow Rate (L/min): 2 L/min   Intake/Output Summary (Last 24 hours) at 01/10/2020 0922 Last data filed at 01/10/2020 0600 Gross per 24 hour  Intake 574.02 ml  Output 0 ml  Net 574.02 ml   Filed Weights   01/06/20 1328 01/08/20 2105 01/09/20 2019  Weight: 71.2 kg 74.1 kg 72.6 kg    Exam: General exam: In no acute distress. Respiratory system: Good air movement and clear to auscultation. Cardiovascular system: S1 & S2 heard, RRR. No JVD. Gastrointestinal system: Abdomen is nondistended, soft and nontender.  Extremities: No pedal edema. Skin: No rashes, lesions or ulcers Psychiatry: Judgement and insight appear normal. Mood & affect appropriate.  Data Reviewed:    Labs: Basic Metabolic Panel: Recent Labs  Lab 01/06/20 1332 01/06/20 1332 01/07/20 0256 01/10/20 0551  NA 134*  --   135 137  K 3.9   < > 3.6 3.1*  CL 102  --  107 105  CO2 21*  --  20* 24  GLUCOSE 119*  --  121* 147*  BUN 14  --  14 11  CREATININE 0.82  --  0.74 0.45  CALCIUM 9.6  --  9.1 8.9   < > = values in this interval not displayed.   GFR Estimated Creatinine Clearance: 77.6 mL/min (by C-G formula based on SCr of 0.45 mg/dL). Liver Function Tests: Recent Labs  Lab 01/06/20 1332 01/07/20 0256 01/10/20 0551  AST 30 32 12*  ALT 88* 77* 38  ALKPHOS 224* 184* 160*  BILITOT 1.5* 1.5* 0.7  PROT 7.0 5.7* 5.3*  ALBUMIN 3.3* 2.7* 2.3*   Recent Labs  Lab 01/06/20 1332  LIPASE 32   No results for input(s): AMMONIA in the last 168 hours. Coagulation profile Recent Labs  Lab 01/08/20 0457  INR 1.2   COVID-19 Labs  No results for input(s): DDIMER, FERRITIN, LDH, CRP in the last 72 hours.  Lab Results  Component Value Date   SARSCOV2NAA NEGATIVE 01/06/2020   SARSCOV2NAA NEGATIVE 01/06/2020   SARSCOV2NAA Not Detected 03/07/2019    CBC: Recent Labs  Lab 01/06/20 1332 01/07/20 0256 01/10/20 0551  WBC 17.8* 14.5* 9.2  HGB 13.5 11.3* 11.0*  HCT 41.2 33.9* 32.6*  MCV 94.9 92.9 91.8  PLT 233 178 225   Cardiac Enzymes: No results for input(s): CKTOTAL, CKMB, CKMBINDEX, TROPONINI in the last 168 hours. BNP (last 3 results) No results for input(s): PROBNP in the last 8760 hours. CBG: No results for input(s): GLUCAP in the last 168 hours. D-Dimer: No results for input(s): DDIMER in the last 72 hours. Hgb A1c: No results for input(s): HGBA1C in the last 72 hours. Lipid Profile: No results for input(s): CHOL, HDL, LDLCALC, TRIG, CHOLHDL, LDLDIRECT in the last 72 hours. Thyroid function studies: No results for input(s): TSH, T4TOTAL, T3FREE, THYROIDAB in the last 72 hours.  Invalid input(s): FREET3 Anemia work up: No results for input(s): VITAMINB12, FOLATE, FERRITIN, TIBC, IRON, RETICCTPCT in the last 72 hours. Sepsis Labs: Recent Labs  Lab 01/06/20 1332 01/06/20 2112  01/07/20 0009 01/07/20 0256 01/10/20 0551  WBC 17.8*  --   --  14.5* 9.2  LATICACIDVEN  --  1.0 0.8  --   --    Microbiology Recent Results (from the past 240 hour(s))  SARS CORONAVIRUS 2 (TAT 6-24 HRS) Nasopharyngeal Nasopharyngeal Swab     Status: None   Collection Time: 01/06/20 11:22 AM   Specimen: Nasopharyngeal Swab  Result Value Ref Range Status   SARS Coronavirus 2 NEGATIVE NEGATIVE Final    Comment: (NOTE) SARS-CoV-2 target nucleic acids are NOT DETECTED.  The SARS-CoV-2 RNA is generally detectable in upper and lower respiratory specimens during the acute phase of infection. Negative results do not preclude SARS-CoV-2 infection, do not rule out co-infections with other pathogens, and should not be used as the sole basis for treatment or other patient management decisions. Negative results must be combined with clinical observations, patient history, and epidemiological information. The expected result is Negative.  Fact Sheet for Patients: HairSlick.no  Fact Sheet for Healthcare Providers: quierodirigir.com  This test is not yet approved or cleared by the [place] FDA and  has been authorized for detection and/or diagnosis of SARS-CoV-2 by FDA under an Emergency Use Authorization (EUA). This EUA will remain  in effect (meaning this test can be used) for the duration of the COVID-19 declaration under Se ction 564(b)(1) of the Act, 21 U.S.C. section 360bbb-3(b)(1), unless the authorization is terminated or revoked sooner.  Performed at [facility], [address] [place], [place] [address]   SARS Coronavirus 2 by RT PCR (hospital order, performed in [facility] hospital lab) Nasopharyngeal Nasopharyngeal Swab     Status: None   Collection Time: 01/06/20 10:14 PM   Specimen: Nasopharyngeal Swab  Result Value Ref Range Status   SARS Coronavirus 2 NEGATIVE NEGATIVE Final    Comment:  (NOTE) SARS-CoV-2 target nucleic acids are NOT DETECTED.  The SARS-CoV-2 RNA is generally detectable in upper and lower respiratory specimens during the acute phase of infection. The lowest concentration of SARS-CoV-2 viral copies this assay can detect is 250 copies / mL. A negative result does not preclude SARS-CoV-2 infection and should not be used as the sole basis for treatment or other patient management decisions.  A negative result may occur with improper specimen collection /  handling, submission of specimen other than nasopharyngeal swab, presence of viral mutation(s) within the areas targeted by this assay, and inadequate number of viral copies (<250 copies / mL). A negative result must be combined with clinical observations, patient history, and epidemiological information.  Fact Sheet for Patients:   BoilerBrush.com.cy  Fact Sheet for Healthcare Providers: https://pope.com/  This test is not yet approved or  cleared by the [place] FDA and has been authorized for detection and/or diagnosis of SARS-CoV-2 by FDA under an Emergency Use Authorization (EUA).  This EUA will remain in effect (meaning this test can be used) for the duration of the COVID-19 declaration under Section 564(b)(1) of the Act, 21 U.S.C. section 360bbb-3(b)(1), unless the authorization is terminated or revoked sooner.  Performed at [facility], [address], [place], [place] [address]   Culture, blood (routine x 2)     Status: None (Preliminary result)   Collection Time: 01/07/20 12:08 AM   Specimen: BLOOD LEFT HAND  Result Value Ref Range Status   Specimen Description BLOOD LEFT HAND  Final   Special Requests   Final    BOTTLES DRAWN AEROBIC AND ANAEROBIC Blood Culture results may not be optimal due to an excessive volume of blood received in culture bottles   Culture   Final    NO GROWTH 3 DAYS Performed at [facility],  [address] [place], [place] [address]    Report Status PENDING  Incomplete  Culture, blood (routine x 2)     Status: None (Preliminary result)   Collection Time: 01/07/20 12:10 AM   Specimen: BLOOD RIGHT HAND  Result Value Ref Range Status   Specimen Description BLOOD RIGHT HAND  Final   Special Requests   Final    BOTTLES DRAWN AEROBIC AND ANAEROBIC Blood Culture adequate volume   Culture   Final    NO GROWTH 3 DAYS Performed at [facility], [address], [place], [place] [address]    Report Status PENDING  Incomplete  Culture, Urine     Status: None   Collection Time: 01/07/20  1:28 AM   Specimen: Urine, Random  Result Value Ref Range Status   Specimen Description URINE, RANDOM  Final   Special Requests NONE  Final   Culture   Final    NO GROWTH Performed at [facility], [address], [place], [place] [address]    Report Status 01/09/2020 FINAL  Final  Aerobic/Anaerobic Culture (surgical/deep wound)     Status: None (Preliminary result)   Collection Time: 01/09/20 11:30 AM   Specimen: Abscess  Result Value Ref Range Status   Specimen Description ABSCESS  Final   Special Requests LIVER  Final   Gram Stain   Final    NO WBC SEEN NO ORGANISMS SEEN Performed at [facility], [address] [place], [place] [address]    Culture PENDING  Incomplete   Report Status PENDING  Incomplete     Medications:   . metroNIDAZOLE  500 mg Oral Q8H  . polyethylene glycol  17 g Oral Daily   Continuous Infusions: . cefTRIAXone (ROCEPHIN)  IV 2 g (01/09/20 2116)      LOS: 3 days   Marinda Elk  [facility]  01/10/2020, 9:22 AM

## 2020-01-10 NOTE — Plan of Care (Signed)
  Problem: Health Behavior/Discharge Planning: Goal: Ability to manage health-related needs will improve Outcome: Completed/Met   Problem: Clinical Measurements: Goal: Respiratory complications will improve Outcome: Completed/Met   Problem: Nutrition: Goal: Adequate nutrition will be maintained Outcome: Completed/Met   Problem: Safety: Goal: Ability to remain free from injury will improve Outcome: Completed/Met

## 2020-01-11 DIAGNOSIS — F320 Major depressive disorder, single episode, mild: Secondary | ICD-10-CM

## 2020-01-11 DIAGNOSIS — R1011 Right upper quadrant pain: Secondary | ICD-10-CM

## 2020-01-11 LAB — CBC WITH DIFFERENTIAL/PLATELET
Abs Immature Granulocytes: 0.08 10*3/uL — ABNORMAL HIGH (ref 0.00–0.07)
Basophils Absolute: 0 10*3/uL (ref 0.0–0.1)
Basophils Relative: 0 %
Eosinophils Absolute: 0 10*3/uL (ref 0.0–0.5)
Eosinophils Relative: 0 %
HCT: 32.4 % — ABNORMAL LOW (ref 36.0–46.0)
Hemoglobin: 10.7 g/dL — ABNORMAL LOW (ref 12.0–15.0)
Immature Granulocytes: 1 %
Lymphocytes Relative: 13 %
Lymphs Abs: 1.2 10*3/uL (ref 0.7–4.0)
MCH: 30.8 pg (ref 26.0–34.0)
MCHC: 33 g/dL (ref 30.0–36.0)
MCV: 93.4 fL (ref 80.0–100.0)
Monocytes Absolute: 0.7 10*3/uL (ref 0.1–1.0)
Monocytes Relative: 7 %
Neutro Abs: 7.3 10*3/uL (ref 1.7–7.7)
Neutrophils Relative %: 79 %
Platelets: 271 10*3/uL (ref 150–400)
RBC: 3.47 MIL/uL — ABNORMAL LOW (ref 3.87–5.11)
RDW: 12.3 % (ref 11.5–15.5)
WBC: 9.4 10*3/uL (ref 4.0–10.5)
nRBC: 0 % (ref 0.0–0.2)

## 2020-01-11 LAB — COMPREHENSIVE METABOLIC PANEL
ALT: 29 U/L (ref 0–44)
AST: 16 U/L (ref 15–41)
Albumin: 2.1 g/dL — ABNORMAL LOW (ref 3.5–5.0)
Alkaline Phosphatase: 139 U/L — ABNORMAL HIGH (ref 38–126)
Anion gap: 8 (ref 5–15)
BUN: 7 mg/dL (ref 6–20)
CO2: 25 mmol/L (ref 22–32)
Calcium: 8.6 mg/dL — ABNORMAL LOW (ref 8.9–10.3)
Chloride: 107 mmol/L (ref 98–111)
Creatinine, Ser: 0.65 mg/dL (ref 0.44–1.00)
GFR calc Af Amer: >60 mL/min (ref >60)
GFR calc non Af Amer: >60 mL/min (ref >60)
Glucose, Bld: 142 mg/dL — ABNORMAL HIGH (ref 70–99)
Potassium: 3.2 mmol/L — ABNORMAL LOW (ref 3.5–5.1)
Sodium: 140 mmol/L (ref 135–145)
Total Bilirubin: 0.5 mg/dL (ref 0.3–1.2)
Total Protein: 5.2 g/dL — ABNORMAL LOW (ref 6.5–8.1)

## 2020-01-11 LAB — LIPASE, BLOOD: Lipase: 48 U/L (ref 11–51)

## 2020-01-11 LAB — PHOSPHORUS: Phosphorus: 1.7 mg/dL — ABNORMAL LOW (ref 2.5–4.6)

## 2020-01-11 LAB — MAGNESIUM: Magnesium: 1.9 mg/dL (ref 1.7–2.4)

## 2020-01-11 MED ORDER — POTASSIUM PHOSPHATES 15 MMOLE/5ML IV SOLN
40.0000 mmol | Freq: Once | INTRAVENOUS | Status: AC
  Administered 2020-01-11: 40 mmol via INTRAVENOUS
  Filled 2020-01-11: qty 13.33

## 2020-01-11 MED ORDER — MAGNESIUM SULFATE IN D5W 1-5 GM/100ML-% IV SOLN
1.0000 g | Freq: Once | INTRAVENOUS | Status: AC
  Administered 2020-01-11: 1 g via INTRAVENOUS
  Filled 2020-01-11: qty 100

## 2020-01-11 MED ORDER — HEPARIN SODIUM (PORCINE) 5000 UNIT/ML IJ SOLN
5000.0000 [IU] | Freq: Three times a day (TID) | INTRAMUSCULAR | Status: DC
  Administered 2020-01-11 – 2020-01-13 (×5): 5000 [IU] via SUBCUTANEOUS
  Filled 2020-01-11 (×5): qty 1

## 2020-01-11 MED ORDER — BUPROPION HCL ER (SR) 150 MG PO TB12
150.0000 mg | ORAL_TABLET | Freq: Two times a day (BID) | ORAL | Status: DC
  Administered 2020-01-12 – 2020-01-13 (×3): 150 mg via ORAL
  Filled 2020-01-11 (×5): qty 1

## 2020-01-11 NOTE — Progress Notes (Signed)
Heparin 5000 units SQ q8 for DVT prophylaxis.  Rx signs off  Ulyses Southward, PharmD, [place], AAHIVP, CPP Infectious Disease Pharmacist 01/11/2020 9:23 PM

## 2020-01-11 NOTE — Progress Notes (Signed)
PROGRESS NOTE    Anita Poole  XBJ:[id number] DOB: 1965/10/06 DOA: 01/06/2020 PCP: Doreene Nest, NP     Brief Narrative:  55 y.o. WF PMHx Calc[employer] chronic pancreatitis of unknown etiology, GERD, depression who presents to the ED for evaluation of fevers and right upper quadrant pain.  Patient states she has been in her usual state of health until last week when she began to spike fevers for 3 consecutive days.  The following 3 days she had no fevers and then began to have fevers up to 102 Fahrenheit again on 01/06/2020.  She has had chills, diaphoresis, and some associated nausea without vomiting.  She was having right shoulder pain and went to urgent care for further evaluation.  On exam she had right upper quadrant pain and due to concern for gallbladder disease she was sent to the ED for further evaluation.  Patient reports a history of chronic pancreatitis which she feels has been well controlled and does not feel her current symptoms are similar to her prior pancreatitis symptoms.  She denies any chest pain, dyspnea, cough, dysuria, diarrhea.  She denies any recent medication changes.  She reports smoking about 2-3 cigarettes/day.  She denies any alcohol or illicit drug use.  RUQ ultrasound shows gallbladder sludge without evidence of gallstones. Dilated CBD measuring 13.8 mm is seen. No obstructing stones or masses are identified. Right hepatic lobe hypoechoic structures are seen suggestive of hepatic cysts.  Patient was given 1 L normal saline, Zofran, morphine. EDP discussed the case with on-call general surgery who will see in consultation. Patient was given IV ceftriaxone and Flagyl and the hospitalist service was consulted to admit for further evaluation and management.  Subjective: T-max last 24 hours 38.1 C, negative S OB, negative CP, positive abdominal pain with palpation.   Assessment & Plan: Covid vaccination; no vaccination   Principal Problem:   Hepatic  abscess Active Problems:   Chronic pancreatitis (HCC)   Depression   Multiple liver abscesses: -Continue IV Rocephin and Flagyl MRCP showed multiple abscesses. -Continue narcotics for pain control.  Appreciate GIs assistance, we have consulted interventional radiology to perform aspiration to see if we could identify the organism. -S/p IR will plan for image guided aspiration/biopsy on 01/09/2020 culture results are still pending. -6/23 will consult ID in A.m. await final cultures?  Antibiotics to discharge patient on and length of treatment?  Follow-up in ID clinic?  .  Chronic pancreatitis (HCC) -Pain control continue IV fluids and narcotics.  Depression -Wellbutrin 150 mg  BID.  Hypokalemia -K-Phos 40 mmol  Hypophosphatemia -See hypokalemia  Hypomagnesmia -Magnesium IV 1 g .    DVT prophylaxis: Heparin subcu Code Status: Full Family Communication:  Status is: Inpatient    Dispo: The patient is from: Home              Anticipated d/c is to:               Anticipated d/c date is: 6/26              Patient currently unstable continues to spike fevers no plan for home antibiotic coverage      Consultants:    Procedures/Significant Events:    I have personally reviewed and interpreted all radiology studies and my findings are as above.  VENTILATOR SETTINGS:    Cultures   Antimicrobials: Anti-infectives (From admission, onward)   Start     Dose/Rate Stop   01/09/20 1600  metroNIDAZOLE (FLAGYL) tablet 500 mg  Discontinue     500 mg     01/07/20 2200  cefTRIAXone (ROCEPHIN) 2 g in sodium chloride 0.9 % 100 mL IVPB     Discontinue     2 g 200 mL/hr over 30 Minutes     01/07/20 0800  metroNIDAZOLE (FLAGYL) IVPB 500 mg  Status:  Discontinued        500 mg 100 mL/hr over 60 Minutes 01/09/20 1108   01/07/20 0030  metroNIDAZOLE (FLAGYL) IVPB 500 mg        500 mg 100 mL/hr over 60 Minutes 01/07/20 0218   01/06/20 2230  cefTRIAXone (ROCEPHIN) 2 g in  sodium chloride 0.9 % 100 mL IVPB        2 g 200 mL/hr over 30 Minutes 01/07/20 0110       Devices    LINES / TUBES:      Continuous Infusions: . cefTRIAXone (ROCEPHIN)  IV 2 g (01/10/20 2127)     Objective: Vitals:   01/10/20 1719 01/10/20 2055 01/11/20 0457 01/11/20 0938  BP: 129/66 (!) 91/52 117/69 (!) 109/57  Pulse: 67 83 73 70  Resp: 18 18 18 18   Temp: 98.9 F (37.2 C) 99.3 F (37.4 C) (!) 100.5 F (38.1 C) 97.9 F (36.6 C)  TempSrc: Oral Oral Oral Oral  SpO2: 93% (!) 89% 91% 95%  Weight:   73.5 kg   Height:        Intake/Output Summary (Last 24 hours) at 01/11/2020 8416 Last data filed at 01/11/2020 0700 Gross per 24 hour  Intake 460 ml  Output 1 ml  Net 459 ml   Filed Weights   01/08/20 2105 01/09/20 2019 01/11/20 0457  Weight: 74.1 kg 72.6 kg 73.5 kg    Examination:  General: A/O x4, no acute respiratory distress Eyes: negative scleral hemorrhage, negative anisocoria, negative icterus ENT: Negative Runny nose, negative gingival bleeding, Neck:  Negative scars, masses, torticollis, lymphadenopathy, JVD Lungs: Clear to auscultation bilaterally without wheezes or crackles Cardiovascular: Regular rate and rhythm without murmur gallop or rub normal S1 and S2 Abdomen: Positive RUQ abdominal pain, nondistended, positive soft, bowel sounds, no rebound, no ascites, no appreciable mass Extremities: No significant cyanosis, clubbing, or edema bilateral lower extremities Skin: Negative rashes, lesions, ulcers Psychiatric:  Negative depression, negative anxiety, negative fatigue, negative mania  Central nervous system:  Cranial nerves II through XII intact, tongue/uvula midline, all extremities muscle strength 5/5, sensation intact throughout, negative dysarthria, negative expressive aphasia, negative receptive aphasia.  .     Data Reviewed: Care during the described time interval was provided by me .  I have reviewed this patient's available data,  including medical history, events of note, physical examination, and all test results as part of my evaluation.  CBC: Recent Labs  Lab 01/06/20 1332 01/07/20 0256 01/10/20 0551  WBC 17.8* 14.5* 9.2  HGB 13.5 11.3* 11.0*  HCT 41.2 33.9* 32.6*  MCV 94.9 92.9 91.8  PLT 233 178 606   Basic Metabolic Panel: Recent Labs  Lab 01/06/20 1332 01/07/20 0256 01/10/20 0551  NA 134* 135 137  K 3.9 3.6 3.1*  CL 102 107 105  CO2 21* 20* 24  GLUCOSE 119* 121* 147*  BUN 14 14 11   CREATININE 0.82 0.74 0.45  CALCIUM 9.6 9.1 8.9   GFR: Estimated Creatinine Clearance: 77.1 mL/min (by C-G formula based on SCr of 0.45 mg/dL). Liver Function Tests: Recent Labs  Lab 01/06/20 1332 01/07/20 0256 01/10/20 0551  AST 30 32 12*  ALT  88* 77* 38  ALKPHOS 224* 184* 160*  BILITOT 1.5* 1.5* 0.7  PROT 7.0 5.7* 5.3*  ALBUMIN 3.3* 2.7* 2.3*   Recent Labs  Lab 01/06/20 1332  LIPASE 32   No results for input(s): AMMONIA in the last 168 hours. Coagulation Profile: Recent Labs  Lab 01/08/20 0457  INR 1.2   Cardiac Enzymes: No results for input(s): CKTOTAL, CKMB, CKMBINDEX, TROPONINI in the last 168 hours. BNP (last 3 results) No results for input(s): PROBNP in the last 8760 hours. HbA1C: No results for input(s): HGBA1C in the last 72 hours. CBG: No results for input(s): GLUCAP in the last 168 hours. Lipid Profile: No results for input(s): CHOL, HDL, LDLCALC, TRIG, CHOLHDL, LDLDIRECT in the last 72 hours. Thyroid Function Tests: No results for input(s): TSH, T4TOTAL, FREET4, T3FREE, THYROIDAB in the last 72 hours. Anemia Panel: No results for input(s): VITAMINB12, FOLATE, FERRITIN, TIBC, IRON, RETICCTPCT in the last 72 hours. Sepsis Labs: Recent Labs  Lab 01/06/20 2112 01/07/20 0009  LATICACIDVEN 1.0 0.8    Recent Results (from the past 240 hour(s))  SARS CORONAVIRUS 2 (TAT 6-24 HRS) Nasopharyngeal Nasopharyngeal Swab     Status: None   Collection Time: 01/06/20 11:22 AM    Specimen: Nasopharyngeal Swab  Result Value Ref Range Status   SARS Coronavirus 2 NEGATIVE NEGATIVE Final    Comment: (NOTE) SARS-CoV-2 target nucleic acids are NOT DETECTED.  The SARS-CoV-2 RNA is generally detectable in upper and lower respiratory specimens during the acute phase of infection. Negative results do not preclude SARS-CoV-2 infection, do not rule out co-infections with other pathogens, and should not be used as the sole basis for treatment or other patient management decisions. Negative results must be combined with clinical observations, patient history, and epidemiological information. The expected result is Negative.  Fact Sheet for Patients: HairSlick.no  Fact Sheet for Healthcare Providers: quierodirigir.com  This test is not yet approved or cleared by the [place] FDA and  has been authorized for detection and/or diagnosis of SARS-CoV-2 by FDA under an Emergency Use Authorization (EUA). This EUA will remain  in effect (meaning this test can be used) for the duration of the COVID-19 declaration under Se ction 564(b)(1) of the Act, 21 U.S.C. section 360bbb-3(b)(1), unless the authorization is terminated or revoked sooner.  Performed at [facility], [address] [place], [place] [address]   SARS Coronavirus 2 by RT PCR (hospital order, performed in [facility] hospital lab) Nasopharyngeal Nasopharyngeal Swab     Status: None   Collection Time: 01/06/20 10:14 PM   Specimen: Nasopharyngeal Swab  Result Value Ref Range Status   SARS Coronavirus 2 NEGATIVE NEGATIVE Final    Comment: (NOTE) SARS-CoV-2 target nucleic acids are NOT DETECTED.  The SARS-CoV-2 RNA is generally detectable in upper and lower respiratory specimens during the acute phase of infection. The lowest concentration of SARS-CoV-2 viral copies this assay can detect is 250 copies / mL. A negative result does not preclude  SARS-CoV-2 infection and should not be used as the sole basis for treatment or other patient management decisions.  A negative result may occur with improper specimen collection / handling, submission of specimen other than nasopharyngeal swab, presence of viral mutation(s) within the areas targeted by this assay, and inadequate number of viral copies (<250 copies / mL). A negative result must be combined with clinical observations, patient history, and epidemiological information.  Fact Sheet for Patients:   BoilerBrush.com.cy  Fact Sheet for Healthcare Providers: https://pope.com/  This test is not yet approved or  cleared by the [place] FDA and has been authorized for detection and/or diagnosis of SARS-CoV-2 by FDA under an Emergency Use Authorization (EUA).  This EUA will remain in effect (meaning this test can be used) for the duration of the COVID-19 declaration under Section 564(b)(1) of the Act, 21 U.S.C. section 360bbb-3(b)(1), unless the authorization is terminated or revoked sooner.  Performed at [facility], [address] [place], [place] [address]   Culture, blood (routine x 2)     Status: None (Preliminary result)   Collection Time: 01/07/20 12:08 AM   Specimen: BLOOD LEFT HAND  Result Value Ref Range Status   Specimen Description BLOOD LEFT HAND  Final   Special Requests   Final    BOTTLES DRAWN AEROBIC AND ANAEROBIC Blood Culture results may not be optimal due to an excessive volume of blood received in culture bottles   Culture   Final    NO GROWTH 4 DAYS Performed at [facility], [address] [place], [place] [address]    Report Status PENDING  Incomplete  Culture, blood (routine x 2)     Status: None (Preliminary result)   Collection Time: 01/07/20 12:10 AM   Specimen: BLOOD RIGHT HAND  Result Value Ref Range Status   Specimen Description BLOOD RIGHT HAND  Final   Special Requests    Final    BOTTLES DRAWN AEROBIC AND ANAEROBIC Blood Culture adequate volume   Culture   Final    NO GROWTH 4 DAYS Performed at [facility], [address] [place], [place] [address]    Report Status PENDING  Incomplete  Culture, Urine     Status: None   Collection Time: 01/07/20  1:28 AM   Specimen: Urine, Random  Result Value Ref Range Status   Specimen Description URINE, RANDOM  Final   Special Requests NONE  Final   Culture   Final    NO GROWTH Performed at [facility], [address] [place], [place] [address]    Report Status 01/09/2020 FINAL  Final  Aerobic/Anaerobic Culture (surgical/deep wound)     Status: None (Preliminary result)   Collection Time: 01/09/20 11:30 AM   Specimen: Abscess  Result Value Ref Range Status   Specimen Description ABSCESS  Final   Special Requests LIVER  Final   Gram Stain NO WBC SEEN NO ORGANISMS SEEN   Final   Culture   Final    NO GROWTH 2 DAYS Performed at [facility], [address] [place], [place] [address]    Report Status PENDING  Incomplete         Radiology Studies: IR US Guide Bx Asp/Drain  Result Date: 01/09/2020 INDICATION: Multiple small cystic appearing liver lesions suspicious for multifocal abscesses by MRI. EXAM: IR ULTRASOUND GUIDANCE MEDICATIONS: The patient is currently admitted to the hospital and receiving intravenous antibiotics. The antibiotics were administered within an appropriate time frame prior to the initiation of the procedure. ANESTHESIA/SEDATION: Fentanyl 75 mcg IV; Versed 1.5 mg IV Moderate Sedation Time:  13 minutes. The patient was continuously monitored during the procedure by the interventional radiology nurse under my direct supervision. COMPLICATIONS: None immediate. PROCEDURE: Informed written consent was obtained from the patient after a thorough discussion of the procedural risks, benefits and alternatives. All questions were addressed. Maximal Sterile Barrier Technique was  utilized including caps, mask, sterile gowns, sterile gloves, sterile drape, hand hygiene and  skin antiseptic. A timeout was performed prior to the initiation of the procedure. Ultrasound was performed of the liver. Under direct ultrasound guidance, an 18 gauge trocar needle was advanced into the right lobe at the level of 2 adjacent cystic abnormalities. Aspiration was performed and material split for both culture analysis and cytologic analysis. FINDINGS: Multiple irregular hypoechoic abnormalities are seen scattered throughout the liver parenchyma. Two adjacent abnormalities were targeted in the right lobe measuring approximately 1.0 and 1.5 cm in respective maximal diameter. Both yielded thick, purulent fluid with a total of approximately 3 mL of total fluid volume removed. This resulted in decompression by ultrasound of the cystic component of the abnormalities. IMPRESSION: Aspiration of 2 adjacent hypoechoic abnormalities in the right lobe of the liver yielded thick, purulent fluid. A total of 3 mL of fluid was removed resulting in decompression by ultrasound. Aspirated fluid was sent for both culture analysis and cytologic analysis. Electronically Signed   By: Irish Lack M.D.   On: 01/09/2020 14:26        Scheduled Meds: . metroNIDAZOLE  500 mg Oral Q8H  . polyethylene glycol  17 g Oral Daily   Continuous Infusions: . cefTRIAXone (ROCEPHIN)  IV 2 g (01/10/20 2127)     LOS: 4 days    Time spent:40 min    Tremel Setters, Roselind Messier, MD [facility] Pager [phone]  If 7PM-7AM, please contact night-coverage [email or web address] Password [facility] 01/11/2020, 9:42 AM

## 2020-01-11 NOTE — Plan of Care (Signed)
  Problem: Health Behavior/Discharge Planning: Goal: Ability to manage health-related needs will improve Outcome: Completed/Met   Problem: Clinical Measurements: Goal: Ability to maintain clinical measurements within normal limits will improve Outcome: Completed/Met Goal: Will remain free from infection Outcome: Completed/Met Goal: Respiratory complications will improve Outcome: Completed/Met   Problem: Nutrition: Goal: Adequate nutrition will be maintained Outcome: Completed/Met   Problem: Safety: Goal: Ability to remain free from injury will improve Outcome: Completed/Met

## 2020-01-12 ENCOUNTER — Inpatient Hospital Stay: Payer: Self-pay

## 2020-01-12 LAB — CBC WITH DIFFERENTIAL/PLATELET
Abs Immature Granulocytes: 0.1 10*3/uL — ABNORMAL HIGH (ref 0.00–0.07)
Basophils Absolute: 0 10*3/uL (ref 0.0–0.1)
Basophils Relative: 0 %
Eosinophils Absolute: 0 10*3/uL (ref 0.0–0.5)
Eosinophils Relative: 0 %
HCT: 33.2 % — ABNORMAL LOW (ref 36.0–46.0)
Hemoglobin: 10.7 g/dL — ABNORMAL LOW (ref 12.0–15.0)
Immature Granulocytes: 1 %
Lymphocytes Relative: 13 %
Lymphs Abs: 1.2 10*3/uL (ref 0.7–4.0)
MCH: 30.2 pg (ref 26.0–34.0)
MCHC: 32.2 g/dL (ref 30.0–36.0)
MCV: 93.8 fL (ref 80.0–100.0)
Monocytes Absolute: 0.5 10*3/uL (ref 0.1–1.0)
Monocytes Relative: 5 %
Neutro Abs: 7.2 10*3/uL (ref 1.7–7.7)
Neutrophils Relative %: 81 %
Platelets: 286 10*3/uL (ref 150–400)
RBC: 3.54 MIL/uL — ABNORMAL LOW (ref 3.87–5.11)
RDW: 12.3 % (ref 11.5–15.5)
WBC: 9 10*3/uL (ref 4.0–10.5)
nRBC: 0 % (ref 0.0–0.2)

## 2020-01-12 LAB — COMPREHENSIVE METABOLIC PANEL
ALT: 30 U/L (ref 0–44)
AST: 22 U/L (ref 15–41)
Albumin: 2.2 g/dL — ABNORMAL LOW (ref 3.5–5.0)
Alkaline Phosphatase: 132 U/L — ABNORMAL HIGH (ref 38–126)
Anion gap: 10 (ref 5–15)
BUN: <5 mg/dL — ABNORMAL LOW (ref 6–20)
CO2: 19 mmol/L — ABNORMAL LOW (ref 22–32)
Calcium: 8.9 mg/dL (ref 8.9–10.3)
Chloride: 108 mmol/L (ref 98–111)
Creatinine, Ser: 0.47 mg/dL (ref 0.44–1.00)
GFR calc Af Amer: >60 mL/min (ref >60)
GFR calc non Af Amer: >60 mL/min (ref >60)
Glucose, Bld: 118 mg/dL — ABNORMAL HIGH (ref 70–99)
Potassium: 3.9 mmol/L (ref 3.5–5.1)
Sodium: 137 mmol/L (ref 135–145)
Total Bilirubin: 0.5 mg/dL (ref 0.3–1.2)
Total Protein: 5.3 g/dL — ABNORMAL LOW (ref 6.5–8.1)

## 2020-01-12 LAB — CULTURE, BLOOD (ROUTINE X 2)
Culture: NO GROWTH
Culture: NO GROWTH
Special Requests: ADEQUATE

## 2020-01-12 LAB — MAGNESIUM: Magnesium: 2.2 mg/dL (ref 1.7–2.4)

## 2020-01-12 LAB — PHOSPHORUS: Phosphorus: 3 mg/dL (ref 2.5–4.6)

## 2020-01-12 NOTE — Progress Notes (Signed)
PHARMACY CONSULT NOTE FOR:  OUTPATIENT  PARENTERAL ANTIBIOTIC THERAPY (OPAT)  Indication: Liver abscesses Regimen: Ceftriaxone 2 gm every 24 hours + Metronidazole 500 mg PO Q 8 hours End date: 02/01/20   IV antibiotic discharge orders are pended. To discharging provider:  please sign these orders via discharge navigator,  Select New Orders & click on the button choice - Manage This Unsigned Work.     Thank you for allowing pharmacy to be a part of this patient's care.  Sharin Mons, PharmD, BCPS, BCIDP Infectious Diseases Clinical Pharmacist Phone: [phone] 01/12/2020, 1:11 PM

## 2020-01-12 NOTE — Consult Note (Signed)
[facility]    Date of Admission:  01/06/2020           Day 7 ceftriaxone        Day 7 metronidazole       Reason for Consult: Liver abscesses    Referring Provider: Dr. Dia Crawford  Assessment: She has multiple small liver abscesses.  I suspect Gram stain and cultures are negative because she was already on effective therapy.  She is amenable to PICC placement.  I plan on continuing her current antibiotic regimen and arranging follow-up in my clinic.  She will need a repeat scan in 2 to 3 weeks to determine optimal duration of antibiotic therapy.  Plan: 1. PICC placement 2. Continue ceftriaxone and metronidazole  Diagnosis: Liver abscesses  Culture Result: Culture negative  No Known Allergies  OPAT Orders Discharge antibiotics to be given via PICC line Discharge antibiotics: Per pharmacy protocol ceftriaxone  Duration: 3 weeks End Date: 02/01/2020  [facility] Care Per Protocol:  Home health RN for IV administration and teaching; PICC line care and labs.    Labs weekly while on IV antibiotics: _x_ CBC with differential _x_ BMP __ CMP __ CRP __ ESR __ Vancomycin trough __ CK  __ Please pull PIC at completion of IV antibiotics _x_ Please leave PIC in place until doctor has seen patient or been notified  Fax weekly labs to [phone]  Clinic Follow Up Appt: 01/24/2020  Principal Problem:   Hepatic abscess Active Problems:   Chronic pancreatitis ([place])   Tobacco abuse   Hyperlipidemia   Gastroesophageal reflux disease   Depression   Scheduled Meds: . buPROPion  150 mg Oral BID  . heparin  5,000 Units Subcutaneous Q8H  . metroNIDAZOLE  500 mg Oral Q8H  . polyethylene glycol  17 g Oral Daily   Continuous Infusions: . cefTRIAXone (ROCEPHIN)  IV 2 g (01/11/20 2148)   PRN Meds:.morphine injection, ondansetron **OR** ondansetron (ZOFRAN) IV, oxyCODONE-acetaminophen  HPI: Anita Poole is a 54 y.o. female with a history  of chronic, idiopathic pancreatitis who had sudden onset of fever, chills and sweats 2 weeks ago.  The fevers seem to abate but then came back and were associated with right upper quadrant abdominal pain that radiated to the right side of her neck.  She was admitted 1 week ago had multiple small cystic lesions were noted in the liver.  2 of the lesions were aspirated on 01/09/2020 yielding pus.  Interestingly no white blood cells were seen on Gram stain.  No organisms were seen.  Abscess cultures are negative at 3 days.  Blood cultures are also negative.  She is feeling much better.   Review of Systems: Review of Systems  Constitutional: Positive for chills, diaphoresis, fever and malaise/fatigue. Negative for weight loss.  HENT: Negative for congestion and sore throat.   Respiratory: Negative for cough and shortness of breath.   Cardiovascular: Negative for chest pain.  Gastrointestinal: Positive for abdominal pain, constipation and nausea. Negative for diarrhea and vomiting.  Genitourinary: Negative for dysuria.  Skin: Negative for rash.    Past Medical History:  Diagnosis Date  . Chronic pancreatitis ([place])   . Depression   . GERD (gastroesophageal reflux disease)   . Medical history non-contributory     Social History   Tobacco Use  . Smoking status: Current Some Day Smoker    Packs/day: 0.30    Years: 30.00    Pack years:  9.00    Types: Cigarettes  . Smokeless tobacco: Never Used  Substance Use Topics  . Alcohol use: No  . Drug use: No    Family History  Problem Relation Age of Onset  . Diabetes Mother   . Kidney failure Mother   . Hypertension Mother   . Coronary artery disease Mother   . Heart disease Mother   . Lung cancer Father    No Known Allergies  OBJECTIVE: Blood pressure 109/68, pulse 71, temperature 99 F (37.2 C), temperature source Oral, resp. rate 18, height '5\' 4"'$  (1.626 m), weight 73.5 kg, SpO2 97 %.  Physical Exam Constitutional:      Comments:  She is resting quietly in bed.  She is in good spirits.  Cardiovascular:     Rate and Rhythm: Normal rate and regular rhythm.     Heart sounds: No murmur heard.   Pulmonary:     Effort: Pulmonary effort is normal.     Breath sounds: Normal breath sounds.  Abdominal:     Palpations: Abdomen is soft. There is no mass.     Tenderness: There is abdominal tenderness.     Comments: Moderate right upper quadrant tenderness with palpation.  Musculoskeletal:        General: No swelling or tenderness.  Skin:    Findings: No rash.  Neurological:     General: No focal deficit present.  Psychiatric:        Mood and Affect: Mood normal.     Lab Results Lab Results  Component Value Date   WBC 9.0 01/12/2020   HGB 10.7 (L) 01/12/2020   HCT 33.2 (L) 01/12/2020   MCV 93.8 01/12/2020   PLT 286 01/12/2020    Lab Results  Component Value Date   CREATININE 0.47 01/12/2020   BUN <5 (L) 01/12/2020   NA 137 01/12/2020   K 3.9 01/12/2020   CL 108 01/12/2020   CO2 19 (L) 01/12/2020    Lab Results  Component Value Date   ALT 30 01/12/2020   AST 22 01/12/2020   ALKPHOS 132 (H) 01/12/2020   BILITOT 0.5 01/12/2020     Microbiology: Recent Results (from the past 240 hour(s))  SARS CORONAVIRUS 2 (TAT 6-24 HRS) Nasopharyngeal Nasopharyngeal Swab     Status: None   Collection Time: 01/06/20 11:22 AM   Specimen: Nasopharyngeal Swab  Result Value Ref Range Status   SARS Coronavirus 2 NEGATIVE NEGATIVE Final    Comment: (NOTE) SARS-CoV-2 target nucleic acids are NOT DETECTED.  The SARS-CoV-2 RNA is generally detectable in upper and lower respiratory specimens during the acute phase of infection. Negative results do not preclude SARS-CoV-2 infection, do not rule out co-infections with other pathogens, and should not be used as the sole basis for treatment or other patient management decisions. Negative results must be combined with clinical observations, patient history, and epidemiological  information. The expected result is Negative.  Fact Sheet for Patients: SugarRoll.be  Fact Sheet for Healthcare Providers: https://www.woods-mathews.com/  This test is not yet approved or cleared by the [place] FDA and  has been authorized for detection and/or diagnosis of SARS-CoV-2 by FDA under an Emergency Use Authorization (EUA). This EUA will remain  in effect (meaning this test can be used) for the duration of the COVID-19 declaration under Se ction 564(b)(1) of the Act, 21 U.S.C. section 360bbb-3(b)(1), unless the authorization is terminated or revoked sooner.  Performed at [facility], [place] [address] [place], [place] [address]  SARS Coronavirus 2 by RT PCR (hospital order, performed in [facility] hospital lab) Nasopharyngeal Nasopharyngeal Swab     Status: None   Collection Time: 01/06/20 10:14 PM   Specimen: Nasopharyngeal Swab  Result Value Ref Range Status   SARS Coronavirus 2 NEGATIVE NEGATIVE Final    Comment: (NOTE) SARS-CoV-2 target nucleic acids are NOT DETECTED.  The SARS-CoV-2 RNA is generally detectable in upper and lower respiratory specimens during the acute phase of infection. The lowest concentration of SARS-CoV-2 viral copies this assay can detect is 250 copies / mL. A negative result does not preclude SARS-CoV-2 infection and should not be used as the sole basis for treatment or other patient management decisions.  A negative result may occur with improper specimen collection / handling, submission of specimen other than nasopharyngeal swab, presence of viral mutation(s) within the areas targeted by this assay, and inadequate number of viral copies (<250 copies / mL). A negative result must be combined with clinical observations, patient history, and epidemiological information.  Fact Sheet for Patients:   StrictlyIdeas.no  Fact Sheet for Healthcare  Providers: BankingDealers.co.za  This test is not yet approved or  cleared by the [place] FDA and has been authorized for detection and/or diagnosis of SARS-CoV-2 by FDA under an Emergency Use Authorization (EUA).  This EUA will remain in effect (meaning this test can be used) for the duration of the COVID-19 declaration under Section 564(b)(1) of the Act, 21 U.S.C. section 360bbb-3(b)(1), unless the authorization is terminated or revoked sooner.  Performed at [facility], [place] [address], [place], [place] [address]   Culture, blood (routine x 2)     Status: None   Collection Time: 01/07/20 12:08 AM   Specimen: BLOOD LEFT HAND  Result Value Ref Range Status   Specimen Description BLOOD LEFT HAND  Final   Special Requests   Final    BOTTLES DRAWN AEROBIC AND ANAEROBIC Blood Culture results may not be optimal due to an excessive volume of blood received in culture bottles   Culture   Final    NO GROWTH 5 DAYS Performed at [facility], [place] [address] [place], [place] [address]    Report Status 01/12/2020 FINAL  Final  Culture, blood (routine x 2)     Status: None   Collection Time: 01/07/20 12:10 AM   Specimen: BLOOD RIGHT HAND  Result Value Ref Range Status   Specimen Description BLOOD RIGHT HAND  Final   Special Requests   Final    BOTTLES DRAWN AEROBIC AND ANAEROBIC Blood Culture adequate volume   Culture   Final    NO GROWTH 5 DAYS Performed at [facility], [place] [address], [place], [place] [address]    Report Status 01/12/2020 FINAL  Final  Culture, Urine     Status: None   Collection Time: 01/07/20  1:28 AM   Specimen: Urine, Random  Result Value Ref Range Status   Specimen Description URINE, RANDOM  Final   Special Requests NONE  Final   Culture   Final    NO GROWTH Performed at [facility], [place] [address] [place],  [address]    Report Status 01/09/2020 FINAL  Final  Aerobic/Anaerobic Culture  (surgical/deep wound)     Status: None (Preliminary result)   Collection Time: 01/09/20 11:30 AM   Specimen: Abscess  Result Value Ref Range Status   Specimen Description ABSCESS  Final   Special Requests LIVER  Final  Gram Stain NO WBC SEEN NO ORGANISMS SEEN   Final   Culture   Final    NO GROWTH 3 DAYS Performed at [facility], [place] [address], [place], [place] [address]    Report Status PENDING  Incomplete    Michel Bickers, MD [facility] [place] [phone] pager   [phone] cell 01/12/2020, 12:20 PM

## 2020-01-12 NOTE — Plan of Care (Signed)
  Problem: Pain Managment: Goal: General experience of comfort will improve Outcome: Progressing   

## 2020-01-12 NOTE — Progress Notes (Signed)
PROGRESS NOTE    Anita Poole  UEA:[id number] DOB: May 28, 1966 DOA: 01/06/2020 PCP: Doreene Nestlark, Katherine K, NP     Brief Narrative:  54 y.o. WF PMHx Calcific chronic pancreatitis of unknown etiology, GERD, depression who presents to the ED for evaluation of fevers and right upper quadrant pain.  Patient states she has been in her usual state of health until last week when she began to spike fevers for 3 consecutive days.  The following 3 days she had no fevers and then began to have fevers up to 102 Fahrenheit again on 01/06/2020.  She has had chills, diaphoresis, and some associated nausea without vomiting.  She was having right shoulder pain and went to urgent care for further evaluation.  On exam she had right upper quadrant pain and due to concern for gallbladder disease she was sent to the ED for further evaluation.  Patient reports a history of chronic pancreatitis which she feels has been well controlled and does not feel her current symptoms are similar to her prior pancreatitis symptoms.  She denies any chest pain, dyspnea, cough, dysuria, diarrhea.  She denies any recent medication changes.  She reports smoking about 2-3 cigarettes/day.  She denies any alcohol or illicit drug use.  RUQ ultrasound shows gallbladder sludge without evidence of gallstones. Dilated CBD measuring 13.8 mm is seen. No obstructing stones or masses are identified. Right hepatic lobe hypoechoic structures are seen suggestive of hepatic cysts.  Patient was given 1 L normal saline, Zofran, morphine. EDP discussed the case with on-call general surgery who will see in consultation. Patient was given IV ceftriaxone and Flagyl and the hospitalist service was consulted to admit for further evaluation and management.  Subjective: 6/24 afebrile last 24 hours negative S OB, negative CP, positive abdominal pain with palpation, negative N/V    Assessment & Plan: Covid vaccination; no vaccination   Principal Problem:    Hepatic abscess Active Problems:   Chronic pancreatitis (HCC)   Depression   Multiple liver abscesses: -Continue IV Rocephin and Flagyl MRCP showed multiple abscesses. -Continue narcotics for pain control.  Appreciate GIs assistance, we have consulted interventional radiology to perform aspiration to see if we could identify the organism. -S/p IR will plan for image guided aspiration/biopsy on 01/09/2020 culture results are still pending. -6/24 consulted ID patient to be discharged home on her current antibiotic regimen.  Patient will follow up with Dr. Cliffton AstersJohn Campbell, ID in ~3 weeks.  Will require repeat scan.   -Awaiting PICC line placement  -6/25 will ensure with NCM that ID RN has arranged for home antibiotics.  Chronic pancreatitis (HCC) -Pain control continue IV fluids and narcotics. -Stable  Depression -Wellbutrin 150 mg  BID.  Hypokalemia -Resolved  Hypophosphatemia -Resolved  Hypomagnesmia -Resolved .    DVT prophylaxis: Heparin subcu Code Status: Full Family Communication:  Status is: Inpatient    Dispo: The patient is from: Home              Anticipated d/c is to:               Anticipated d/c date is: 6/26              Patient currently unstable continues to spike fevers no plan for home antibiotic coverage      Consultants:    Procedures/Significant Events:    I have personally reviewed and interpreted all radiology studies and my findings are as above.  VENTILATOR SETTINGS:    Cultures   Antimicrobials: Anti-infectives (From  admission, onward)   Start     Dose/Rate Stop   01/09/20 1600  metroNIDAZOLE (FLAGYL) tablet 500 mg     Discontinue     500 mg     01/07/20 2200  cefTRIAXone (ROCEPHIN) 2 g in sodium chloride 0.9 % 100 mL IVPB     Discontinue     2 g 200 mL/hr over 30 Minutes     01/07/20 0800  metroNIDAZOLE (FLAGYL) IVPB 500 mg  Status:  Discontinued        500 mg 100 mL/hr over 60 Minutes 01/09/20 1108   01/07/20 0030   metroNIDAZOLE (FLAGYL) IVPB 500 mg        500 mg 100 mL/hr over 60 Minutes 01/07/20 0218   01/06/20 2230  cefTRIAXone (ROCEPHIN) 2 g in sodium chloride 0.9 % 100 mL IVPB        2 g 200 mL/hr over 30 Minutes 01/07/20 0110       Devices    LINES / TUBES:      Continuous Infusions: . cefTRIAXone (ROCEPHIN)  IV 2 g (01/11/20 2148)     Objective: Vitals:   01/11/20 0938 01/11/20 1809 01/11/20 2132 01/12/20 0504  BP: (!) 109/57 119/73 122/67 (!) 102/59  Pulse: 70 72 69 66  Resp: 18 18 18 18   Temp: 97.9 F (36.6 C) 98.5 F (36.9 C) 98.2 F (36.8 C) 99 F (37.2 C)  TempSrc: Oral Oral Oral Oral  SpO2: 95% 91% 95% 94%  Weight:      Height:        Intake/Output Summary (Last 24 hours) at 01/12/2020 0805 Last data filed at 01/12/2020 0600 Gross per 24 hour  Intake 1649.34 ml  Output 0 ml  Net 1649.34 ml   Filed Weights   01/08/20 2105 01/09/20 2019 01/11/20 0457  Weight: 74.1 kg 72.6 kg 73.5 kg    Examination:  General: A/O x4, no acute respiratory distress Eyes: negative scleral hemorrhage, negative anisocoria, negative icterus ENT: Negative Runny nose, negative gingival bleeding, Neck:  Negative scars, masses, torticollis, lymphadenopathy, JVD Lungs: Clear to auscultation bilaterally without wheezes or crackles Cardiovascular: Regular rate and rhythm without murmur gallop or rub normal S1 and S2 Abdomen: Positive RUQ abdominal pain, nondistended, positive soft, bowel sounds, no rebound, no ascites, no appreciable mass Extremities: No significant cyanosis, clubbing, or edema bilateral lower extremities Skin: Negative rashes, lesions, ulcers Psychiatric:  Negative depression, negative anxiety, negative fatigue, negative mania  Central nervous system:  Cranial nerves II through XII intact, tongue/uvula midline, all extremities muscle strength 5/5, sensation intact throughout, negative dysarthria, negative expressive aphasia, negative receptive aphasia.  .      Data Reviewed: Care during the described time interval was provided by me .  I have reviewed this patient's available data, including medical history, events of note, physical examination, and all test results as part of my evaluation.  CBC: Recent Labs  Lab 01/06/20 1332 01/07/20 0256 01/10/20 0551 01/11/20 1113  WBC 17.8* 14.5* 9.2 9.4  NEUTROABS  --   --   --  7.3  HGB 13.5 11.3* 11.0* 10.7*  HCT 41.2 33.9* 32.6* 32.4*  MCV 94.9 92.9 91.8 93.4  PLT 233 178 225 271   Basic Metabolic Panel: Recent Labs  Lab 01/06/20 1332 01/07/20 0256 01/10/20 0551 01/11/20 1113  NA 134* 135 137 140  K 3.9 3.6 3.1* 3.2*  CL 102 107 105 107  CO2 21* 20* 24 25  GLUCOSE 119* 121* 147* 142*  BUN 14 14  11 7  CREATININE 0.82 0.74 0.45 0.65  CALCIUM 9.6 9.1 8.9 8.6*  MG  --   --   --  1.9  PHOS  --   --   --  1.7*   GFR: Estimated Creatinine Clearance: 77.1 mL/min (by C-G formula based on SCr of 0.65 mg/dL). Liver Function Tests: Recent Labs  Lab 01/06/20 1332 01/07/20 0256 01/10/20 0551 01/11/20 1113  AST 30 32 12* 16  ALT 88* 77* 38 29  ALKPHOS 224* 184* 160* 139*  BILITOT 1.5* 1.5* 0.7 0.5  PROT 7.0 5.7* 5.3* 5.2*  ALBUMIN 3.3* 2.7* 2.3* 2.1*   Recent Labs  Lab 01/06/20 1332 01/11/20 1113  LIPASE 32 48   No results for input(s): AMMONIA in the last 168 hours. Coagulation Profile: Recent Labs  Lab 01/08/20 0457  INR 1.2   Cardiac Enzymes: No results for input(s): CKTOTAL, CKMB, CKMBINDEX, TROPONINI in the last 168 hours. BNP (last 3 results) No results for input(s): PROBNP in the last 8760 hours. HbA1C: No results for input(s): HGBA1C in the last 72 hours. CBG: No results for input(s): GLUCAP in the last 168 hours. Lipid Profile: No results for input(s): CHOL, HDL, LDLCALC, TRIG, CHOLHDL, LDLDIRECT in the last 72 hours. Thyroid Function Tests: No results for input(s): TSH, T4TOTAL, FREET4, T3FREE, THYROIDAB in the last 72 hours. Anemia Panel: No results  for input(s): VITAMINB12, FOLATE, FERRITIN, TIBC, IRON, RETICCTPCT in the last 72 hours. Sepsis Labs: Recent Labs  Lab 01/06/20 2112 01/07/20 0009  LATICACIDVEN 1.0 0.8    Recent Results (from the past 240 hour(s))  SARS CORONAVIRUS 2 (TAT 6-24 HRS) Nasopharyngeal Nasopharyngeal Swab     Status: None   Collection Time: 01/06/20 11:22 AM   Specimen: Nasopharyngeal Swab  Result Value Ref Range Status   SARS Coronavirus 2 NEGATIVE NEGATIVE Final    Comment: (NOTE) SARS-CoV-2 target nucleic acids are NOT DETECTED.  The SARS-CoV-2 RNA is generally detectable in upper and lower respiratory specimens during the acute phase of infection. Negative results do not preclude SARS-CoV-2 infection, do not rule out co-infections with other pathogens, and should not be used as the sole basis for treatment or other patient management decisions. Negative results must be combined with clinical observations, patient history, and epidemiological information. The expected result is Negative.  Fact Sheet for Patients: HairSlick.no  Fact Sheet for Healthcare Providers: quierodirigir.com  This test is not yet approved or cleared by the [place] FDA and  has been authorized for detection and/or diagnosis of SARS-CoV-2 by FDA under an Emergency Use Authorization (EUA). This EUA will remain  in effect (meaning this test can be used) for the duration of the COVID-19 declaration under Se ction 564(b)(1) of the Act, 21 U.S.C. section 360bbb-3(b)(1), unless the authorization is terminated or revoked sooner.  Performed at [facility], [address], [place], [place] [address]   SARS Coronavirus 2 by RT PCR (hospital order, performed in [facility] hospital lab) Nasopharyngeal Nasopharyngeal Swab     Status: None   Collection Time: 01/06/20 10:14 PM   Specimen: Nasopharyngeal Swab  Result Value Ref Range Status   SARS Coronavirus 2  NEGATIVE NEGATIVE Final    Comment: (NOTE) SARS-CoV-2 target nucleic acids are NOT DETECTED.  The SARS-CoV-2 RNA is generally detectable in upper and lower respiratory specimens during the acute phase of infection. The lowest concentration of SARS-CoV-2 viral copies this assay can detect is 250 copies / mL. A negative result does not preclude SARS-CoV-2 infection  and should not be used as the sole basis for treatment or other patient management decisions.  A negative result may occur with improper specimen collection / handling, submission of specimen other than nasopharyngeal swab, presence of viral mutation(s) within the areas targeted by this assay, and inadequate number of viral copies (<250 copies / mL). A negative result must be combined with clinical observations, patient history, and epidemiological information.  Fact Sheet for Patients:   StrictlyIdeas.no  Fact Sheet for Healthcare Providers: BankingDealers.co.za  This test is not yet approved or  cleared by the [place] FDA and has been authorized for detection and/or diagnosis of SARS-CoV-2 by FDA under an Emergency Use Authorization (EUA).  This EUA will remain in effect (meaning this test can be used) for the duration of the COVID-19 declaration under Section 564(b)(1) of the Act, 21 U.S.C. section 360bbb-3(b)(1), unless the authorization is terminated or revoked sooner.  Performed at [facility], [place] [address], [place], [place] [address]   Culture, blood (routine x 2)     Status: None (Preliminary result)   Collection Time: 01/07/20 12:08 AM   Specimen: BLOOD LEFT HAND  Result Value Ref Range Status   Specimen Description BLOOD LEFT HAND  Final   Special Requests   Final    BOTTLES DRAWN AEROBIC AND ANAEROBIC Blood Culture results may not be optimal due to an excessive volume of blood received in culture bottles   Culture   Final    NO GROWTH 4  DAYS Performed at [facility], [place] [address] [place], [place] [address]    Report Status PENDING  Incomplete  Culture, blood (routine x 2)     Status: None (Preliminary result)   Collection Time: 01/07/20 12:10 AM   Specimen: BLOOD RIGHT HAND  Result Value Ref Range Status   Specimen Description BLOOD RIGHT HAND  Final   Special Requests   Final    BOTTLES DRAWN AEROBIC AND ANAEROBIC Blood Culture adequate volume   Culture   Final    NO GROWTH 4 DAYS Performed at [facility], [place] [address] [place], [place] [address]    Report Status PENDING  Incomplete  Culture, Urine     Status: None   Collection Time: 01/07/20  1:28 AM   Specimen: Urine, Random  Result Value Ref Range Status   Specimen Description URINE, RANDOM  Final   Special Requests NONE  Final   Culture   Final    NO GROWTH Performed at [facility], [place] [address], [place], [place] [address]    Report Status 01/09/2020 FINAL  Final  Aerobic/Anaerobic Culture (surgical/deep wound)     Status: None (Preliminary result)   Collection Time: 01/09/20 11:30 AM   Specimen: Abscess  Result Value Ref Range Status   Specimen Description ABSCESS  Final   Special Requests LIVER  Final   Gram Stain NO WBC SEEN NO ORGANISMS SEEN   Final   Culture   Final    NO GROWTH 2 DAYS Performed at [facility], [address], [place], [place] [address]    Report Status PENDING  Incomplete         Radiology Studies: No results found.      Scheduled Meds: . buPROPion  150 mg Oral BID  . heparin  5,000 Units Subcutaneous Q8H  . metroNIDAZOLE  500 mg Oral Q8H  . polyethylene glycol  17 g Oral Daily   Continuous Infusions: .  cefTRIAXone (ROCEPHIN)  IV 2 g (01/11/20 2148)     LOS: 5 days    Time spent:40 min    Dea Bitting, Roselind Messier, MD [facility] Pager [phone]  If 7PM-7AM, please contact night-coverage [email or web address] Password [facility] 01/12/2020, 8:05 AM

## 2020-01-12 NOTE — TOC Initial Note (Signed)
Transition of Care [facility]) - Initial/Assessment Note    Patient Details  Name: Anita Poole MRN: [id number] Date of Birth: [birth year]  [facility] CM/SW Contact:    Bartholomew Crews, RN Phone Number: [phone] 01/12/2020, 3:41 PM  Clinical Narrative:                  Spoke with patient at the bedside. PTA home with son. Verified PCP and preferred pharmacy in [place] as correct.   Patient verbalized understanding of need for PICC line placement for IV antibiotics at home. OPAT order placed. Patient will need order for PICC placement. [facility] has met with patient and will do teaching prior to discharge.   Patient reports no difficulty with transportation for medical appointments or transportation home at time of discharge.   TOC following for transition needs.   Expected Discharge Plan: [place] Barriers to Discharge: Continued Medical Work up   Patient Goals and CMS Choice Patient states their goals for this hospitalization and ongoing recovery are:: return home CMS Medicare.gov Compare Post Acute Care list provided to:: Patient Choice offered to / list presented to : Patient  Expected Discharge Plan and Services Expected Discharge Plan: [place]   Discharge Planning Services: CM Consult Post Acute Care Choice: [place] arrangements for the past 2 months: Mobile Home                 DME Arranged: N/A DME Agency: NA       HH Arranged: RN [place] Agency: [facility] Date HH Agency Contacted: 01/12/20 Time HH Agency Contacted: 1500 Representative spoke with at [place]: [place] Arrangements/Services Living arrangements for the past 2 months: Mobile Home Lives with:: Self, Adult Children Patient language and need for interpreter reviewed:: Yes        Need for Family Participation in Patient Care: No (Comment)     Criminal Activity/Legal Involvement Pertinent to Current Situation/Hospitalization: No - Comment  as needed  Activities of Daily Living Home Assistive Devices/Equipment: None ADL Screening (condition at time of admission) Patient's cognitive ability adequate to safely complete daily activities?: Yes Is the patient deaf or have difficulty hearing?: No Does the patient have difficulty seeing, even when wearing glasses/contacts?: No Does the patient have difficulty concentrating, remembering, or making decisions?: No Patient able to express need for assistance with ADLs?: Yes Does the patient have difficulty dressing or bathing?: No Independently performs ADLs?: Yes (appropriate for developmental age) Does the patient have difficulty walking or climbing stairs?: No Weakness of Legs: None Weakness of Arms/Hands: None  Permission Sought/Granted                  Emotional Assessment Appearance:: Appears stated age Attitude/Demeanor/Rapport: Engaged Affect (typically observed): Accepting Orientation: : Oriented to Self, Oriented to  Time, Oriented to Place, Oriented to Situation Alcohol / Substance Use: Not Applicable Psych Involvement: No (comment)  Admission diagnosis:  Cholangitis [K83.09] Acute cholangitis [K83.09] Abdominal pain [R10.9] Patient Active Problem List   Diagnosis Date Noted  . Hepatic abscess 01/07/2020  . Depression 01/07/2020  . Fever 01/06/2020  . Chronic constipation 07/29/2019  . Gastroesophageal reflux disease 06/22/2018  . Hyperlipidemia 10/24/2016  . Preventative health care 10/24/2016  . Moderate episode of recurrent major depressive disorder ([place]) 09/12/2016  . Tobacco abuse 09/12/2016  . Chronic pancreatitis ([place]) 01/26/2016  . Prediabetes 01/26/2016  . Choledocholithiasis 01/26/2016   PCP:  [name], NP Pharmacy:  [id number] [place] [address]Phone: [phone]: [phone]    Social Determinants of Health (SDOH) Interventions    Readmission  Risk Interventions No flowsheet data found.

## 2020-01-13 DIAGNOSIS — E7800 Pure hypercholesterolemia, unspecified: Secondary | ICD-10-CM

## 2020-01-13 DIAGNOSIS — R1084 Generalized abdominal pain: Secondary | ICD-10-CM

## 2020-01-13 LAB — CBC WITH DIFFERENTIAL/PLATELET
Abs Immature Granulocytes: 0.07 10*3/uL (ref 0.00–0.07)
Basophils Absolute: 0 10*3/uL (ref 0.0–0.1)
Basophils Relative: 0 %
Eosinophils Absolute: 0 10*3/uL (ref 0.0–0.5)
Eosinophils Relative: 1 %
HCT: 34.5 % — ABNORMAL LOW (ref 36.0–46.0)
Hemoglobin: 11.3 g/dL — ABNORMAL LOW (ref 12.0–15.0)
Immature Granulocytes: 1 %
Lymphocytes Relative: 16 %
Lymphs Abs: 1.3 10*3/uL (ref 0.7–4.0)
MCH: 30.8 pg (ref 26.0–34.0)
MCHC: 32.8 g/dL (ref 30.0–36.0)
MCV: 94 fL (ref 80.0–100.0)
Monocytes Absolute: 0.4 10*3/uL (ref 0.1–1.0)
Monocytes Relative: 5 %
Neutro Abs: 6.5 10*3/uL (ref 1.7–7.7)
Neutrophils Relative %: 77 %
Platelets: 338 10*3/uL (ref 150–400)
RBC: 3.67 MIL/uL — ABNORMAL LOW (ref 3.87–5.11)
RDW: 12.6 % (ref 11.5–15.5)
WBC: 8.4 10*3/uL (ref 4.0–10.5)
nRBC: 0 % (ref 0.0–0.2)

## 2020-01-13 LAB — PHOSPHORUS: Phosphorus: 3.1 mg/dL (ref 2.5–4.6)

## 2020-01-13 LAB — COMPREHENSIVE METABOLIC PANEL
ALT: 29 U/L (ref 0–44)
AST: 19 U/L (ref 15–41)
Albumin: 2.4 g/dL — ABNORMAL LOW (ref 3.5–5.0)
Alkaline Phosphatase: 137 U/L — ABNORMAL HIGH (ref 38–126)
Anion gap: 11 (ref 5–15)
BUN: 8 mg/dL (ref 6–20)
CO2: 23 mmol/L (ref 22–32)
Calcium: 9.2 mg/dL (ref 8.9–10.3)
Chloride: 104 mmol/L (ref 98–111)
Creatinine, Ser: 0.54 mg/dL (ref 0.44–1.00)
GFR calc Af Amer: >60 mL/min (ref >60)
GFR calc non Af Amer: >60 mL/min (ref >60)
Glucose, Bld: 100 mg/dL — ABNORMAL HIGH (ref 70–99)
Potassium: 3.7 mmol/L (ref 3.5–5.1)
Sodium: 138 mmol/L (ref 135–145)
Total Bilirubin: 0.4 mg/dL (ref 0.3–1.2)
Total Protein: 5.8 g/dL — ABNORMAL LOW (ref 6.5–8.1)

## 2020-01-13 LAB — MAGNESIUM: Magnesium: 2.3 mg/dL (ref 1.7–2.4)

## 2020-01-13 LAB — CANCER ANTIGEN 19-9: CA 19-9: 5 U/mL (ref 0–35)

## 2020-01-13 MED ORDER — CEFTRIAXONE IV (FOR PTA / DISCHARGE USE ONLY): 2.0000 g | INTRAVENOUS | 0 refills | Status: AC

## 2020-01-13 MED ORDER — SODIUM CHLORIDE 0.9% FLUSH: 10.0000 mL | INTRAVENOUS | Status: DC | PRN

## 2020-01-13 MED ORDER — METRONIDAZOLE 500 MG PO TABS
500.0000 mg | ORAL_TABLET | Freq: Three times a day (TID) | ORAL | 0 refills | Status: AC
Start: 2020-01-13 — End: 2020-02-02

## 2020-01-13 MED ORDER — SODIUM CHLORIDE 0.9 % IV SOLN: 2.0000 g | INTRAVENOUS | 0 refills | Status: AC

## 2020-01-13 MED ORDER — METRONIDAZOLE 500 MG PO TABS: 500.0000 mg | ORAL_TABLET | Freq: Three times a day (TID) | ORAL | 0 refills | Status: DC

## 2020-01-13 MED ORDER — SODIUM CHLORIDE 0.9% FLUSH
10.0000 mL | Freq: Two times a day (BID) | INTRAVENOUS | Status: DC
  Administered 2020-01-13: 20 mL

## 2020-01-13 MED ORDER — CHLORHEXIDINE GLUCONATE CLOTH 2 % EX PADS
6.0000 | MEDICATED_PAD | Freq: Every day | CUTANEOUS | Status: DC
  Administered 2020-01-13: 6 via TOPICAL

## 2020-01-13 NOTE — Discharge Summary (Signed)
Physician Discharge Summary  Minie Roadcap ELF:[id number] DOB: 08-Jan-1966 DOA: 01/06/2020  PCP: [name], NP  Admit date: 01/06/2020 Discharge date: 01/13/2020  Time spent: 30 minutes  Recommendations for Outpatient Follow-up:   Multiple liver abscesses: -Continue IV Rocephin and Flagyl MRCP showed multiple abscesses. -Continue narcotics for pain control. Appreciate GIs assistance, we have consulted interventional radiology to perform aspiration to see if we could identify the organism. -S/p IR will plan for image guided aspiration/biopsy on 6/21/2021culture results are still pending. -6/24 consulted ID patient to be discharged home on her current antibiotic regimen.  Patient will follow up with Dr. Michel Bickers, ID in ~3 weeks.  Will require repeat scan.   -Awaiting PICC line placement  -6/25 will ensure with NCM that ID RN has arranged for home antibiotics.  Chronic pancreatitis (HCC) -Pain control per PCP -Stable  Depression -Wellbutrin 150 mg  BID.  Hypokalemia -Resolved  Hypophosphatemia -Resolved  Hypomagnesmia -Resolved   Discharge Diagnoses:  Principal Problem:   Hepatic abscess Active Problems:   Chronic pancreatitis ([place])   Tobacco abuse   Hyperlipidemia   Gastroesophageal reflux disease   Depression   Discharge Condition: Stable  Diet recommendation: Regular  Filed Weights   01/08/20 2105 01/09/20 2019 01/11/20 0457  Weight: 74.1 kg 72.6 kg 73.5 kg    History of present illness:  54 y.o.WF PMHx Calcific chronic pancreatitis of unknown etiology, GERD, depression who presents to the ED for evaluation of fevers and right upper quadrant pain.  Patient states she has been in her usual state of health until last week when she began to spike fevers for 3 consecutive days. The following 3 days she had no fevers and then began to have fevers up to 102 Fahrenheit again on 01/06/2020. She has had chills, diaphoresis, and some associated  nausea without vomiting. She was having right shoulder pain and went to urgent care for further evaluation. On exam she had right upper quadrant pain and due to concern for gallbladder disease she was sent to the ED for further evaluation.  Patient reports a history of chronic pancreatitis which she feels has been well controlled and does not feel her current symptoms are similar to her prior pancreatitis symptoms. She denies any chest pain, dyspnea, cough, dysuria, diarrhea. She denies any recent medication changes. She reports smoking about 2-3 cigarettes/day. She denies any alcohol or illicit drug use.  RUQ ultrasound shows gallbladder sludge without evidence of gallstones. Dilated CBD measuring 13.8 mm is seen. No obstructing stones or masses are identified. Right hepatic lobe hypoechoic structures are seen suggestive of hepatic cysts.  Patient was given 1 L normal saline, Zofran, morphine. EDP discussed the case with on-call general surgery who willsee in consultation. Patient was given IV ceftriaxone and Flagyl and the hospitalist service was consultedtoadmit for further evaluation and management.  Hospital Course:  See above  Procedures: 6/18 US abdomen Limited RUQ;-gallbladder sludge without evidence of gallstones. -Dilated common bile duct with intrahepatic biliary dilatation. MRI correlation is recommended, as no obstructing stones or obstructing masses are identified on the current study. -Findings likely consistent with hepatic cysts 6/19 MR abdomen MRCP W/W0 contrast;-multiple round enhancing lesions within the liver which have restricted diffusion. Differential includes multifocal hepatic metastasis versus multifocal hepatic abscesses. - Dilatation of the pancreatic duct and common bile duct is a common sign of pancreatic adenocarcinoma; however, the pancreatic ductal dilatation is similar to 2017. Patient has history of chronic pancreatitis with calcifications within the  distal pancreatic duct.Favor findings  acute chronic pancreatitis; however, recommend correlation with tumor markers (CA 19- 9). 6/21 IR US guided BX ASP/drain;-aspiration of 2 adjacent hypoechoic abnormalities in the right lobe of the liver yielded thick, purulent fluid. A total of 3 mL of fluid was removed resulting in decompression by ultrasound. Fluid was sent for both culture analysis and cytologic analysis.    Consultations: ID Dr. Michel Bickers   Cultures  6/19 blood LEFT hand negative final 6/19 RIGHT hand negative final 6/19 urine negative 6/21 liver abscess NGTD    Antibiotics Anti-infectives (From admission, onward)   Start     Ordered Stop   01/13/20 0000  cefTRIAXone (ROCEPHIN) IVPB     Discontinue     01/13/20 1122 02/02/20 2359   01/13/20 0000  metroNIDAZOLE (FLAGYL) 500 MG tablet     Discontinue     01/13/20 1122 02/02/20 2359   01/13/20 0000  cefTRIAXone 2 g in sodium chloride 0.9 % 100 mL     Discontinue     01/13/20 1122 02/03/20 2359   01/13/20 0000  metroNIDAZOLE (FLAGYL) 500 MG tablet     Discontinue     01/13/20 1122 02/03/20 2359   01/09/20 1600  metroNIDAZOLE (FLAGYL) tablet 500 mg     Discontinue     01/09/20 1109     01/07/20 2200  cefTRIAXone (ROCEPHIN) 2 g in sodium chloride 0.9 % 100 mL IVPB     Discontinue     01/07/20 0119     01/07/20 0800  metroNIDAZOLE (FLAGYL) IVPB 500 mg  Status:  Discontinued        01/07/20 0119 01/09/20 1108   01/07/20 0030  metroNIDAZOLE (FLAGYL) IVPB 500 mg        01/07/20 0017 01/07/20 0218   01/06/20 2230  cefTRIAXone (ROCEPHIN) 2 g in sodium chloride 0.9 % 100 mL IVPB        01/06/20 2216 01/07/20 0110       Discharge Exam: Vitals:   01/12/20 1639 01/12/20 2033 01/13/20 0436 01/13/20 0822  BP: 108/64 114/72 118/69 117/60  Pulse: (!) 58 65 68 62  Resp: _0 Temp: 97.6 F (36.4 C) 99.3 F (37.4 C) 97.8 F (36.6 C) 98 F (36.7 C)  TempSrc: Oral Oral Oral Oral  SpO2: 93% 93% 93% 96%  Weight:       Height:        General: A/O x4, no acute respiratory distress Eyes: negative scleral hemorrhage, negative anisocoria, negative icterus ENT: Negative Runny nose, negative gingival bleeding, Neck:  Negative scars, masses, torticollis, lymphadenopathy, JVD Lungs: Clear to auscultation bilaterally without wheezes or crackles Cardiovascular: Regular rate and rhythm without murmur gallop or rub normal S1 and S2   Discharge Instructions  Discharge Instructions    [facility] pharmacist to adjust dose for Vancomycin, Aminoglycosides and other anti-infective therapies as requested by physician.   Complete by: As directed    [facility] to provide Cath Flo 42m   Complete by: As directed    Administer for PICC line occlusion and as ordered by physician for other access device issues.   Anaphylaxis Kit: Provided to treat any anaphylactic reaction to the medication being provided to the patient if First Dose or when requested by physician   Complete by: As directed    Epinephrine 158mml vial / amp: Administer 0.54m34m0.54ml50mubcutaneously once for moderate to severe anaphylaxis, nurse to call physician and pharmacy when reaction occurs and call 911 if needed for immediate care  Diphenhydramine '50mg'$ /ml IV vial: Administer 25-'50mg'$  IV/IM PRN for first dose reaction, rash, itching, mild reaction, nurse to call physician and pharmacy when reaction occurs   Sodium Chloride 0.9% NS 587m IV: Administer if needed for hypovolemic blood pressure drop or as ordered by physician after call to physician with anaphylactic reaction   Change dressing on IV access line weekly and PRN   Complete by: As directed    Flush IV access with Sodium Chloride 0.9% and Heparin 10 units/ml or 100 units/ml   Complete by: As directed    Home infusion instructions - [facility]   Complete by: As directed    Instructions: Flush IV access with Sodium Chloride 0.9% and Heparin 10units/ml or  100units/ml   Change dressing on IV access line: Weekly and PRN   Instructions Cath Flo '2mg'$ : Administer for PICC Line occlusion and as ordered by physician for other access device   [facility] pharmacist to adjust dose for: Vancomycin, Aminoglycosides and other anti-infective therapies as requested by physician   Method of administration may be changed at the discretion of home infusion pharmacist based upon assessment of the patient and/or caregiver's ability to self-administer the medication ordered   Complete by: As directed      Allergies as of 01/13/2020   No Known Allergies     Medication List    TAKE these medications   acetaminophen 500 MG tablet Commonly known as: TYLENOL Take 1,000-1,500 mg by mouth daily as needed for mild pain.   buPROPion 150 MG 12 hr tablet Commonly known as: WELLBUTRIN SR TAKE 1 TABLET (150 MG TOTAL) BY MOUTH 2 (TWO) TIMES DAILY. FOR DEPRESSION.   cefTRIAXone  IVPB Commonly known as: ROCEPHIN Inject 2 g into the vein daily for 20 days. Indication:  Liver abscesses First Dose: Yes Last Day of Therapy:  02/01/20 Labs - Once weekly:  CBC/D and BMP, Labs - Every other week:  ESR and CRP Method of administration: IV Push Method of administration may be changed at the discretion of home infusion pharmacist based upon assessment of the patient and/or caregiver's ability to self-administer the medication ordered.   cefTRIAXone 2 g in sodium chloride 0.9 % 100 mL Inject 2 g into the vein daily for 21 days.   Centrum Silver Adult 50+ Tabs Take 1 tablet by mouth daily.   Creon 24000-76000 units Cpep Generic drug: Pancrelipase (Lip-Prot-Amyl) TAKE 2 CAPSULE WITH MEALS ( 1 AFTER BEGINNING,1 WHEN FINISHED EATING). TAKE 1 CAPSULE WITH SNACK (IMMEDIATELY AFTER BEGINNING) TWICE DAILY. What changed:   how much to take  how to take this  when to take this  additional instructions   famotidine 20 MG tablet Commonly known as: PEPCID TAKE 1  TABLET BY MOUTH ONCE TO TWICE DAILY FOR HEARTBURN. What changed: See the new instructions.   gabapentin 300 MG capsule Commonly known as: NEURONTIN Take 2 capsules (600 mg total) by mouth 2 (two) times daily. As needed for pain. What changed:   when to take this  reasons to take this  additional instructions   metroNIDAZOLE 500 MG tablet Commonly known as: FLAGYL Take 1 tablet (500 mg total) by mouth 3 (three) times daily for 20 days. Please provide sufficient quantity to treat through 02/01/20   metroNIDAZOLE 500 MG tablet Commonly known as: FLAGYL Take 1 tablet (500 mg total) by mouth every 8 (eight) hours for 21 days.   ondansetron 4 MG tablet Commonly known as: ZOFRAN TAKE 1 TABLET BY MOUTH EVERY 6  HOURS AS NEEDED FOR NAUSEA AND VOMITING. QTY/DAY SUPPLY PER INSURANCE What changed:   how much to take  how to take this  when to take this  reasons to take this  additional instructions            Discharge Care Instructions  (From admission, onward)         Start     Ordered   01/13/20 0000  Change dressing on IV access line weekly and PRN  (Home infusion instructions - [facility] )        01/13/20 1122         No Known Allergies    The results of significant diagnostics from this hospitalization (including imaging, microbiology, ancillary and laboratory) are listed below for reference.    Significant Diagnostic Studies: IR US Guide Bx Asp/Drain  Result Date: 01/09/2020 INDICATION: Multiple small cystic appearing liver lesions suspicious for multifocal abscesses by MRI. EXAM: IR ULTRASOUND GUIDANCE MEDICATIONS: The patient is currently admitted to the hospital and receiving intravenous antibiotics. The antibiotics were administered within an appropriate time frame prior to the initiation of the procedure. ANESTHESIA/SEDATION: Fentanyl 75 mcg IV; Versed 1.5 mg IV Moderate Sedation Time:  13 minutes. The patient was continuously monitored during  the procedure by the interventional radiology nurse under my direct supervision. COMPLICATIONS: None immediate. PROCEDURE: Informed written consent was obtained from the patient after a thorough discussion of the procedural risks, benefits and alternatives. All questions were addressed. Maximal Sterile Barrier Technique was utilized including caps, mask, sterile gowns, sterile gloves, sterile drape, hand hygiene and skin antiseptic. A timeout was performed prior to the initiation of the procedure. Ultrasound was performed of the liver. Under direct ultrasound guidance, an 18 gauge trocar needle was advanced into the right lobe at the level of 2 adjacent cystic abnormalities. Aspiration was performed and material split for both culture analysis and cytologic analysis. FINDINGS: Multiple irregular hypoechoic abnormalities are seen scattered throughout the liver parenchyma. Two adjacent abnormalities were targeted in the right lobe measuring approximately 1.0 and 1.5 cm in respective maximal diameter. Both yielded thick, purulent fluid with a total of approximately 3 mL of total fluid volume removed. This resulted in decompression by ultrasound of the cystic component of the abnormalities. IMPRESSION: Aspiration of 2 adjacent hypoechoic abnormalities in the right lobe of the liver yielded thick, purulent fluid. A total of 3 mL of fluid was removed resulting in decompression by ultrasound. Aspirated fluid was sent for both culture analysis and cytologic analysis. Electronically Signed   By: Aletta Edouard M.D.   On: 01/09/2020 14:26   MR ABDOMEN MRCP W WO CONTAST  Result Date: 01/07/2020 CLINICAL DATA:  Fever, elevated white blood cell count. Mildly elevated bilirubin. EXAM: MRI ABDOMEN WITHOUT AND WITH CONTRAST (INCLUDING MRCP) TECHNIQUE: Multiplanar multisequence MR imaging of the abdomen was performed both before and after the administration of intravenous contrast. Heavily T2-weighted images of the biliary and  pancreatic ducts were obtained, and three-dimensional MRCP images were rendered by post processing. CONTRAST:  7.29m GADAVIST GADOBUTROL 1 MMOL/ML IV SOLN COMPARISON:  CT 01/27/2016, ultrasound 01/06/2020 FINDINGS: Lower chest:  Lung bases are clear. Hepatobiliary: Number multiple lesions within the liver which show peripheral enhancement on postcontrast T1 weighted imaging (series 21. Approximately 20 lesions in the liver. Lesions range in size from 13 mm (image 16/21) up to 30 mm (image 13/21. Lesions are hyperintense on T2 weighted imaging and showed restricted diffusion (series 9 series 8. Lesions are hyperintense on  T2 weighted imaging series 3 additionally. There is some sludge in the gallbladder. Common bile duct is dilated up to 10 mm. Pancreas: There is chronic dilatation of the common pancreatic duct to 6 mm. There is a double duct sign (dilatation of the common bile duct and pancreatic duct however this is similar to CT 2017 or the pancreatic duct measured 10 mm. There is coarse calcification in the pancreatic head and within the duct. Calcification of the duct seen on image 21/3 there is atrophy of the tail. Spleen: Normal spleen. Adrenals/urinary tract: Adrenal glands normal. Nonenhancing cysts of the kidneys. Stomach/Bowel: Stomach and limited of the small bowel is unremarkable Vascular/Lymphatic: Abdominal aortic normal caliber. No retroperitoneal periportal lymphadenopathy. Musculoskeletal: No aggressive osseous lesion IMPRESSION: 1. Multiple round enhancing lesions within the liver which have restricted diffusion. Differential includes multifocal hepatic metastasis versus multifocal hepatic abscesses. Recommend clinical evaluation for sepsis versus metastatic disease including breast, colon cancer and pancreatic cancer. 2. Dilatation of the pancreatic duct and common bile duct is a common sign of pancreatic adenocarcinoma; however, the pancreatic ductal dilatation is similar to 2017. Patient has  history of chronic pancreatitis with calcifications within the distal pancreatic duct. Favor findings acute chronic pancreatitis; however, recommend correlation with tumor markers (CA 19- 9). Electronically Signed   By: Suzy Bouchard M.D.   On: 01/07/2020 07:46   [place] EKG SITE RITE  Result Date: 01/12/2020 If Site Rite image not attached, placement could not be confirmed due to current cardiac rhythm.  US Abdomen Limited RUQ  Result Date: 01/06/2020 CLINICAL DATA:  Fever and right shoulder pain. EXAM: ULTRASOUND ABDOMEN LIMITED RIGHT UPPER QUADRANT COMPARISON:  None. FINDINGS: Gallbladder: Echogenic sludge is seen within the gallbladder lumen. No gallstones or wall thickening visualized (2.6 mm). No sonographic Murphy sign noted by sonographer. Common bile duct: Diameter: 13.8 mm Liver: 1.2 cm x 1.0 cm x 1.2 cm and 1.2 cm x 0.9 cm x 1.4 cm hypoechoic structures are seen within the right lobe. Intrahepatic biliary dilatation is seen. Diffusely increased echogenicity of the liver parenchyma is noted. Portal vein is patent on color Doppler imaging with normal direction of blood flow towards the liver. Other: None. IMPRESSION: 1. Gallbladder sludge without evidence of gallstones. 2. Dilated common bile duct with intrahepatic biliary dilatation. MRI correlation is recommended, as no obstructing stones or obstructing masses are identified on the current study. 3. Findings likely consistent with hepatic cysts. MRI correlation is again recommended. Electronically Signed   By: Virgina Norfolk M.D.   On: 01/06/2020 23:23    Microbiology: Recent Results (from the past 240 hour(s))  SARS CORONAVIRUS 2 (TAT 6-24 HRS) Nasopharyngeal Nasopharyngeal Swab     Status: None   Collection Time: 01/06/20 11:22 AM   Specimen: Nasopharyngeal Swab  Result Value Ref Range Status   SARS Coronavirus 2 NEGATIVE NEGATIVE Final    Comment: (NOTE) SARS-CoV-2 target nucleic acids are NOT DETECTED.  The SARS-CoV-2 RNA is  generally detectable in upper and lower respiratory specimens during the acute phase of infection. Negative results do not preclude SARS-CoV-2 infection, do not rule out co-infections with other pathogens, and should not be used as the sole basis for treatment or other patient management decisions. Negative results must be combined with clinical observations, patient history, and epidemiological information. The expected result is Negative.  Fact Sheet for Patients: SugarRoll.be  Fact Sheet for Healthcare Providers: https://www.Khamani Daniely-mathews.com/  This test is not yet approved or cleared by the [place] FDA and  has been  authorized for detection and/or diagnosis of SARS-CoV-2 by FDA under an Emergency Use Authorization (EUA). This EUA will remain  in effect (meaning this test can be used) for the duration of the COVID-19 declaration under Se ction 564(b)(1) of the Act, 21 U.S.C. section 360bbb-3(b)(1), unless the authorization is terminated or revoked sooner.  Performed at [facility], [place] [address] [place], [place] [address]   SARS Coronavirus 2 by RT PCR (hospital order, performed in [facility] hospital lab) Nasopharyngeal Nasopharyngeal Swab     Status: None   Collection Time: 01/06/20 10:14 PM   Specimen: Nasopharyngeal Swab  Result Value Ref Range Status   SARS Coronavirus 2 NEGATIVE NEGATIVE Final    Comment: (NOTE) SARS-CoV-2 target nucleic acids are NOT DETECTED.  The SARS-CoV-2 RNA is generally detectable in upper and lower respiratory specimens during the acute phase of infection. The lowest concentration of SARS-CoV-2 viral copies this assay can detect is 250 copies / mL. A negative result does not preclude SARS-CoV-2 infection and should not be used as the sole basis for treatment or other patient management decisions.  A negative result may occur with improper specimen collection / handling, submission of  specimen other than nasopharyngeal swab, presence of viral mutation(s) within the areas targeted by this assay, and inadequate number of viral copies (<250 copies / mL). A negative result must be combined with clinical observations, patient history, and epidemiological information.  Fact Sheet for Patients:   StrictlyIdeas.no  Fact Sheet for Healthcare Providers: BankingDealers.co.za  This test is not yet approved or  cleared by the [place] FDA and has been authorized for detection and/or diagnosis of SARS-CoV-2 by FDA under an Emergency Use Authorization (EUA).  This EUA will remain in effect (meaning this test can be used) for the duration of the COVID-19 declaration under Section 564(b)(1) of the Act, 21 U.S.C. section 360bbb-3(b)(1), unless the authorization is terminated or revoked sooner.  Performed at [facility], [place] [address] [place], [place] [address]   Culture, blood (routine x 2)     Status: None   Collection Time: 01/07/20 12:08 AM   Specimen: BLOOD LEFT HAND  Result Value Ref Range Status   Specimen Description BLOOD LEFT HAND  Final   Special Requests   Final    BOTTLES DRAWN AEROBIC AND ANAEROBIC Blood Culture results may not be optimal due to an excessive volume of blood received in culture bottles   Culture   Final    NO GROWTH 5 DAYS Performed at [facility], [place] [address], [place], [place] [address]    Report Status 01/12/2020 FINAL  Final  Culture, blood (routine x 2)     Status: None   Collection Time: 01/07/20 12:10 AM   Specimen: BLOOD RIGHT HAND  Result Value Ref Range Status   Specimen Description BLOOD RIGHT HAND  Final   Special Requests   Final    BOTTLES DRAWN AEROBIC AND ANAEROBIC Blood Culture adequate volume   Culture   Final    NO GROWTH 5 DAYS Performed at [facility], [place] [address], [place], [place] [address]    Report Status 01/12/2020 FINAL  Final  Culture,  Urine     Status: None   Collection Time: 01/07/20  1:28 AM   Specimen: Urine, Random  Result Value Ref Range Status   Specimen Description URINE, RANDOM  Final   Special Requests NONE  Final   Culture   Final  NO GROWTH Performed at [facility], [place] [address], [place], [place] [address]    Report Status 01/09/2020 FINAL  Final  Aerobic/Anaerobic Culture (surgical/deep wound)     Status: None (Preliminary result)   Collection Time: 01/09/20 11:30 AM   Specimen: Abscess  Result Value Ref Range Status   Specimen Description ABSCESS  Final   Special Requests LIVER  Final   Gram Stain NO WBC SEEN NO ORGANISMS SEEN   Final   Culture   Final    NO GROWTH 3 DAYS Performed at [facility], [address], [place], [place] [address]    Report Status PENDING  Incomplete     Labs: Basic Metabolic Panel: Recent Labs  Lab 01/06/20 1332 01/07/20 0256 01/10/20 0551 01/11/20 1113 01/12/20 0819  NA 134* 135 137 140 137  K 3.9 3.6 3.1* 3.2* 3.9  CL 102 107 105 107 108  CO2 21* 20* 24 25 19*  GLUCOSE 119* 121* 147* 142* 118*  BUN _0 <5*  CREATININE 0.82 0.74 0.45 0.65 0.47  CALCIUM 9.6 9.1 8.9 8.6* 8.9  MG  --   --   --  1.9 2.2  PHOS  --   --   --  1.7* 3.0   Liver Function Tests: Recent Labs  Lab 01/06/20 1332 01/07/20 0256 01/10/20 0551 01/11/20 1113 01/12/20 0819  AST 30 32 12* 16 22  ALT 88* 77* 38 29 30  ALKPHOS 224* 184* 160* 139* 132*  BILITOT 1.5* 1.5* 0.7 0.5 0.5  PROT 7.0 5.7* 5.3* 5.2* 5.3*  ALBUMIN 3.3* 2.7* 2.3* 2.1* 2.2*   Recent Labs  Lab 01/06/20 1332 01/11/20 1113  LIPASE 32 48   No results for input(s): AMMONIA in the last 168 hours. CBC: Recent Labs  Lab 01/06/20 1332 01/07/20 0256 01/10/20 0551 01/11/20 1113 01/12/20 0819  WBC 17.8* 14.5* 9.2 9.4 9.0  NEUTROABS  --   --   --  7.3 7.2  HGB 13.5 11.3* 11.0* 10.7* 10.7*  HCT 41.2 33.9* 32.6* 32.4* 33.2*  MCV 94.9 92.9 91.8 93.4 93.8  PLT 233 178 225 271 286    Cardiac Enzymes: No results for input(s): CKTOTAL, CKMB, CKMBINDEX, TROPONINI in the last 168 hours. BNP: BNP (last 3 results) No results for input(s): BNP in the last 8760 hours.  ProBNP (last 3 results) No results for input(s): PROBNP in the last 8760 hours.  CBG: No results for input(s): GLUCAP in the last 168 hours.     Signed:  Dia Crawford, MD [facility] [phone] pager

## 2020-01-13 NOTE — Plan of Care (Signed)
  Problem: Pain Managment: Goal: General experience of comfort will improve Outcome: Progressing   

## 2020-01-13 NOTE — Progress Notes (Signed)
Peripherally Inserted Central Catheter Placement  The IV Nurse has discussed with the patient and/or persons authorized to consent for the patient, the purpose of this procedure and the potential benefits and risks involved with this procedure.  The benefits include less needle sticks, lab draws from the catheter, and the patient may be discharged home with the catheter. Risks include, but not limited to, infection, bleeding, blood clot (thrombus formation), and puncture of an artery; nerve damage and irregular heartbeat and possibility to perform a PICC exchange if needed/ordered by physician.  Alternatives to this procedure were also discussed.  Bard Power PICC patient education guide, fact sheet on infection prevention and patient information card has been provided to patient /or left at bedside.    PICC Placement Documentation  PICC Single Lumen 01/13/20 PICC Right Basilic 39 cm 0 cm (Active)  Indication for Insertion or Continuance of Line Home intravenous therapies (PICC only) 01/13/20 0900  Exposed Catheter (cm) 0 cm 01/13/20 0900  Site Assessment Clean;Dry;Intact 01/13/20 0900  Line Status Flushed;Blood return noted 01/13/20 0900  Dressing Type Transparent 01/13/20 0900  Dressing Status Clean;Dry;Intact;Antimicrobial disc in place 01/13/20 0900  Dressing Change Due 01/20/20 01/13/20 0900       Stacie Glaze Horton 01/13/2020, 9:47 AM

## 2020-01-13 NOTE — Progress Notes (Signed)
Patient ID: Anita Poole, female   DOB: [birth year], 54 y.o.   MRN: [id number]         [facility] Disease    Date of Admission:  01/06/2020   Total days of antibiotics 8         She is feeling much better.  Her PICC has been placed.  She is ready for discharge on IV ceftriaxone and oral metronidazole for her multiple liver abscesses.  Follow-up in my clinic.         Cliffton Asters, MD [facility] for Infectious Disease [facility] [phone] pager   [phone] cell 01/13/2020, 11:12 AM

## 2020-01-14 LAB — AEROBIC/ANAEROBIC CULTURE W GRAM STAIN (SURGICAL/DEEP WOUND)
Culture: NO GROWTH
Gram Stain: NONE SEEN

## 2020-01-18 ENCOUNTER — Other Ambulatory Visit: Payer: Self-pay | Admitting: Primary Care

## 2020-01-18 DIAGNOSIS — E785 Hyperlipidemia, unspecified: Secondary | ICD-10-CM

## 2020-01-18 DIAGNOSIS — R7303 Prediabetes: Secondary | ICD-10-CM

## 2020-01-24 ENCOUNTER — Encounter: Payer: Self-pay | Admitting: Internal Medicine

## 2020-01-24 ENCOUNTER — Ambulatory Visit: Payer: PRIVATE HEALTH INSURANCE | Admitting: Internal Medicine

## 2020-01-24 ENCOUNTER — Other Ambulatory Visit: Payer: Self-pay

## 2020-01-24 DIAGNOSIS — K750 Abscess of liver: Secondary | ICD-10-CM

## 2020-01-24 NOTE — Progress Notes (Signed)
[facility] for Infectious Disease  Patient Active Problem List   Diagnosis Date Noted  . Hepatic abscess 01/07/2020    Priority: High  . Depression 01/07/2020  . Fever 01/06/2020  . Chronic constipation 07/29/2019  . Gastroesophageal reflux disease 06/22/2018  . Hyperlipidemia 10/24/2016  . Preventative health care 10/24/2016  . Moderate episode of recurrent major depressive disorder ([place]) 09/12/2016  . Tobacco abuse 09/12/2016  . Chronic pancreatitis ([place]) 01/26/2016  . Prediabetes 01/26/2016  . Choledocholithiasis 01/26/2016    Patient's Medications  New Prescriptions   No medications on file  Previous Medications   ACETAMINOPHEN (TYLENOL) 500 MG TABLET    Take 1,000-1,500 mg by mouth daily as needed for mild pain.   BUPROPION (WELLBUTRIN SR) 150 MG 12 HR TABLET    TAKE 1 TABLET (150 MG TOTAL) BY MOUTH 2 (TWO) TIMES DAILY. FOR DEPRESSION.   CEFTRIAXONE (ROCEPHIN) IVPB    Inject 2 g into the vein daily for 20 days. Indication:  Liver abscesses First Dose: Yes Last Day of Therapy:  02/01/20 Labs - Once weekly:  CBC/D and BMP, Labs - Every other week:  ESR and CRP Method of administration: IV Push Method of administration may be changed at the discretion of home infusion pharmacist based upon assessment of the patient and/or caregiver's ability to self-administer the medication ordered.   CEFTRIAXONE 2 G IN SODIUM CHLORIDE 0.9 % 100 ML    Inject 2 g into the vein daily for 21 days.   FAMOTIDINE (PEPCID) 20 MG TABLET    TAKE 1 TABLET BY MOUTH ONCE TO TWICE DAILY FOR HEARTBURN.   GABAPENTIN (NEURONTIN) 300 MG CAPSULE    Take 2 capsules (600 mg total) by mouth 2 (two) times daily. As needed for pain.   METRONIDAZOLE (FLAGYL) 500 MG TABLET    Take 1 tablet (500 mg total) by mouth 3 (three) times daily for 20 days. Please provide sufficient quantity to treat through 02/01/20   METRONIDAZOLE (FLAGYL) 500 MG TABLET    Take 1 tablet (500 mg total) by mouth every 8 (eight)  hours for 21 days.   MULTIPLE VITAMINS-MINERALS (CENTRUM SILVER ADULT 50+) TABS    Take 1 tablet by mouth daily.   ONDANSETRON (ZOFRAN) 4 MG TABLET    TAKE 1 TABLET BY MOUTH EVERY 6 HOURS AS NEEDED FOR NAUSEA AND VOMITING. QTY/DAY SUPPLY PER INSURANCE   PANCRELIPASE, LIP-PROT-AMYL, (CREON) 24000-76000 UNITS CPEP    TAKE 2 CAPSULE WITH MEALS ( 1 AFTER BEGINNING,1 WHEN FINISHED EATING). TAKE 1 CAPSULE WITH SNACK (IMMEDIATELY AFTER BEGINNING) TWICE DAILY.  Modified Medications   No medications on file  Discontinued Medications   No medications on file    Subjective: Perpetua is in for her hospital follow-up visit.  She a history of chronic, idiopathic pancreatitis who had sudden onset of fever, chills and sweats 2 weeks ago.  The fevers seem to abate but then came back and were associated with right upper quadrant abdominal pain that radiated to the right side of her neck.  She was admitted about 3 weeks ago.  Ultrasound and MRI showed multiple small cystic lesions were noted in the liver.  2 of the lesions were aspirated on 01/09/2020 yielding pus.  Interestingly no white blood cells were seen on Gram stain.  No organisms were seen.  Abscess cultures were obtained 4 days after starting antibiotics and were negative.  Blood cultures are also negative.    Cytology was negative for any signs  of malignancy.  She was discharged on IV ceftriaxone and oral metronidazole and has now completed 19 days of therapy.  She has not had any further fever or chills.  She continues to have poor appetite with occasional nausea and vomiting.  She has not had any problems tolerating her PICC.  Review of Systems: Review of Systems  Constitutional: Positive for weight loss. Negative for chills and fever.       She has lost about 13 pounds.  She is in good spirits.  Respiratory: Positive for shortness of breath. Negative for cough.   Cardiovascular: Positive for chest pain.  Gastrointestinal: Positive for abdominal pain,  nausea and vomiting. Negative for diarrhea.    Past Medical History:  Diagnosis Date  . Chronic pancreatitis ([place])   . Depression   . GERD (gastroesophageal reflux disease)   . Medical history non-contributory     Social History   Tobacco Use  . Smoking status: Current Some Day Smoker    Packs/day: 0.30    Years: 30.00    Pack years: 9.00    Types: Cigarettes  . Smokeless tobacco: Never Used  Substance Use Topics  . Alcohol use: No  . Drug use: No    Family History  Problem Relation Age of Onset  . Diabetes Mother   . Kidney failure Mother   . Hypertension Mother   . Coronary artery disease Mother   . Heart disease Mother   . Lung cancer Father     No Known Allergies  Objective: Vitals:   01/24/20 1051  BP: 123/85  Pulse: 99  Temp: 98.1 F (36.7 C)  TempSrc: Oral  Weight: 153 lb (69.4 kg)   Body mass index is 26.26 kg/m.  Physical Exam Constitutional:      General: She is not in acute distress. Cardiovascular:     Rate and Rhythm: Normal rate and regular rhythm.     Heart sounds: No murmur heard.   Pulmonary:     Effort: Pulmonary effort is normal.     Breath sounds: Normal breath sounds.  Abdominal:     Palpations: Abdomen is soft.     Tenderness: There is abdominal tenderness.     Comments: She has some right upper quadrant tenderness with palpation.  I cannot palpate her liver edge.  Skin:    Findings: No rash.     Comments: Right arm PICC site looks good.  Psychiatric:        Mood and Affect: Mood normal.     Lab Results    Problem List Items Addressed This Visit      High   Hepatic abscess    She is beginning to improve on therapy for multiple small liver abscesses.  I will obtain a follow-up CT scan of her abdomen and see her back in 3 weeks.      Relevant Orders   CT ABDOMEN W CONTRAST       Michel Bickers, MD [facility] [place] [phone] pager   [phone] cell 01/24/2020, 12:00  PM

## 2020-01-24 NOTE — Assessment & Plan Note (Signed)
She is beginning to improve on therapy for multiple small liver abscesses.  I will obtain a follow-up CT scan of her abdomen and see her back in 3 weeks.

## 2020-01-25 ENCOUNTER — Telehealth: Payer: Self-pay | Admitting: Primary Care

## 2020-01-25 NOTE — Telephone Encounter (Signed)
Pt would like to know if she still needs to get labs done on Friday if she is already doing labs every week? She would like a call back.

## 2020-01-25 NOTE — Telephone Encounter (Signed)
If she is getting her labs drawn weekly at another doctor's office, have them draw the labs I need which include a lipid panel and A1c.  If they can draw the labs that require, then okay to cancel upcoming lab appointment.  Labs are in epic.

## 2020-01-25 NOTE — Telephone Encounter (Signed)
Spoken and notified patient of Anita Poole comments. Patient will check and let us know if this can be done or not. Patient will call back.

## 2020-02-01 ENCOUNTER — Telehealth: Payer: Self-pay

## 2020-02-01 NOTE — Telephone Encounter (Signed)
Please give an order to extend her IV antibiotic therapy through 02/15/2020 when she follows up with me.  Her follow-up CT scan is scheduled for 02/09/2020.

## 2020-02-01 NOTE — Telephone Encounter (Signed)
Anita Poole with [facility] called to ask for order to pull picc line for the patient. Patient will finishing her last round of antibiotics today.  Anita Poole

## 2020-02-02 ENCOUNTER — Other Ambulatory Visit: Payer: Self-pay | Admitting: Internal Medicine

## 2020-02-02 MED ORDER — METRONIDAZOLE 500 MG PO TABS: 500.0000 mg | ORAL_TABLET | Freq: Three times a day (TID) | ORAL | 0 refills | Status: DC

## 2020-02-02 NOTE — Telephone Encounter (Signed)
Anita Poole with [facility] has been given verbal orders to extend IV antibiotic for the patient until 02/15/20. Anita Poole verbalized understanding. Patient has also been informed that her IV antibiotic will be extended until 02/15/20. Patient would also like to know if she need to continue the oral antibiotic, she will be taking her last dose today.

## 2020-02-02 NOTE — Telephone Encounter (Signed)
Please let her know that I sent in a refill for metronidazole to last through 02/15/2020.

## 2020-02-02 NOTE — Telephone Encounter (Signed)
Patient advised she is to continue the metronidazole through 02/15/20 and a refil has been sent to her pharmacy. Patient verbalized understanding. Amayrani Bennick T Pricilla Loveless

## 2020-02-03 ENCOUNTER — Other Ambulatory Visit: Payer: Self-pay

## 2020-02-03 ENCOUNTER — Other Ambulatory Visit (INDEPENDENT_AMBULATORY_CARE_PROVIDER_SITE_OTHER): Payer: PRIVATE HEALTH INSURANCE

## 2020-02-03 DIAGNOSIS — R7303 Prediabetes: Secondary | ICD-10-CM

## 2020-02-03 DIAGNOSIS — E785 Hyperlipidemia, unspecified: Secondary | ICD-10-CM

## 2020-02-03 NOTE — Addendum Note (Signed)
Addended by: Aquilla Solian on: 02/03/2020 02:31 PM   Modules accepted: Orders

## 2020-02-06 ENCOUNTER — Encounter: Payer: Self-pay | Admitting: Internal Medicine

## 2020-02-06 LAB — LIPID PANEL

## 2020-02-06 LAB — HEMOGLOBIN A1C
Hgb A1c MFr Bld: 5.5 % of total Hgb (ref <5.7)
Mean Plasma Glucose: 111 (calc)
eAG (mmol/L): 6.2 (calc)

## 2020-02-06 NOTE — Telephone Encounter (Signed)
Patient's follow up has been moved from 7/28 to 8/9. She would like to know if this will affect her IV antibiotic end date (previously extended to 7/28 office visit, CT scheduled for 7/22).  Patient would prefer not to have the PICC continue to 8/9 if it is not necessary.  Please advise. She is continuing the ceftriaxone q 24 hours IV and metronidazole q 8 hours.  She feels improved, pain is still occasionally present, still with occasional nausea but no vomiting. No fevers or diarrhea. She has sporadic bilateral lower edema, more Right than Left, now persisting overnight. She estimates this happens about 3 times a week, started 2 weeks ago. Routine weekly blood work scheduled for this evening. Andree Coss, RN

## 2020-02-06 NOTE — Telephone Encounter (Signed)
Hello, the change in her visit date will not affect when the PICC can come out.  Please let her know that I will call her once I have her CT scan report.

## 2020-02-06 NOTE — Telephone Encounter (Signed)
Relayed to patient. She was grateful. Andree Coss, RN

## 2020-02-09 ENCOUNTER — Telehealth: Payer: Self-pay | Admitting: Internal Medicine

## 2020-02-09 ENCOUNTER — Ambulatory Visit (HOSPITAL_COMMUNITY)
Admission: RE | Admit: 2020-02-09 | Discharge: 2020-02-09 | Disposition: A | Discharge status: Home / Self Care | Payer: PRIVATE HEALTH INSURANCE | Source: Ambulatory Visit | Attending: Internal Medicine | Admitting: Internal Medicine

## 2020-02-09 ENCOUNTER — Other Ambulatory Visit: Payer: Self-pay

## 2020-02-09 ENCOUNTER — Other Ambulatory Visit: Payer: Self-pay | Admitting: Internal Medicine

## 2020-02-09 ENCOUNTER — Telehealth: Payer: Self-pay | Admitting: Primary Care

## 2020-02-09 DIAGNOSIS — K750 Abscess of liver: Secondary | ICD-10-CM | POA: Diagnosis not present

## 2020-02-09 MED ORDER — SODIUM CHLORIDE (PF) 0.9 % IJ SOLN
INTRAMUSCULAR | Status: AC
  Filled 2020-02-09: qty 50

## 2020-02-09 MED ORDER — IOHEXOL 300 MG/ML  SOLN
100.0000 mL | Freq: Once | INTRAMUSCULAR | Status: AC | PRN
  Administered 2020-02-09: 100 mL via INTRAVENOUS

## 2020-02-09 NOTE — Telephone Encounter (Signed)
Patient returned provider call. Provider note reviewed with patient who is happy to have PICC pulled. Advanced notified and given pull PICC orders to [facility].   Provider notified.  Jameison Haji Loyola Mast, RN

## 2020-02-09 NOTE — Telephone Encounter (Signed)
Noted. Will keep eye out for it if received.

## 2020-02-09 NOTE — Telephone Encounter (Signed)
[facility] called stating they are faxing over test that weren't performed on patient to our office.

## 2020-02-09 NOTE — Telephone Encounter (Signed)
Anita Poole has now completed 5 weeks of antibiotic therapy for her liver abscesses.  She had a follow-up CT scan this morning which showed:  IMPRESSION: 1. Numerous previously noted liver lesions are almost completely resolved, with faintly visible residua in the liver dome and lateral right lobe of the liver. MRI would be the test of choice to assess for persistent fluid if there are ongoing infectious signs and symptoms.  I called and left a message on her voicemail letting her know that I am comfortable having her PICC removed and stopping ceftriaxone and metronidazole now.  I asked her to call back to let us know if she is comfortable with that recommendation.

## 2020-02-10 ENCOUNTER — Other Ambulatory Visit (INDEPENDENT_AMBULATORY_CARE_PROVIDER_SITE_OTHER): Payer: PRIVATE HEALTH INSURANCE

## 2020-02-10 DIAGNOSIS — E785 Hyperlipidemia, unspecified: Secondary | ICD-10-CM | POA: Diagnosis not present

## 2020-02-10 LAB — LIPID PANEL
Cholesterol: 197 mg/dL (ref 0–200)
HDL: 49.2 mg/dL (ref >39.00)
LDL Cholesterol: 121 mg/dL — ABNORMAL HIGH (ref 0–99)
NonHDL: 147.6
Total CHOL/HDL Ratio: 4
Triglycerides: 132 mg/dL (ref 0.0–149.0)
VLDL: 26.4 mg/dL (ref 0.0–40.0)

## 2020-02-13 NOTE — Telephone Encounter (Signed)
received fax and place in Kate's inbox

## 2020-02-13 NOTE — Telephone Encounter (Signed)
Reviewed

## 2020-02-15 ENCOUNTER — Ambulatory Visit: Payer: PRIVATE HEALTH INSURANCE | Admitting: Internal Medicine

## 2020-02-24 ENCOUNTER — Telehealth: Payer: Self-pay

## 2020-02-24 NOTE — Telephone Encounter (Signed)
COVID-19 Pre-Screening Questions:02/24/20  Do you currently have a fever (>100 F), chills or unexplained body aches?NO  Are you currently experiencing new cough, shortness of breath, sore throat, runny nose? NO .  Have you been in contact with someone that is currently pending confirmation of Covid19 testing or has been confirmed to have the Covid19 virus?  NO  **If the patient answers NO to ALL questions -  advise the patient to please call the clinic before coming to the office should any symptoms develop.     

## 2020-02-27 ENCOUNTER — Other Ambulatory Visit: Payer: Self-pay

## 2020-02-27 ENCOUNTER — Ambulatory Visit: Payer: PRIVATE HEALTH INSURANCE | Admitting: Internal Medicine

## 2020-02-27 ENCOUNTER — Encounter: Payer: Self-pay | Admitting: Internal Medicine

## 2020-02-27 DIAGNOSIS — K750 Abscess of liver: Secondary | ICD-10-CM

## 2020-02-27 NOTE — Assessment & Plan Note (Signed)
I am hopeful that her abscesses have been cured but I am concerned that she is having intermittent night sweats.  I asked her to take her temperature before bedtime each night.  She knows to call me right away if she has documented fever or other signs or symptoms to suggest relapse.  She will follow up here in 6 weeks.

## 2020-02-27 NOTE — Progress Notes (Signed)
[facility]  Patient Active Problem List   Diagnosis Date Noted  . Hepatic abscess 01/07/2020    Priority: High  . Depression 01/07/2020  . Fever 01/06/2020  . Chronic constipation 07/29/2019  . Gastroesophageal reflux disease 06/22/2018  . Hyperlipidemia 10/24/2016  . Preventative health care 10/24/2016  . 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 Tobacco abuse 09/12/2016  . Chronic pancreatitis ([facility]) 01/26/2016  . Prediabetes 01/26/2016  . Choledocholithiasis 01/26/2016    Patient's Medications  New Prescriptions   No medications on file  Previous Medications   ACETAMINOPHEN (TYLENOL) 500 MG TABLET    Take 1,000-1,500 mg by mouth daily as needed for mild pain.   BUPROPION (WELLBUTRIN SR) 150 MG 12 HR TABLET    TAKE 1 TABLET (150 MG TOTAL) BY MOUTH 2 (TWO) TIMES DAILY. FOR DEPRESSION.   FAMOTIDINE (PEPCID) 20 MG TABLET    TAKE 1 TABLET BY MOUTH ONCE TO TWICE DAILY FOR HEARTBURN.   GABAPENTIN (NEURONTIN) 300 MG CAPSULE    Take 2 capsules (600 mg total) by mouth 2 (two) times daily. As needed for pain.   MULTIPLE VITAMINS-MINERALS (CENTRUM SILVER ADULT 50+) TABS    Take 1 tablet by mouth daily.   ONDANSETRON (ZOFRAN) 4 MG TABLET    TAKE 1 TABLET BY MOUTH EVERY 6 HOURS AS NEEDED FOR NAUSEA AND VOMITING. QTY/DAY SUPPLY PER INSURANCE   PANCRELIPASE, LIP-PROT-AMYL, (CREON) 24000-76000 UNITS CPEP    TAKE 2 CAPSULE WITH MEALS ( 1 AFTER BEGINNING,1 WHEN FINISHED EATING). TAKE 1 CAPSULE WITH SNACK (IMMEDIATELY AFTER BEGINNING) TWICE DAILY.  Modified Medications   No medications on file  Discontinued Medications   No medications on file    Subjective: Anita Poole is in for her routine follow-up visit.  She completed 5 weeks of ceftriaxone and metronidazole for her multiple liver abscesses on 02/09/2020.  Her abscess cultures were negative.  Follow-up CT scan on 02/09/2020 showed marked improvement.  She has not had any  increase in abdominal pain recently she has not had any documented fever or chills but she has had some episodes of night sweats.  She still has some nausea which she had before her abscesses were diagnosed.  She has not taken the Covid vaccine.  She does not feel that she is at risk.  Review of Systems: Review of Systems  Constitutional: Positive for diaphoresis. Negative for chills, fever and weight loss.  Respiratory: Negative for shortness of breath.   Cardiovascular: Negative for chest pain.  Gastrointestinal: Positive for abdominal pain and nausea. Negative for diarrhea and vomiting.    Past Medical History:  Diagnosis Date  . Chronic pancreatitis ([facility])   . Depression   . GERD (gastroesophageal reflux disease)   . Medical history non-contributory     Social History   Tobacco Use  . Smoking status: Current Some Day Smoker    Packs/day: 0.30    Years: 30.00    Pack years: 9.00    Types: Cigarettes  . Smokeless tobacco: Never Used  Substance Use Topics  . Alcohol use: No  . Drug use: No    Family History  Problem Relation Age of Onset  . Diabetes Mother   . Kidney failure Mother   . Hypertension Mother   . Coronary artery disease Mother   . Heart disease Mother   . Lung cancer Father     No Known Allergies  Objective: Vitals:   02/27/20 1010  BP: (!) 149/93  Pulse: 88  Temp: 97.7 F (36.5 C)  SpO2: 98%  Weight: 149 lb (67.6 kg)   Body mass index is 25.58 kg/m.  Physical Exam Constitutional:      General: She is not in acute distress. Cardiovascular:     Rate and Rhythm: Normal rate and regular rhythm.     Heart sounds: No murmur heard.   Pulmonary:     Effort: Pulmonary effort is normal.     Breath sounds: Normal breath sounds.  Abdominal:     General: There is no distension.     Palpations: Abdomen is soft. There is no mass.     Tenderness: There is no abdominal tenderness.  Skin:    Findings: Bruising present.  Psychiatric:        Mood  and Affect: Mood normal.     Lab Results    Problem List Items Addressed This Visit      High   Hepatic abscess    I am hopeful that her abscesses have been cured but I am concerned that she is having intermittent night sweats.  I asked her to take her temperature before bedtime each night.  She knows to call me right away if she has documented fever or other signs or symptoms to suggest relapse.  She will follow up here in 6 weeks.          Cliffton Asters, MD [facility] for [facility] [phone] pager   [phone] cell 02/27/2020, 10:23 AM

## 2020-03-28 ENCOUNTER — Other Ambulatory Visit: Payer: Self-pay

## 2020-03-28 ENCOUNTER — Ambulatory Visit (INDEPENDENT_AMBULATORY_CARE_PROVIDER_SITE_OTHER): Payer: PRIVATE HEALTH INSURANCE | Admitting: Internal Medicine

## 2020-03-28 ENCOUNTER — Encounter: Payer: Self-pay | Admitting: Internal Medicine

## 2020-03-28 DIAGNOSIS — K750 Abscess of liver: Secondary | ICD-10-CM | POA: Diagnosis not present

## 2020-03-28 NOTE — Progress Notes (Signed)
[facility]  Patient Active Problem List   Diagnosis Date Noted  . Hepatic abscess 01/07/2020    Priority: High  . Depression 01/07/2020  . Fever 01/06/2020  . Chronic constipation 07/29/2019  . Gastroesophageal reflux disease 06/22/2018  . Hyperlipidemia 10/24/2016  . Preventative health care 10/24/2016  . 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 Tobacco abuse 09/12/2016  . Chronic pancreatitis ([facility]) 01/26/2016  . Prediabetes 01/26/2016  . Choledocholithiasis 01/26/2016    Patient's Medications  New Prescriptions   No medications on file  Previous Medications   ACETAMINOPHEN (TYLENOL) 500 MG TABLET    Take 1,000-1,500 mg by mouth daily as needed for mild pain.   BUPROPION (WELLBUTRIN SR) 150 MG 12 HR TABLET    TAKE 1 TABLET (150 MG TOTAL) BY MOUTH 2 (TWO) TIMES DAILY. FOR DEPRESSION.   FAMOTIDINE (PEPCID) 20 MG TABLET    TAKE 1 TABLET BY MOUTH ONCE TO TWICE DAILY FOR HEARTBURN.   GABAPENTIN (NEURONTIN) 300 MG CAPSULE    Take 2 capsules (600 mg total) by mouth 2 (two) times daily. As needed for pain.   MULTIPLE VITAMINS-MINERALS (CENTRUM SILVER ADULT 50+) TABS    Take 1 tablet by mouth daily.   ONDANSETRON (ZOFRAN) 4 MG TABLET    TAKE 1 TABLET BY MOUTH EVERY 6 HOURS AS NEEDED FOR NAUSEA AND VOMITING. QTY/DAY SUPPLY PER INSURANCE   PANCRELIPASE, LIP-PROT-AMYL, (CREON) 24000-76000 UNITS CPEP    TAKE 2 CAPSULE WITH MEALS ( 1 AFTER BEGINNING,1 WHEN FINISHED EATING). TAKE 1 CAPSULE WITH SNACK (IMMEDIATELY AFTER BEGINNING) TWICE DAILY.  Modified Medications   No medications on file  Discontinued Medications   No medications on file    Subjective: Anita Poole is in for her routine follow-up visit.  She completed 5 weeks of ceftriaxone and metronidazole for her multiple liver abscesses on 02/09/2020.  Her abscess cultures were negative.  Follow-up CT scan on 02/09/2020 showed marked improvement.  She has not had any  abdominal pain recently. She has not had any documented fever or chills but she has had some episodes of night sweats.  She still has some nausea which she had before her abscesses were diagnosed.    Review of Systems: Review of Systems  Constitutional: Positive for diaphoresis. Negative for chills, fever and weight loss.  Respiratory: Negative for shortness of breath.   Cardiovascular: Negative for chest pain.  Gastrointestinal: Positive for nausea. Negative for abdominal pain, diarrhea and vomiting.    Past Medical History:  Diagnosis Date  . Chronic pancreatitis ([facility])   . Depression   . GERD (gastroesophageal reflux disease)   . Medical history non-contributory     Social History   Tobacco Use  . Smoking status: Current Some Day Smoker    Packs/day: 0.30    Years: 30.00    Pack years: 9.00    Types: Cigarettes  . Smokeless tobacco: Never Used  Substance Use Topics  . Alcohol use: No  . Drug use: No    Family History  Problem Relation Age of Onset  . Diabetes Mother   . Kidney failure Mother   . Hypertension Mother   . Coronary artery disease Mother   . Heart disease Mother   . Lung cancer Father     No Known Allergies  Objective: Vitals:   03/28/20 1612  BP: (!) 147/90  Pulse: 82  Resp: 16  Temp: (!) 97.5 F (36.4 C)  SpO2: 98%  Weight: 151 lb 3.2 oz (68.6 kg)  Height: 5\' 4"  (1.626 m)   Body mass index is 25.95 kg/m.  Physical Exam Constitutional:      General: She is not in acute distress. Cardiovascular:     Rate and Rhythm: Normal rate and regular rhythm.     Heart sounds: No murmur heard.   Pulmonary:     Effort: Pulmonary effort is normal.  Abdominal:     General: There is no distension.     Palpations: Abdomen is soft. There is no mass.     Tenderness: There is no abdominal tenderness.  Psychiatric:        Mood and Affect: Mood normal.      Problem List Items Addressed This Visit      High   Hepatic abscess    I believe that  her liver abscesses have been cured.  She can follow-up here as needed.          , MD [facility] [phone] pager   [phone] cell 03/28/2020, 4:26 PM

## 2020-03-28 NOTE — Assessment & Plan Note (Signed)
I believe that her liver abscesses have been cured.  She can follow-up here as needed.

## 2020-07-02 ENCOUNTER — Encounter: Payer: Self-pay | Admitting: Primary Care

## 2020-07-02 ENCOUNTER — Telehealth (INDEPENDENT_AMBULATORY_CARE_PROVIDER_SITE_OTHER): Payer: PRIVATE HEALTH INSURANCE | Admitting: Primary Care

## 2020-07-02 VITALS — Ht 64.0 in | Wt 151.0 lb

## 2020-07-02 DIAGNOSIS — H1031 Unspecified acute conjunctivitis, right eye: Secondary | ICD-10-CM | POA: Diagnosis not present

## 2020-07-02 MED ORDER — CIPROFLOXACIN HCL 0.3 % OP SOLN: OPHTHALMIC | 0 refills | Status: DC

## 2020-07-02 NOTE — Patient Instructions (Signed)
Start using the eye drops as discussed. Make sure you look at the instructions carefully.  You may return to work tomorrow, I will attach a work note to your account.  Please update me if no improvement in 3 to 4 days.  It was a pleasure to see you today!

## 2020-07-02 NOTE — Telephone Encounter (Signed)
Per chart review pt has already had a video visit today with Allayne Gitelman NP.

## 2020-07-02 NOTE — Assessment & Plan Note (Signed)
Acute swelling and erythema to right eyelids, also injection to sclera x24 hours.  Symptoms could be secondary to allergic conjunctivitis, but given evidence of swelling with erythema from picture, will treat for potential bacterial involvement  Prescription for ciprofloxacin drops sent to pharmacy.  Discussed instructions for use. We will have her return to work tomorrow rather than today. Work note provided.

## 2020-07-02 NOTE — Telephone Encounter (Signed)
[facility] Night - Client Nonclinical Telephone Record AccessNurse Client [facility] Night - Client Client Site [facility] - Night Physician Vernona Rieger - NP Contact Type Call Who Is Calling Patient / Member / Family / Caregiver Caller Name Dejana Pugsley Caller Phone Number [phone] Patient Name Anita Poole Patient DOB [birth year] Call Type Message Only Information Provided Reason for Call Request to Schedule Office Appointment Initial Comment Caller states she needs appointment. Her eye is swollen and painful. Additional Comment Office hours provided. Triage refused. Disp. Time Disposition Final User 07/02/2020 7:29:41 AM General Information Provided Yes Tessa Lerner Call Closed By: Tessa Lerner Transaction Date/Time: 07/02/2020 7:27:12 AM (ET)

## 2020-07-02 NOTE — Progress Notes (Signed)
Subjective:    Patient ID: Anita Poole, female    DOB: 1965-11-11, 54 y.o.   MRN: [id number]  HPI     Anita Poole - 54 y.o. female  MRN [id number]  Date of Birth: [birth year]  PCP: Doreene Nest, NP  This service was provided via telemedicine. Phone Visit performed on 07/02/2020    Rationale for phone visit along with limitations reviewed. I discussed the limitations, risks, security and privacy concerns of performing a phone visit and the availability of in person appointments. I also discussed with the patient that there may be a patient responsible charge related to this service. Patient consented to telephone encounter.    Location of patient: Patient Location of provider: Office at [facility] @ [facility] Participants: Patient and myself Name of referring provider: N/A   Names of persons and role in encounter: Provider: Doreene Nest, NP  Patient: Anita Poole  Other: N/A   Time on call: 6 min - 35 sec   Subjective: Chief Complaint  Patient presents with  . Conjunctivitis    Right eye x 1 day. Red itchy and painful with some d/c this morning      HPI:  Ms. Anita Poole is a 54 year old female with a history of chronic pancreatitis, GERD, prediabetes, tobacco abuse, hyperlipidemia who presents today with a chief complaint of eye swelling.  She also reports slight blurred vision, erythema around the eye lids top and bottom, injection to the sclera, pain, itching that began today. She lives alone. She's been home all weekend, no interaction with anyone.  She attached a picture of her eye to the MyChart portal.  She had to leave work today due to symptoms.   Objective/Observations:  Moderate swelling and erythema to right eyelids. See picture attached  No physical exam or vital signs collected unless specifically identified below.   Ht 5\' 4"  (1.626 m)   Wt 151 lb (68.5 kg)   BMI 25.92 kg/m    Respiratory status: speaks in complete sentences without  evident shortness of breath.   Assessment/Plan:  Acute swelling and erythema to right eyelids, also injection to sclera x24 hours.  Symptoms could be secondary to allergic conjunctivitis, but given evidence of swelling with erythema from picture, will treat for potential bacterial involvement  Prescription for ciprofloxacin drops sent to pharmacy.  Discussed instructions for use. We will have her return to work tomorrow rather than today. Work note provided.  No problem-specific Assessment & Plan notes found for this encounter.   I discussed the assessment and treatment plan with the patient. The patient was provided an opportunity to ask questions and all were answered. The patient agreed with the plan and demonstrated an understanding of the instructions.  Lab Orders  No laboratory test(s) ordered today    No orders of the defined types were placed in this encounter.   The patient was advised to call back or seek an in-person evaluation if the symptoms worsen or if the condition fails to improve as anticipated.  , NP    Review of Systems  Constitutional: Negative for fever.  HENT: Negative for congestion.   Eyes: Positive for pain, discharge, redness, itching and visual disturbance.  Respiratory: Negative for cough.        Past Medical History:  Diagnosis Date  . Chronic pancreatitis (HCC)   . Depression   . GERD (gastroesophageal reflux disease)   . Medical history non-contributory      Social History  Socioeconomic History  . Marital status: Single    Spouse name: Not on file  . Number of children: Not on file  . Years of education: Not on file  . Highest education level: Not on file  Occupational History  . Not on file  Tobacco Use  . Smoking status: Current Some Day Smoker    Packs/day: 0.30    Years: 30.00    Pack years: 9.00    Types: Cigarettes  . Smokeless tobacco: Never Used  Substance and Sexual Activity  . Alcohol use: No  .  Drug use: No  . Sexual activity: Never    Birth control/protection: None  Other Topics Concern  . Not on file  Social History Narrative   Single.   Moved from [place].   Works at [employer].   Enjoys spending time with family.   Social Determinants of Health   Financial Resource Strain: Not on file  Food Insecurity: Not on file  Transportation Needs: Not on file  Physical Activity: Not on file  Stress: Not on file  Social Connections: Not on file  Intimate Partner Violence: Not on file    Past Surgical History:  Procedure Laterality Date  . BREAST REDUCTION SURGERY    . CESAREAN SECTION    . IR US GUIDE BX ASP/DRAIN  01/09/2020  . SHOULDER SURGERY Left     Family History  Problem Relation Age of Onset  . Diabetes Mother   . Kidney failure Mother   . Hypertension Mother   . Coronary artery disease Mother   . Heart disease Mother   . Lung cancer Father     No Known Allergies  Current Outpatient Medications on File Prior to Visit  Medication Sig Dispense Refill  . acetaminophen (TYLENOL) 500 MG tablet Take 1,000-1,500 mg by mouth daily as needed for mild pain.    Marland Kitchen buPROPion (WELLBUTRIN SR) 150 MG 12 hr tablet TAKE 1 TABLET (150 MG TOTAL) BY MOUTH 2 (TWO) TIMES DAILY. FOR DEPRESSION. 60 tablet 0  . famotidine (PEPCID) 20 MG tablet TAKE 1 TABLET BY MOUTH ONCE TO TWICE DAILY FOR HEARTBURN. (Patient taking differently: Take 20 mg by mouth 2 (two) times daily as needed for heartburn.) 180 tablet 0  . gabapentin (NEURONTIN) 300 MG capsule Take 2 capsules (600 mg total) by mouth 2 (two) times daily. As needed for pain. (Patient taking differently: Take 600 mg by mouth 2 (two) times daily as needed (Pain).) 360 capsule 0  . Multiple Vitamins-Minerals (CENTRUM SILVER ADULT 50+) TABS Take 1 tablet by mouth daily.    . ondansetron (ZOFRAN) 4 MG tablet TAKE 1 TABLET BY MOUTH EVERY 6 HOURS AS NEEDED FOR NAUSEA AND VOMITING. QTY/DAY SUPPLY PER INSURANCE 18 tablet 0  . Pancrelipase,  Lip-Prot-Amyl, (CREON) 24000-76000 units CPEP TAKE 2 CAPSULE WITH MEALS ( 1 AFTER BEGINNING,1 WHEN FINISHED EATING). TAKE 1 CAPSULE WITH SNACK (IMMEDIATELY AFTER BEGINNING) TWICE DAILY. (Patient taking differently: Take 1-2 capsules by mouth See admin instructions. Take 2 capsules with a meal and 1 capsule with a snack.Marland Kitchen) 240 capsule 2   No current facility-administered medications on file prior to visit.    Ht 5\' 4"  (1.626 m)   Wt 151 lb (68.5 kg)   BMI 25.92 kg/m    Objective:   Physical Exam Eyes:     Comments: Right upper and lower lids with moderate swelling and erythema.  Unable to evaluate sclera.  No evidence of drainage from picture.  Pulmonary:  Effort: Pulmonary effort is normal.  Neurological:     Mental Status: She is alert.            Assessment & Plan:

## 2022-11-21 ENCOUNTER — Other Ambulatory Visit (INDEPENDENT_AMBULATORY_CARE_PROVIDER_SITE_OTHER): Payer: PRIVATE HEALTH INSURANCE

## 2022-11-21 ENCOUNTER — Encounter: Payer: Self-pay | Admitting: Primary Care

## 2022-11-21 ENCOUNTER — Ambulatory Visit (INDEPENDENT_AMBULATORY_CARE_PROVIDER_SITE_OTHER): Payer: PRIVATE HEALTH INSURANCE | Admitting: Primary Care

## 2022-11-21 VITALS — BP 122/74 | HR 65 | Temp 97.6°F | Ht 64.0 in | Wt 179.0 lb

## 2022-11-21 DIAGNOSIS — Z1159 Encounter for screening for other viral diseases: Secondary | ICD-10-CM

## 2022-11-21 DIAGNOSIS — R7303 Prediabetes: Secondary | ICD-10-CM | POA: Diagnosis not present

## 2022-11-21 DIAGNOSIS — K219 Gastro-esophageal reflux disease without esophagitis: Secondary | ICD-10-CM | POA: Diagnosis not present

## 2022-11-21 DIAGNOSIS — K861 Other chronic pancreatitis: Secondary | ICD-10-CM | POA: Diagnosis not present

## 2022-11-21 DIAGNOSIS — E7800 Pure hypercholesterolemia, unspecified: Secondary | ICD-10-CM

## 2022-11-21 DIAGNOSIS — R7989 Other specified abnormal findings of blood chemistry: Secondary | ICD-10-CM

## 2022-11-21 DIAGNOSIS — Z0001 Encounter for general adult medical examination with abnormal findings: Secondary | ICD-10-CM

## 2022-11-21 DIAGNOSIS — Z1231 Encounter for screening mammogram for malignant neoplasm of breast: Secondary | ICD-10-CM

## 2022-11-21 DIAGNOSIS — F331 Major depressive disorder, recurrent, moderate: Secondary | ICD-10-CM

## 2022-11-21 LAB — CBC
HCT: 42.9 % (ref 36.0–46.0)
Hemoglobin: 14.4 g/dL (ref 12.0–15.0)
MCHC: 33.6 g/dL (ref 30.0–36.0)
MCV: 90 fl (ref 78.0–100.0)
Platelets: 223 10*3/uL (ref 150.0–400.0)
RBC: 4.77 Mil/uL (ref 3.87–5.11)
RDW: 13.3 % (ref 11.5–15.5)
WBC: 4.8 10*3/uL (ref 4.0–10.5)

## 2022-11-21 LAB — COMPREHENSIVE METABOLIC PANEL
ALT: 16 U/L (ref 0–35)
AST: 13 U/L (ref 0–37)
Albumin: 4.3 g/dL (ref 3.5–5.2)
Alkaline Phosphatase: 121 U/L — ABNORMAL HIGH (ref 39–117)
BUN: 9 mg/dL (ref 6–23)
CO2: 27 mEq/L (ref 19–32)
Calcium: 10.1 mg/dL (ref 8.4–10.5)
Chloride: 106 mEq/L (ref 96–112)
Creatinine, Ser: 0.77 mg/dL (ref 0.40–1.20)
GFR: 84.77 mL/min (ref >60.00)
Glucose, Bld: 96 mg/dL (ref 70–99)
Potassium: 3.9 mEq/L (ref 3.5–5.1)
Sodium: 141 mEq/L (ref 135–145)
Total Bilirubin: 0.5 mg/dL (ref 0.2–1.2)
Total Protein: 6.8 g/dL (ref 6.0–8.3)

## 2022-11-21 LAB — LIPID PANEL
Cholesterol: 238 mg/dL — ABNORMAL HIGH (ref 0–200)
HDL: 53.5 mg/dL (ref >39.00)
LDL Cholesterol: 156 mg/dL — ABNORMAL HIGH (ref 0–99)
NonHDL: 184.77
Total CHOL/HDL Ratio: 4
Triglycerides: 143 mg/dL (ref 0.0–149.0)
VLDL: 28.6 mg/dL (ref 0.0–40.0)

## 2022-11-21 LAB — HEMOGLOBIN A1C: Hgb A1c MFr Bld: 6.2 % (ref 4.6–6.5)

## 2022-11-21 LAB — TSH: TSH: 0.29 u[IU]/mL — ABNORMAL LOW (ref 0.35–5.50)

## 2022-11-21 LAB — LIPASE: Lipase: 69 U/L — ABNORMAL HIGH (ref 11.0–59.0)

## 2022-11-21 MED ORDER — DULOXETINE HCL 20 MG PO CPEP
20.0000 mg | ORAL_CAPSULE | Freq: Every day | ORAL | 0 refills | Status: DC
Start: 2022-11-21 — End: 2023-01-02

## 2022-11-21 MED ORDER — FAMOTIDINE 20 MG PO TABS
20.0000 mg | ORAL_TABLET | Freq: Every day | ORAL | 3 refills | Status: DC
Start: 2022-11-21 — End: 2023-12-17

## 2022-11-21 NOTE — Assessment & Plan Note (Signed)
Repeat A1C pending.  Discussed the importance of a healthy diet and regular exercise in order for weight loss, and to reduce the risk of further co-morbidity.  

## 2022-11-21 NOTE — Progress Notes (Signed)
Subjective:    Patient ID: Anita Poole, female    DOB: Dec 03, 1965, 57 y.o.   MRN: [id number]  HPI  Anita Poole is a very pleasant 57 y.o. female who presents today for complete physical and follow up of chronic conditions.  She would also like to mention chronic depression. Chronic for the last 1 year. Symptoms include fatigue, sleep disturbance, irritability, little motivation to do anything. She has good days and bad days. She is interested in treatment. She has never undergone treatment for depression.  Immunizations: -Tetanus: Declines  -Shingles: Never completed, declines   Diet: Fair diet.  Exercise: No regular exercise.  Eye exam: Completed several years ago  Dental exam: Completed several years ago  Pap Smear: Completed in 2018 Mammogram: Never completed   Colonoscopy: Completed in 2018, unsure when due, she thinks maybe 10 years.   BP Readings from Last 3 Encounters:  11/21/22 122/74  03/28/20 (!) 147/90  02/27/20 (!) 149/93      Review of Systems  Constitutional:  Negative for unexpected weight change.  HENT:  Negative for rhinorrhea.   Eyes:  Negative for visual disturbance.  Respiratory:  Negative for cough and shortness of breath.   Cardiovascular:  Negative for chest pain.  Gastrointestinal:  Positive for abdominal pain. Negative for constipation and diarrhea.  Genitourinary:  Negative for difficulty urinating.  Musculoskeletal:  Negative for arthralgias.  Skin:  Negative for rash.  Allergic/Immunologic: Negative for environmental allergies.  Neurological:  Negative for dizziness and headaches.  Psychiatric/Behavioral:  The patient is nervous/anxious.        See HPI         Past Medical History:  Diagnosis Date   Chronic pancreatitis (HCC)    Depression    GERD (gastroesophageal reflux disease)    Medical history non-contributory     Social History   Socioeconomic History   Marital status: Single    Spouse name: Not on file   Number  of children: Not on file   Years of education: Not on file   Highest education level: Not on file  Occupational History   Not on file  Tobacco Use   Smoking status: Some Days    Packs/day: 0.30    Years: 30.00    Additional pack years: 0.00    Total pack years: 9.00    Types: Cigarettes   Smokeless tobacco: Never  Substance and Sexual Activity   Alcohol use: No   Drug use: No   Sexual activity: Never    Birth control/protection: None  Other Topics Concern   Not on file  Social History Narrative   Single.   Moved from [place].   Works at [employer].   Enjoys spending time with family.   Social Determinants of Health   Financial Resource Strain: Not on file  Food Insecurity: Not on file  Transportation Needs: Not on file  Physical Activity: Not on file  Stress: Not on file  Social Connections: Not on file  Intimate Partner Violence: Not on file    Past Surgical History:  Procedure Laterality Date   BREAST REDUCTION SURGERY     CESAREAN SECTION     IR US GUIDE BX ASP/DRAIN  01/09/2020   SHOULDER SURGERY Left     Family History  Problem Relation Age of Onset   Diabetes Mother    Kidney failure Mother    Hypertension Mother    Coronary artery disease Mother    Heart disease Mother    Lung  cancer Father     No Known Allergies  Current Outpatient Medications on File Prior to Visit  Medication Sig Dispense Refill   acetaminophen (TYLENOL) 500 MG tablet Take 1,000-1,500 mg by mouth daily as needed for mild pain.     Multiple Vitamins-Minerals (CENTRUM SILVER ADULT 50+) TABS Take 1 tablet by mouth daily.     No current facility-administered medications on file prior to visit.    BP 122/74   Pulse 65   Temp 97.6 F (36.4 C) (Temporal)   Ht 5\' 4"  (1.626 m)   Wt 179 lb (81.2 kg)   SpO2 99%   BMI 30.73 kg/m  Objective:   Physical Exam HENT:     Right Ear: Tympanic membrane and ear canal normal.     Left Ear: Tympanic membrane and ear canal normal.      Nose: Nose normal.  Eyes:     Conjunctiva/sclera: Conjunctivae normal.     Pupils: Pupils are equal, round, and reactive to light.  Neck:     Thyroid: No thyromegaly.  Cardiovascular:     Rate and Rhythm: Normal rate and regular rhythm.     Heart sounds: No murmur heard. Pulmonary:     Effort: Pulmonary effort is normal.     Breath sounds: Normal breath sounds. No rales.  Abdominal:     General: Bowel sounds are normal.     Palpations: Abdomen is soft.     Tenderness: There is abdominal tenderness.  Musculoskeletal:        General: Normal range of motion.     Cervical back: Neck supple.  Lymphadenopathy:     Cervical: No cervical adenopathy.  Skin:    General: Skin is warm and dry.     Findings: No rash.  Neurological:     Mental Status: She is alert and oriented to person, place, and time.     Cranial Nerves: No cranial nerve deficit.     Deep Tendon Reflexes: Reflexes are normal and symmetric.  Psychiatric:        Mood and Affect: Mood normal.           Assessment & Plan:  Encounter for annual general medical examination with abnormal findings in adult Assessment & Plan: Declines tetanus and Shingrix vaccines.  Pap smear due, she declines today Mammogram due, orders placed. Colonoscopy UTD, due 2028 per patient  Discussed the importance of a healthy diet and regular exercise in order for weight loss, and to reduce the risk of further co-morbidity.  Exam stable. Labs pending.  Follow up in 1 year for repeat physical.    Other chronic pancreatitis (HCC) Assessment & Plan: Continued pain.   No longer on Creon, no longer following with GI. She declines referral to GI now.     Orders: -     Lipase  Gastroesophageal reflux disease, unspecified whether esophagitis present Assessment & Plan: Chronic and continued.  Start famotidine 20 mg daily.  She will update.   Orders: -     Famotidine; Take 1 tablet (20 mg total) by mouth daily. for heartburn.   Dispense: 90 tablet; Refill: 3  Prediabetes Assessment & Plan: Repeat A1C pending.  Discussed the importance of a healthy diet and regular exercise in order for weight loss, and to reduce the risk of further co-morbidity.   Orders: -     Hemoglobin A1c -     CBC -     TSH  Pure hypercholesterolemia Assessment & Plan: Repeat lipid panel pending.  Add lipo protein A given family history of heart disease.   Orders: -     Lipid panel -     Comprehensive metabolic panel -     TSH -     Lipoprotein A (LPA)  Moderate episode of recurrent major depressive disorder (HCC) Assessment & Plan: Uncontrolled.  Discussed options for treatment. She opts for both therapy and medication.  Start Cymbalta 20 mg daily. Referral placed for therapy.   We discussed possible side effects of headache, GI upset, drowsiness, and SI/HI.  Follow up in 6 weeks for re-evaluation.    Orders: -     Ambulatory referral to Psychology -     DULoxetine HCl; Take 1 capsule (20 mg total) by mouth daily. For depression and pain  Dispense: 90 capsule; Refill: 0  Encounter for hepatitis C screening test for low risk patient -     Hepatitis C antibody  Screening mammogram for breast cancer -     3D Screening Mammogram, Left and Right; Future        Doreene Nest, NP

## 2022-11-21 NOTE — Assessment & Plan Note (Signed)
Chronic and continued.  Start famotidine 20 mg daily.  She will update.

## 2022-11-21 NOTE — Assessment & Plan Note (Signed)
Continued pain.   No longer on Creon, no longer following with GI. She declines referral to GI now.

## 2022-11-21 NOTE — Assessment & Plan Note (Signed)
Uncontrolled.  Discussed options for treatment. She opts for both therapy and medication.  Start Cymbalta 20 mg daily. Referral placed for therapy.   We discussed possible side effects of headache, GI upset, drowsiness, and SI/HI.  Follow up in 6 weeks for re-evaluation.

## 2022-11-21 NOTE — Assessment & Plan Note (Signed)
Repeat lipid panel pending. Add lipo protein A given family history of heart disease.

## 2022-11-21 NOTE — Assessment & Plan Note (Signed)
Declines tetanus and Shingrix vaccines.  Pap smear due, she declines today Mammogram due, orders placed. Colonoscopy UTD, due 2028 per patient  Discussed the importance of a healthy diet and regular exercise in order for weight loss, and to reduce the risk of further co-morbidity.  Exam stable. Labs pending.  Follow up in 1 year for repeat physical.

## 2022-11-21 NOTE — Patient Instructions (Addendum)
Start duloxetine (Cymbalta) 20 mg daily for depression and pain.  Call to schedule your mammogram.   You will either be contacted via phone regarding your referral to therapy, or you may receive a letter on your MyChart portal from our referral team with instructions for scheduling an appointment. Please let us know if you have not been contacted by anyone within two weeks.  Please schedule a follow up visit for 6 weeks for follow up of anxiety/depression.  It was a pleasure to see you today!

## 2022-11-24 LAB — T4, FREE: Free T4: 0.99 ng/dL (ref 0.60–1.60)

## 2022-11-27 LAB — LIPOPROTEIN A (LPA): Lipoprotein (a): 448 nmol/L — ABNORMAL HIGH (ref <75)

## 2022-11-27 LAB — HEPATITIS C ANTIBODY: Hepatitis C Ab: NONREACTIVE

## 2022-11-28 DIAGNOSIS — E7800 Pure hypercholesterolemia, unspecified: Secondary | ICD-10-CM

## 2022-12-02 MED ORDER — ROSUVASTATIN CALCIUM 5 MG PO TABS
5.0000 mg | ORAL_TABLET | Freq: Every day | ORAL | 0 refills | Status: DC
Start: 2022-12-02 — End: 2023-01-02

## 2023-01-02 ENCOUNTER — Encounter: Payer: Self-pay | Admitting: Primary Care

## 2023-01-02 ENCOUNTER — Ambulatory Visit (INDEPENDENT_AMBULATORY_CARE_PROVIDER_SITE_OTHER): Payer: Self-pay | Admitting: Primary Care

## 2023-01-02 VITALS — BP 128/76 | HR 60 | Temp 98.2°F | Ht 64.0 in | Wt 179.0 lb

## 2023-01-02 DIAGNOSIS — F331 Major depressive disorder, recurrent, moderate: Secondary | ICD-10-CM

## 2023-01-02 DIAGNOSIS — E7800 Pure hypercholesterolemia, unspecified: Secondary | ICD-10-CM

## 2023-01-02 MED ORDER — DULOXETINE HCL 20 MG PO CPEP
40.0000 mg | ORAL_CAPSULE | Freq: Every day | ORAL | 0 refills | Status: DC
Start: 2023-01-02 — End: 2023-03-31

## 2023-01-02 MED ORDER — PRAVASTATIN SODIUM 40 MG PO TABS
40.0000 mg | ORAL_TABLET | Freq: Every day | ORAL | 0 refills | Status: DC
Start: 2023-01-02 — End: 2023-01-26

## 2023-01-02 NOTE — Assessment & Plan Note (Signed)
Positive lipoprotein a.  Unfortunately, side effects with rosuvastatin 5 mg. Discontinue rosuvastatin 5 mg.  Start pravastatin 40 mg daily. Follow-up in 1 month.

## 2023-01-02 NOTE — Patient Instructions (Signed)
Stop taking rosuvastatin 5 mg for cholesterol.  In 1 week start pravastatin 40 mg once daily for cholesterol.  We increased the dose of your Cymbalta to 40 mg daily.  Start taking 2 of the 20 mg capsules every day.  Schedule a follow-up visit for 1 month.  It was a pleasure to see you today!

## 2023-01-02 NOTE — Assessment & Plan Note (Signed)
No change since initiation of Cymbalta.  Increase Cymbalta to 40 mg daily. She will work on getting in with therapy.  Follow up in 1 month.

## 2023-01-02 NOTE — Progress Notes (Signed)
Subjective:    Patient ID: Anita Poole, female    DOB: Feb 27, 1966, 57 y.o.   MRN: [id number]  HPI  Anita Poole is a very pleasant 57 y.o. female with a history of chronic pancreatitis, prediabetes, tobacco use, hyperlipidemia, depression who presents today for follow-up of depression and hyperlipidemia.   1) Depression: She was last evaluated on 11/21/2022 for her annual physical.  During this visit she mentioned symptoms of chronic depression including fatigue, sleep disturbance, little motivation to do anything, irritability.  Given her symptoms we referred her to therapy and initiated Cymbalta 20 mg daily.  She is here today for follow-up.  Since her last visit she is waiting to hear from her insurance company regarding therapy. She cannot notice a difference in her symptoms since initiation of Cymbalta. She continues to feel little motivation to do anything, irritable, down/sad. She is mostly frustrated by her weight gain since 2021. She's eating a lean diet for the last 1-2 months and has started exercising a few times weekly.   2) Hyperlipidemia: Currently managed on rosuvastatin 5 mg which was initiated 1 month ago for hyperlipidemia and lipoprotein a level of 448.  She has a significant family history of heart disease and multiple direct family members.  Since initiation of rosuvastatin she has noted lower extremity cramping, particularly at night.  She has to lay in bed with heating pads.  She has never tried other statin medications  Wt Readings from Last 3 Encounters:  01/02/23 179 lb (81.2 kg)  11/21/22 179 lb (81.2 kg)  07/02/20 151 lb (68.5 kg)     Review of Systems  Cardiovascular:  Negative for chest pain.  Gastrointestinal:  Negative for nausea.  Musculoskeletal:  Positive for myalgias.  Neurological:  Negative for headaches.  Psychiatric/Behavioral:  Negative for suicidal ideas. The patient is nervous/anxious.        See HPI         Past Medical History:   Diagnosis Date   Chronic pancreatitis (HCC)    Depression    GERD (gastroesophageal reflux disease)    Medical history non-contributory     Social History   Socioeconomic History   Marital status: Single    Spouse name: Not on file   Number of children: Not on file   Years of education: Not on file   Highest education level: Not on file  Occupational History   Not on file  Tobacco Use   Smoking status: Former    Packs/day: 0.30    Years: 30.00    Additional pack years: 0.00    Total pack years: 9.00    Types: Cigarettes   Smokeless tobacco: Never  Substance and Sexual Activity   Alcohol use: No   Drug use: No   Sexual activity: Never    Birth control/protection: None  Other Topics Concern   Not on file  Social History Narrative   Single.   Moved from [place].   Works at [employer].   Enjoys spending time with family.   Social Determinants of Health   Financial Resource Strain: Not on file  Food Insecurity: Not on file  Transportation Needs: Not on file  Physical Activity: Not on file  Stress: Not on file  Social Connections: Not on file  Intimate Partner Violence: Not on file    Past Surgical History:  Procedure Laterality Date   BREAST REDUCTION SURGERY     CESAREAN SECTION     IR US GUIDE BX ASP/DRAIN  01/09/2020  SHOULDER SURGERY Left     Family History  Problem Relation Age of Onset   Diabetes Mother    Kidney failure Mother    Hypertension Mother    Coronary artery disease Mother    Heart disease Mother    Lung cancer Father     No Known Allergies  Current Outpatient Medications on File Prior to Visit  Medication Sig Dispense Refill   acetaminophen (TYLENOL) 500 MG tablet Take 1,000-1,500 mg by mouth daily as needed for mild pain.     famotidine (PEPCID) 20 MG tablet Take 1 tablet (20 mg total) by mouth daily. for heartburn. 90 tablet 3   Multiple Vitamins-Minerals (CENTRUM SILVER ADULT 50+) TABS Take 1 tablet by mouth daily.     No  current facility-administered medications on file prior to visit.    BP 128/76   Pulse 60   Temp 98.2 F (36.8 C) (Temporal)   Ht 5\' 4"  (1.626 m)   Wt 179 lb (81.2 kg)   SpO2 94%   BMI 30.73 kg/m  Objective:   Physical Exam Cardiovascular:     Rate and Rhythm: Normal rate and regular rhythm.  Pulmonary:     Effort: Pulmonary effort is normal.     Breath sounds: Normal breath sounds.  Musculoskeletal:     Cervical back: Neck supple.  Skin:    General: Skin is warm and dry.           Assessment & Plan:  Pure hypercholesterolemia Assessment & Plan: Positive lipoprotein a.  Unfortunately, side effects with rosuvastatin 5 mg. Discontinue rosuvastatin 5 mg.  Start pravastatin 40 mg daily. Follow-up in 1 month.  Orders: -     Pravastatin Sodium; Take 1 tablet (40 mg total) by mouth daily. for cholesterol.  Dispense: 30 tablet; Refill: 0  Moderate episode of recurrent major depressive disorder (HCC) Assessment & Plan: No change since initiation of Cymbalta.  Increase Cymbalta to 40 mg daily. She will work on getting in with therapy.  Follow up in 1 month.  Orders: -     DULoxetine HCl; Take 2 capsules (40 mg total) by mouth daily. For depression and pain  Dispense: 180 capsule; Refill: 0        Doreene Nest, NP

## 2023-01-16 ENCOUNTER — Ambulatory Visit
Admission: RE | Admit: 2023-01-16 | Discharge: 2023-01-16 | Disposition: A | Discharge status: Home / Self Care | Payer: PRIVATE HEALTH INSURANCE | Source: Ambulatory Visit | Attending: Primary Care | Admitting: Primary Care

## 2023-01-16 DIAGNOSIS — Z1231 Encounter for screening mammogram for malignant neoplasm of breast: Secondary | ICD-10-CM

## 2023-01-26 ENCOUNTER — Other Ambulatory Visit: Payer: Self-pay | Admitting: Primary Care

## 2023-01-26 DIAGNOSIS — E7800 Pure hypercholesterolemia, unspecified: Secondary | ICD-10-CM

## 2023-01-28 ENCOUNTER — Other Ambulatory Visit (INDEPENDENT_AMBULATORY_CARE_PROVIDER_SITE_OTHER): Payer: PRIVATE HEALTH INSURANCE

## 2023-01-28 DIAGNOSIS — E7800 Pure hypercholesterolemia, unspecified: Secondary | ICD-10-CM | POA: Diagnosis not present

## 2023-01-28 LAB — LIPID PANEL
Cholesterol: 184 mg/dL (ref 0–200)
HDL: 52.6 mg/dL (ref >39.00)
LDL Cholesterol: 115 mg/dL — ABNORMAL HIGH (ref 0–99)
NonHDL: 131.51
Total CHOL/HDL Ratio: 4
Triglycerides: 84 mg/dL (ref 0.0–149.0)
VLDL: 16.8 mg/dL (ref 0.0–40.0)

## 2023-01-28 LAB — HEPATIC FUNCTION PANEL
ALT: 14 U/L (ref 0–35)
AST: 12 U/L (ref 0–37)
Albumin: 4.1 g/dL (ref 3.5–5.2)
Alkaline Phosphatase: 124 U/L — ABNORMAL HIGH (ref 39–117)
Bilirubin, Direct: 0.1 mg/dL (ref 0.0–0.3)
Total Bilirubin: 0.6 mg/dL (ref 0.2–1.2)
Total Protein: 6.6 g/dL (ref 6.0–8.3)

## 2023-01-28 NOTE — Addendum Note (Signed)
Addended by: Alvina Chou on: 01/28/2023 11:06 AM   Modules accepted: Orders

## 2023-02-01 LAB — LIPOPROTEIN A (LPA): Lipoprotein (a): 440 nmol/L — ABNORMAL HIGH (ref <75)

## 2023-02-03 ENCOUNTER — Ambulatory Visit: Payer: PRIVATE HEALTH INSURANCE | Admitting: Primary Care

## 2023-02-12 ENCOUNTER — Ambulatory Visit (INDEPENDENT_AMBULATORY_CARE_PROVIDER_SITE_OTHER): Payer: Managed Care, Other (non HMO) | Admitting: Primary Care

## 2023-02-12 ENCOUNTER — Other Ambulatory Visit: Payer: Self-pay | Admitting: Primary Care

## 2023-02-12 ENCOUNTER — Encounter: Payer: Self-pay | Admitting: Primary Care

## 2023-02-12 VITALS — BP 122/76 | HR 55 | Temp 98.2°F | Ht 64.0 in | Wt 179.0 lb

## 2023-02-12 DIAGNOSIS — F331 Major depressive disorder, recurrent, moderate: Secondary | ICD-10-CM

## 2023-02-12 DIAGNOSIS — K5909 Other constipation: Secondary | ICD-10-CM | POA: Diagnosis not present

## 2023-02-12 NOTE — Patient Instructions (Signed)
Try MiraLAX daily or every other day for constipation.  Make sure to drink at least 8 ounces of water/liquid with the MiraLAX.  Continue Cymbalta 40 mg daily.  Try to connect with your insurance company regarding therapy sessions.  It was a pleasure to see you today!

## 2023-02-12 NOTE — Assessment & Plan Note (Signed)
Improving.  Offered to increase her dose of Cymbalta to 60 mg, she declines for now. She will work on getting in Aeronautical engineer with her insurance company to see if they will cover therapy sessions.  Continue Cymbalta 40 mg daily. Continue to monitor.

## 2023-02-12 NOTE — Assessment & Plan Note (Signed)
Continued.  Start MiraLAX daily to every other day. She will update.

## 2023-02-12 NOTE — Progress Notes (Signed)
Subjective:    Patient ID: Anita Poole, female    DOB: 09-26-1965, 57 y.o.   MRN: [id number]  HPI  Anita Poole is a very pleasant 57 y.o. female with a history of chronic pancreatitis, GERD, chronic constipation, MDD, hyperlipidemia, prediabetes who presents today for follow-up of depression.  Previously evaluated on 01/02/2023 for follow-up of depression and hyperlipidemia.  During this visit she felt no improvement in her symptoms of depression since initiation of Cymbalta 20 mg daily.  She was working to get in with therapy.  She continues to experience symptoms of irritability, feeling down/sad, little motivation to do anything.  During this visit we increased her Cymbalta to 40 mg daily.  Since her last visit she's noticed an improvement in her symptoms. Positive effects include reduced irritability and anxiousness. Her motivation has improved some, still continues to have low motivation. She has yet to connect with therapy, has to contact her insurance company.   She has noticed increased constipation, is not sure if this is from duloxetine or not. She's tried Metamucil daily and Colace without much improvement. She denies nausea, vomiting, SI/HI.    Review of Systems  Gastrointestinal:  Positive for constipation. Negative for nausea and vomiting.  Psychiatric/Behavioral:  The patient is nervous/anxious.        See HPI         Past Medical History:  Diagnosis Date   Chronic pancreatitis (HCC)    Depression    GERD (gastroesophageal reflux disease)    Medical history non-contributory     Social History   Socioeconomic History   Marital status: Single    Spouse name: Not on file   Number of children: Not on file   Years of education: Not on file   Highest education level: Not on file  Occupational History   Not on file  Tobacco Use   Smoking status: Former    Current packs/day: 0.30    Average packs/day: 0.3 packs/day for 30.0 years (9.0 ttl pk-yrs)    Types:  Cigarettes   Smokeless tobacco: Never  Substance and Sexual Activity   Alcohol use: No   Drug use: No   Sexual activity: Never    Birth control/protection: None  Other Topics Concern   Not on file  Social History Narrative   Single.   Moved from [place].   Works at [employer].   Enjoys spending time with family.   Social Determinants of Health   Financial Resource Strain: Not on file  Food Insecurity: Not on file  Transportation Needs: Not on file  Physical Activity: Not on file  Stress: Not on file  Social Connections: Not on file  Intimate Partner Violence: Not on file    Past Surgical History:  Procedure Laterality Date   BREAST REDUCTION SURGERY     CESAREAN SECTION     IR US GUIDE BX ASP/DRAIN  01/09/2020   REDUCTION MAMMAPLASTY     SHOULDER SURGERY Left     Family History  Problem Relation Age of Onset   Diabetes Mother    Kidney failure Mother    Hypertension Mother    Coronary artery disease Mother    Heart disease Mother    Lung cancer Father    Breast cancer Neg Hx     No Known Allergies  Current Outpatient Medications on File Prior to Visit  Medication Sig Dispense Refill   acetaminophen (TYLENOL) 500 MG tablet Take 1,000-1,500 mg by mouth daily as needed for mild pain.  DULoxetine (CYMBALTA) 20 MG capsule Take 2 capsules (40 mg total) by mouth daily. For depression and pain 180 capsule 0   famotidine (PEPCID) 20 MG tablet Take 1 tablet (20 mg total) by mouth daily. for heartburn. 90 tablet 3   Multiple Vitamins-Minerals (CENTRUM SILVER ADULT 50+) TABS Take 1 tablet by mouth daily.     pravastatin (PRAVACHOL) 40 MG tablet TAKE 1 TABLET BY MOUTH EVERY DAY FOR CHOLESTEROL 90 tablet 0   No current facility-administered medications on file prior to visit.    BP 122/76   Pulse (!) 55   Temp 98.2 F (36.8 C) (Temporal)   Ht 5\' 4"  (1.626 m)   Wt 179 lb (81.2 kg)   SpO2 98%   BMI 30.73 kg/m  Objective:   Physical Exam Cardiovascular:      Rate and Rhythm: Normal rate and regular rhythm.  Pulmonary:     Effort: Pulmonary effort is normal.     Breath sounds: Normal breath sounds.  Musculoskeletal:     Cervical back: Neck supple.  Skin:    General: Skin is warm and dry.           Assessment & Plan:  Chronic constipation Assessment & Plan: Continued.  Start MiraLAX daily to every other day. She will update.   Moderate episode of recurrent major depressive disorder [facility]) Assessment & Plan: Improving.  Offered to increase her dose of Cymbalta to 60 mg, she declines for now. She will work on getting in Aeronautical engineer with her insurance company to see if they will cover therapy sessions.  Continue Cymbalta 40 mg daily. Continue to monitor.         Doreene Nest, NP

## 2023-03-31 ENCOUNTER — Other Ambulatory Visit: Payer: Self-pay | Admitting: Primary Care

## 2023-03-31 DIAGNOSIS — F331 Major depressive disorder, recurrent, moderate: Secondary | ICD-10-CM

## 2023-04-23 ENCOUNTER — Other Ambulatory Visit: Payer: Self-pay | Admitting: Primary Care

## 2023-04-23 DIAGNOSIS — E7800 Pure hypercholesterolemia, unspecified: Secondary | ICD-10-CM

## 2023-09-21 ENCOUNTER — Other Ambulatory Visit: Payer: Self-pay | Admitting: Primary Care

## 2023-09-21 DIAGNOSIS — F331 Major depressive disorder, recurrent, moderate: Secondary | ICD-10-CM

## 2023-09-21 NOTE — Telephone Encounter (Signed)
 Patient is due for CPE/follow up in mid May, this will be required prior to any further refills.  Please schedule, thank you!

## 2023-09-22 NOTE — Telephone Encounter (Signed)
 Spoke to pt, scheduled cpe for 11/24/23

## 2023-11-20 ENCOUNTER — Encounter

## 2023-11-24 ENCOUNTER — Encounter: Payer: Self-pay | Admitting: Primary Care

## 2023-11-24 ENCOUNTER — Ambulatory Visit (INDEPENDENT_AMBULATORY_CARE_PROVIDER_SITE_OTHER): Admitting: Primary Care

## 2023-11-24 VITALS — BP 140/80 | HR 64 | Temp 97.8°F | Ht 63.75 in | Wt 174.0 lb

## 2023-11-24 DIAGNOSIS — K219 Gastro-esophageal reflux disease without esophagitis: Secondary | ICD-10-CM | POA: Diagnosis not present

## 2023-11-24 DIAGNOSIS — F331 Major depressive disorder, recurrent, moderate: Secondary | ICD-10-CM

## 2023-11-24 DIAGNOSIS — E7800 Pure hypercholesterolemia, unspecified: Secondary | ICD-10-CM | POA: Diagnosis not present

## 2023-11-24 DIAGNOSIS — Z0000 Encounter for general adult medical examination without abnormal findings: Secondary | ICD-10-CM

## 2023-11-24 DIAGNOSIS — Z1231 Encounter for screening mammogram for malignant neoplasm of breast: Secondary | ICD-10-CM

## 2023-11-24 DIAGNOSIS — K861 Other chronic pancreatitis: Secondary | ICD-10-CM

## 2023-11-24 DIAGNOSIS — Z124 Encounter for screening for malignant neoplasm of cervix: Secondary | ICD-10-CM | POA: Diagnosis not present

## 2023-11-24 DIAGNOSIS — R7303 Prediabetes: Secondary | ICD-10-CM

## 2023-11-24 LAB — COMPREHENSIVE METABOLIC PANEL WITH GFR
ALT: 16 U/L (ref 0–35)
AST: 12 U/L (ref 0–37)
Albumin: 4.4 g/dL (ref 3.5–5.2)
Alkaline Phosphatase: 125 U/L — ABNORMAL HIGH (ref 39–117)
BUN: 14 mg/dL (ref 6–23)
CO2: 26 meq/L (ref 19–32)
Calcium: 9.9 mg/dL (ref 8.4–10.5)
Chloride: 107 meq/L (ref 96–112)
Creatinine, Ser: 0.69 mg/dL (ref 0.40–1.20)
GFR: 94.7 mL/min (ref >60.00)
Glucose, Bld: 100 mg/dL — ABNORMAL HIGH (ref 70–99)
Potassium: 4 meq/L (ref 3.5–5.1)
Sodium: 139 meq/L (ref 135–145)
Total Bilirubin: 0.5 mg/dL (ref 0.2–1.2)
Total Protein: 7.1 g/dL (ref 6.0–8.3)

## 2023-11-24 LAB — LIPID PANEL
Cholesterol: 233 mg/dL — ABNORMAL HIGH (ref 0–200)
HDL: 42.3 mg/dL (ref >39.00)
LDL Cholesterol: 167 mg/dL — ABNORMAL HIGH (ref 0–99)
NonHDL: 190.83
Total CHOL/HDL Ratio: 6
Triglycerides: 121 mg/dL (ref 0.0–149.0)
VLDL: 24.2 mg/dL (ref 0.0–40.0)

## 2023-11-24 LAB — HEMOGLOBIN A1C: Hgb A1c MFr Bld: 6.3 % (ref 4.6–6.5)

## 2023-11-24 LAB — LIPASE: Lipase: 88 U/L — ABNORMAL HIGH (ref 11.0–59.0)

## 2023-11-24 NOTE — Assessment & Plan Note (Signed)
 Declines all immunizations.  Pap smear overdue, she declines today but will reschedule. Mammogram due in July, orders placed. Colonoscopy UTD, due 2028  Discussed the importance of a healthy diet and regular exercise in order for weight loss, and to reduce the risk of further co-morbidity.  Exam stable. Labs pending.  Follow up in 1 year for repeat physical.

## 2023-11-24 NOTE — Assessment & Plan Note (Signed)
 Deteriorated.   Increase famotidine  20 mg BID.  New Rx provided.

## 2023-11-24 NOTE — Assessment & Plan Note (Signed)
Commended her on weight loss!  Repeat A1C pending. 

## 2023-11-24 NOTE — Assessment & Plan Note (Addendum)
 Repeat lipid panel pending. She requests repeat LPA.   She stopped pravastatin  two months ago due to dietary changes. Discussed protective effects of statin therapy, especially given LPA and medical history. Remain off pravastatin  for now per patient request.

## 2023-11-24 NOTE — Assessment & Plan Note (Signed)
Controlled.  Continue duloxetine 40 mg daily.

## 2023-11-24 NOTE — Assessment & Plan Note (Signed)
 Infrequent flares overall, except for recent flare last night.  Repeat lipase ordered.

## 2023-11-24 NOTE — Patient Instructions (Signed)
 Complete your mammogram as scheduled.  Stop by the lab prior to leaving today. I will notify you of your results once received.   Be sure to schedule your Pap smear.  It was a pleasure to see you today!

## 2023-11-24 NOTE — Progress Notes (Signed)
 Subjective:    Patient ID: Anita Poole, female    DOB: 06-Nov-1965, 58 y.o.   MRN: [id number]  HPI  Anita Poole is a very pleasant 58 y.o. female who presents today for complete physical and follow up of chronic conditions.  Immunizations: -Tetanus: Due, declines   -Shingles: Never completed, declines    Diet: Significant improvement in diet over last 2 months.  Exercise: No regular exercise.  Eye exam: Completed > 1 year ago.  Dental exam: Completed years ago   Pap Smear: Completed in 2018, due today but declines  Mammogram: Completed in July 2024  Colonoscopy: Completed in 2018, due 2028 per patient  BP Readings from Last 3 Encounters:  11/24/23 (!) 140/80  02/12/23 122/76  01/02/23 128/76    Wt Readings from Last 3 Encounters:  11/24/23 174 lb (78.9 kg)  02/12/23 179 lb (81.2 kg)  01/02/23 179 lb (81.2 kg)       Review of Systems  Constitutional:  Negative for unexpected weight change.  HENT:  Negative for rhinorrhea.   Respiratory:  Negative for cough and shortness of breath.   Cardiovascular:  Negative for chest pain.  Gastrointestinal:  Negative for constipation and diarrhea.  Genitourinary:  Negative for difficulty urinating.  Musculoskeletal:  Negative for arthralgias and myalgias.  Skin:  Negative for rash.  Allergic/Immunologic: Negative for environmental allergies.  Neurological:  Negative for dizziness and headaches.  Psychiatric/Behavioral:  The patient is not nervous/anxious.          Past Medical History:  Diagnosis Date   Chronic pancreatitis (HCC)    Depression    GERD (gastroesophageal reflux disease)    Hepatic abscess 01/07/2020   Medical history non-contributory     Social History   Socioeconomic History   Marital status: Single    Spouse name: Not on file   Number of children: Not on file   Years of education: Not on file   Highest education level: Not on file  Occupational History   Not on file  Tobacco Use    Smoking status: Former    Current packs/day: 0.30    Average packs/day: 0.3 packs/day for 30.0 years (9.0 ttl pk-yrs)    Types: Cigarettes   Smokeless tobacco: Never  Substance and Sexual Activity   Alcohol use: No   Drug use: No   Sexual activity: Never    Birth control/protection: None  Other Topics Concern   Not on file  Social History Narrative   Single.   Moved from [place] .   Works at [employer].   Enjoys spending time with family.   Social Drivers of Corporate investment banker Strain: Not on file  Food Insecurity: Not on file  Transportation Needs: Not on file  Physical Activity: Not on file  Stress: Not on file  Social Connections: Not on file  Intimate Partner Violence: Not on file    Past Surgical History:  Procedure Laterality Date   BREAST REDUCTION SURGERY     CESAREAN SECTION     IR US  GUIDE BX ASP/DRAIN  01/09/2020   REDUCTION MAMMAPLASTY     SHOULDER SURGERY Left     Family History  Problem Relation Age of Onset   Diabetes Mother    Kidney failure Mother    Hypertension Mother    Coronary artery disease Mother    Heart disease Mother    Lung cancer Father    Breast cancer Neg Hx     No Known Allergies  Current  Outpatient Medications on File Prior to Visit  Medication Sig Dispense Refill   acetaminophen  (TYLENOL ) 500 MG tablet Take 1,000-1,500 mg by mouth daily as needed for mild pain.     DULoxetine  (CYMBALTA ) 20 MG capsule TAKE 2 CAPSULES BY MOUTH EVERY DAY FOR DEPRESSION AND PAIN 180 capsule 0   famotidine  (PEPCID ) 20 MG tablet Take 1 tablet (20 mg total) by mouth daily. for heartburn. 90 tablet 3   Multiple Vitamins-Minerals (CENTRUM SILVER ADULT 50+) TABS Take 1 tablet by mouth daily.     pravastatin  (PRAVACHOL ) 40 MG tablet TAKE 1 TABLET BY MOUTH EVERY DAY FOR CHOLESTEROL 90 tablet 1   No current facility-administered medications on file prior to visit.    BP (!) 140/80   Pulse 64   Temp 97.8 F (36.6 C) (Oral)   Ht 5' 3.75"  (1.619 m)   Wt 174 lb (78.9 kg)   SpO2 92%   BMI 30.10 kg/m  Objective:   Physical Exam HENT:     Right Ear: Tympanic membrane and ear canal normal.     Left Ear: Tympanic membrane and ear canal normal.  Eyes:     Pupils: Pupils are equal, round, and reactive to light.  Cardiovascular:     Rate and Rhythm: Normal rate and regular rhythm.  Pulmonary:     Effort: Pulmonary effort is normal.     Breath sounds: Normal breath sounds.  Abdominal:     General: Bowel sounds are normal.     Palpations: Abdomen is soft.     Tenderness: There is no abdominal tenderness.  Musculoskeletal:        General: Normal range of motion.     Cervical back: Neck supple.  Skin:    General: Skin is warm and dry.  Neurological:     Mental Status: She is alert and oriented to person, place, and time.     Cranial Nerves: No cranial nerve deficit.     Deep Tendon Reflexes:     Reflex Scores:      Patellar reflexes are 2+ on the right side and 2+ on the left side. Psychiatric:        Mood and Affect: Mood normal.           Assessment & Plan:  Preventative health care Assessment & Plan: Declines all immunizations.  Pap smear overdue, she declines today but will reschedule. Mammogram due in July, orders placed. Colonoscopy UTD, due 2028  Discussed the importance of a healthy diet and regular exercise in order for weight loss, and to reduce the risk of further co-morbidity.  Exam stable. Labs pending.  Follow up in 1 year for repeat physical.    Cervical cancer screening  Gastroesophageal reflux disease, unspecified whether esophagitis present Assessment & Plan: Deteriorated.   Increase famotidine  20 mg BID.  New Rx provided.    Pure hypercholesterolemia Assessment & Plan: Repeat lipid panel pending. She requests repeat LPA.   She stopped pravastatin  two months ago due to dietary changes. Discussed protective effects of statin therapy, especially given LPA and medical  history. Remain off pravastatin  for now per patient request.   Orders: -     Lipid panel -     Comprehensive metabolic panel with GFR -     Lipoprotein A (LPA)  Moderate episode of recurrent major depressive disorder (HCC) Assessment & Plan: Controlled.  Continue duloxetine  40 mg daily.    Prediabetes Assessment & Plan: Commended her on weight loss.  Repeat A1C pending.  Orders: -     Hemoglobin A1c  Other chronic pancreatitis (HCC) -     Lipase  Screening mammogram for breast cancer -     3D Screening Mammogram, Left and Right; Future        Gabriel John, NP

## 2023-11-25 ENCOUNTER — Encounter (HOSPITAL_COMMUNITY): Payer: Self-pay

## 2023-11-27 LAB — LIPOPROTEIN A (LPA): Lipoprotein (a): 398 nmol/L — ABNORMAL HIGH (ref <75)

## 2023-11-27 MED ORDER — EZETIMIBE 10 MG PO TABS: 10.0000 mg | ORAL_TABLET | Freq: Every day | ORAL | 3 refills | Status: AC

## 2023-12-02 ENCOUNTER — Ambulatory Visit (INDEPENDENT_AMBULATORY_CARE_PROVIDER_SITE_OTHER): Admitting: Primary Care

## 2023-12-02 ENCOUNTER — Other Ambulatory Visit (HOSPITAL_COMMUNITY)
Admission: RE | Admit: 2023-12-02 | Discharge: 2023-12-02 | Disposition: A | Discharge status: Home / Self Care | Source: Ambulatory Visit | Attending: Primary Care | Admitting: Primary Care

## 2023-12-02 ENCOUNTER — Encounter: Payer: Self-pay | Admitting: Primary Care

## 2023-12-02 VITALS — BP 142/76 | HR 63 | Temp 97.6°F | Ht 63.75 in | Wt 175.0 lb

## 2023-12-02 DIAGNOSIS — Z124 Encounter for screening for malignant neoplasm of cervix: Secondary | ICD-10-CM | POA: Diagnosis present

## 2023-12-02 DIAGNOSIS — E7800 Pure hypercholesterolemia, unspecified: Secondary | ICD-10-CM

## 2023-12-02 NOTE — Assessment & Plan Note (Signed)
 Pap smear completed today. Await results.

## 2023-12-02 NOTE — Patient Instructions (Signed)
 It was a pleasure to see you today!

## 2023-12-02 NOTE — Progress Notes (Signed)
 Subjective:    Patient ID: Anita Poole, female    DOB: 12-29-1965, 58 y.o.   MRN: [id number]  Gynecologic Exam The patient's pertinent negatives include no vaginal discharge. Pertinent negatives include no dysuria.    Anita Poole is a very pleasant 58 y.o. female with a history of chronic constipation, prediabetes, tobacco use, hyperlipidemia who presents today for pap smear and follow up for hyperlipidemia.  Last pap smear was in April of 2018, NILM.Aaron Aas she denies vaginal itching, discharge, bleeding. She has no concerns today.  History of hyperlipidemia with positive LPA. Most recent lipid panel with LDL of 167, LPA of 398. Previously managed on rosuvastatin  for which she discontinued as she doesn't prefer statins. She is now managed on Zetia 10 mg daily. She has also agreed to complete a coronary calcium  CT scan for which will be scheduled soon. She has a family history of heart disease in her mother and sister.    Review of Systems  Respiratory:  Negative for shortness of breath.   Cardiovascular:  Negative for chest pain.  Genitourinary:  Negative for dysuria, vaginal bleeding and vaginal discharge.         Past Medical History:  Diagnosis Date   Chronic pancreatitis (HCC)    Depression    GERD (gastroesophageal reflux disease)    Hepatic abscess 01/07/2020   Medical history non-contributory     Social History   Socioeconomic History   Marital status: Single    Spouse name: Not on file   Number of children: Not on file   Years of education: Not on file   Highest education level: Not on file  Occupational History   Not on file  Tobacco Use   Smoking status: Former    Current packs/day: 0.30    Average packs/day: 0.3 packs/day for 30.0 years (9.0 ttl pk-yrs)    Types: Cigarettes   Smokeless tobacco: Never  Substance and Sexual Activity   Alcohol use: No   Drug use: No   Sexual activity: Never    Birth control/protection: None  Other Topics Concern   Not  on file  Social History Narrative   Single.   Moved from [place] .   Works at [employer].   Enjoys spending time with family.   Social Drivers of Corporate investment banker Strain: Not on file  Food Insecurity: Not on file  Transportation Needs: Not on file  Physical Activity: Not on file  Stress: Not on file  Social Connections: Not on file  Intimate Partner Violence: Not on file    Past Surgical History:  Procedure Laterality Date   BREAST REDUCTION SURGERY     CESAREAN SECTION     IR US  GUIDE BX ASP/DRAIN  01/09/2020   REDUCTION MAMMAPLASTY     SHOULDER SURGERY Left     Family History  Problem Relation Age of Onset   Diabetes Mother    Kidney failure Mother    Hypertension Mother    Coronary artery disease Mother    Heart disease Mother    Lung cancer Father    Breast cancer Neg Hx     No Known Allergies  Current Outpatient Medications on File Prior to Visit  Medication Sig Dispense Refill   acetaminophen  (TYLENOL ) 500 MG tablet Take 1,000-1,500 mg by mouth daily as needed for mild pain.     DULoxetine  (CYMBALTA ) 20 MG capsule TAKE 2 CAPSULES BY MOUTH EVERY DAY FOR DEPRESSION AND PAIN 180 capsule 0   ezetimibe (ZETIA)  10 MG tablet Take 1 tablet (10 mg total) by mouth daily. for cholesterol. 90 tablet 3   famotidine  (PEPCID ) 20 MG tablet Take 1 tablet (20 mg total) by mouth daily. for heartburn. 90 tablet 3   Multiple Vitamins-Minerals (CENTRUM SILVER ADULT 50+) TABS Take 1 tablet by mouth daily.     pravastatin  (PRAVACHOL ) 40 MG tablet TAKE 1 TABLET BY MOUTH EVERY DAY FOR CHOLESTEROL (Patient not taking: Reported on 12/02/2023) 90 tablet 1   No current facility-administered medications on file prior to visit.    BP (!) 142/76   Pulse 63   Temp 97.6 F (36.4 C) (Temporal)   Ht 5' 3.75" (1.619 m)   Wt 175 lb (79.4 kg)   SpO2 96%   BMI 30.27 kg/m  Objective:   Physical Exam Exam conducted with a chaperone present.  Cardiovascular:     Rate and Rhythm:  Normal rate.  Pulmonary:     Effort: Pulmonary effort is normal.  Genitourinary:    Labia:        Right: No rash, tenderness or lesion.        Left: No rash, tenderness or lesion.      Vagina: Normal.     Cervix: Normal.     Uterus: Normal.      Adnexa: Right adnexa normal and left adnexa normal.  Neurological:     Mental Status: She is alert.           Assessment & Plan:  Pure hypercholesterolemia Assessment & Plan: Declines statin therapy despite recommendations. See MyChart messages.  Continue Zetia 10 mg daily. Complete CT coronary calcium  score.   Screening for cervical cancer Assessment & Plan: Pap smear completed today. Await results.   Orders: -     Cytology - PAP        Gabriel John, NP

## 2023-12-02 NOTE — Assessment & Plan Note (Signed)
 Declines statin therapy despite recommendations. See MyChart messages.  Continue Zetia 10 mg daily. Complete CT coronary calcium  score.

## 2023-12-03 LAB — CYTOLOGY - PAP
Comment: NEGATIVE
Diagnosis: NEGATIVE
High risk HPV: NEGATIVE

## 2023-12-08 ENCOUNTER — Ambulatory Visit: Payer: Self-pay | Admitting: Primary Care

## 2023-12-11 ENCOUNTER — Ambulatory Visit
Admission: RE | Admit: 2023-12-11 | Discharge: 2023-12-11 | Disposition: A | Discharge status: Home / Self Care | Payer: Self-pay | Source: Ambulatory Visit | Attending: Primary Care | Admitting: Primary Care

## 2023-12-11 ENCOUNTER — Ambulatory Visit: Payer: Self-pay | Admitting: Primary Care

## 2023-12-11 DIAGNOSIS — E7800 Pure hypercholesterolemia, unspecified: Secondary | ICD-10-CM | POA: Insufficient documentation

## 2023-12-11 DIAGNOSIS — I2510 Atherosclerotic heart disease of native coronary artery without angina pectoris: Secondary | ICD-10-CM

## 2023-12-17 ENCOUNTER — Other Ambulatory Visit: Payer: Self-pay | Admitting: Primary Care

## 2023-12-17 DIAGNOSIS — K219 Gastro-esophageal reflux disease without esophagitis: Secondary | ICD-10-CM

## 2024-01-25 ENCOUNTER — Ambulatory Visit
Admission: RE | Admit: 2024-01-25 | Discharge: 2024-01-25 | Disposition: A | Discharge status: Home / Self Care | Source: Ambulatory Visit | Attending: Primary Care | Admitting: Primary Care

## 2024-01-25 DIAGNOSIS — Z1231 Encounter for screening mammogram for malignant neoplasm of breast: Secondary | ICD-10-CM

## 2024-01-28 ENCOUNTER — Other Ambulatory Visit

## 2024-01-28 ENCOUNTER — Ambulatory Visit: Payer: Self-pay | Admitting: Primary Care

## 2024-01-28 DIAGNOSIS — E7800 Pure hypercholesterolemia, unspecified: Secondary | ICD-10-CM

## 2024-01-28 LAB — LIPID PANEL
Cholesterol: 177 mg/dL (ref 0–200)
HDL: 54.4 mg/dL (ref >39.00)
LDL Cholesterol: 110 mg/dL — ABNORMAL HIGH (ref 0–99)
NonHDL: 122.93
Total CHOL/HDL Ratio: 3
Triglycerides: 63 mg/dL (ref 0.0–149.0)
VLDL: 12.6 mg/dL (ref 0.0–40.0)

## 2024-02-04 ENCOUNTER — Other Ambulatory Visit (INDEPENDENT_AMBULATORY_CARE_PROVIDER_SITE_OTHER)

## 2024-02-04 DIAGNOSIS — I2510 Atherosclerotic heart disease of native coronary artery without angina pectoris: Secondary | ICD-10-CM

## 2024-02-04 DIAGNOSIS — E7800 Pure hypercholesterolemia, unspecified: Secondary | ICD-10-CM

## 2024-02-10 ENCOUNTER — Ambulatory Visit: Payer: Self-pay | Admitting: Primary Care

## 2024-02-10 LAB — LIPOPROTEIN A (LPA): Lipoprotein (a): 424 nmol/L — ABNORMAL HIGH (ref <75)

## 2024-02-19 ENCOUNTER — Telehealth: Payer: Self-pay | Admitting: Pharmacy Technician

## 2024-02-19 ENCOUNTER — Ambulatory Visit: Attending: Cardiology | Admitting: Cardiology

## 2024-02-19 ENCOUNTER — Encounter: Payer: Self-pay | Admitting: Cardiology

## 2024-02-19 VITALS — BP 122/72 | HR 54 | Ht 64.0 in | Wt 171.4 lb

## 2024-02-19 DIAGNOSIS — E785 Hyperlipidemia, unspecified: Secondary | ICD-10-CM | POA: Diagnosis not present

## 2024-02-19 DIAGNOSIS — I2510 Atherosclerotic heart disease of native coronary artery without angina pectoris: Secondary | ICD-10-CM | POA: Diagnosis not present

## 2024-02-19 MED ORDER — ASPIRIN 81 MG PO TBEC: 81.0000 mg | DELAYED_RELEASE_TABLET | Freq: Every day | ORAL | Status: AC

## 2024-02-19 MED ORDER — BEMPEDOIC ACID 180 MG PO TABS: 180.0000 mg | ORAL_TABLET | Freq: Every day | ORAL | 3 refills | Status: DC

## 2024-02-19 NOTE — Patient Instructions (Signed)
 Medication Instructions:  - START bempedoic acid daily  *If you need a refill on your cardiac medications before your next appointment, please call your pharmacy*  Lab Work: No labs ordered today  If you have labs (blood work) drawn today and your tests are completely normal, you will receive your results only by: MyChart Message (if you have MyChart) OR A paper copy in the mail If you have any lab test that is abnormal or we need to change your treatment, we will call you to review the results.  Testing/Procedures: Your physician has requested that you have an echocardiogram. Echocardiography is a painless test that uses sound waves to create images of your heart. It provides your doctor with information about the size and shape of your heart and how well your heart's chambers and valves are working.   You may receive an ultrasound enhancing agent through an IV if needed to better visualize your heart during the echo. This procedure takes approximately one hour.  There are no restrictions for this procedure.  This will take place at [address] [facility]) [address], [place] [address]  Please note: We ask at that you not bring children with you during ultrasound (echo/ vascular) testing. Due to room size and safety concerns, children are not allowed in the ultrasound rooms during exams. Our front office staff cannot provide observation of children in our lobby area while testing is being conducted. An adult accompanying a patient to their appointment will only be allowed in the ultrasound room at the discretion of the ultrasound technician under special circumstances. We apologize for any inconvenience.   Follow-Up: At [facility], you and your health needs are our priority.  As part of our continuing mission to provide you with exceptional heart care, our providers are all part of one team.  This team includes your primary Cardiologist (physician) and Advanced  Practice Providers or APPs (Physician Assistants and Nurse Practitioners) who all work together to provide you with the care you need, when you need it.  Your next appointment:   2 month(s)  Provider:   You may see Dr. Budd- Daleen or one of the following Advanced Practice Providers on your designated Care Team:   Lonni Meager, NP Lesley Maffucci, PA-C Bernardino Bring, PA-C Cadence Monument, PA-C Tylene Lunch, NP Barnie Hila, NP    We recommend signing up for the patient portal called MyChart.  Sign up information is provided on this After Visit Summary.  MyChart is used to connect with patients for Virtual Visits (Telemedicine).  Patients are able to view lab/test results, encounter notes, upcoming appointments, etc.  Non-urgent messages can be sent to your provider as well.   To learn more about what you can do with MyChart, go to [email or web address].

## 2024-02-19 NOTE — Telephone Encounter (Signed)
   Pharmacy Patient Advocate Encounter   Received notification from CoverMyMeds that prior authorization for nexletol is required/requested.   Insurance verification completed.   The patient is insured through [employer] .   Per test claim: PA required; PA submitted to above mentioned insurance via latent Key/confirmation #/EOC [id number] Status is pending

## 2024-02-19 NOTE — Progress Notes (Signed)
 Cardiology Office Note:    Date:  02/19/2024   ID:  Anita Poole, DOB 1966/07/04, MRN [id number]  PCP:  Gretta Comer POUR, NP    HeartCare Providers Cardiologist:  None     Referring MD: Gretta Comer POUR, NP   Chief Complaint  Patient presents with   Follow-up    New pt has been doing well with no complaints of chest pain, chest pressure or SOB, medciation reviewed verbally with patient   Anita Poole is a 58 y.o. female who is being seen today for the evaluation of CAD at the request of Gretta Comer POUR, NP.   History of Present Illness:    Anita Poole is a 58 y.o. female with a hx of CAD (LAD, LCx, RCA calcifications on chest CT on 5/25- calcium  score 320), hyperlipidemia, former smoker x 25 years who presents due to coronary calcification.  Patient had a screening coronary calcium  score 11/2023, total score 320, 97 percentile.  Mother had stents placed in her 59s.  She is smoked for over 25 years in the past.  Did not tolerate statins including Crestor , Pravachol  due to muscle aches.  Started on aspirin, Zetia  after elevated calcium  score was noted.  Past Medical History:  Diagnosis Date   Chronic pancreatitis (HCC)    Depression    GERD (gastroesophageal reflux disease)    Hepatic abscess 01/07/2020   Medical history non-contributory     Past Surgical History:  Procedure Laterality Date   BREAST REDUCTION SURGERY     CESAREAN SECTION     IR US  GUIDE BX ASP/DRAIN  01/09/2020   REDUCTION MAMMAPLASTY     SHOULDER SURGERY Left     Current Medications: Current Meds  Medication Sig   acetaminophen  (TYLENOL ) 500 MG tablet Take 1,000-1,500 mg by mouth daily as needed for mild pain.   aspirin EC 81 MG tablet Take 1 tablet (81 mg total) by mouth daily. Swallow whole.   Bempedoic Acid 180 MG TABS Take 1 tablet (180 mg total) by mouth daily.   DULoxetine  (CYMBALTA ) 20 MG capsule TAKE 2 CAPSULES BY MOUTH EVERY DAY FOR DEPRESSION AND PAIN   ezetimibe  (ZETIA )  10 MG tablet Take 1 tablet (10 mg total) by mouth daily. for cholesterol.   famotidine  (PEPCID ) 20 MG tablet TAKE 1 TABLET (20 MG TOTAL) BY MOUTH DAILY. FOR HEARTBURN.   Multiple Vitamins-Minerals (CENTRUM SILVER ADULT 50+) TABS Take 1 tablet by mouth daily.     Allergies:   Statins   Social History   Socioeconomic History   Marital status: Single    Spouse name: Not on file   Number of children: Not on file   Years of education: Not on file   Highest education level: Not on file  Occupational History   Not on file  Tobacco Use   Smoking status: Former    Current packs/day: 0.30    Average packs/day: 0.3 packs/day for 30.0 years (9.0 ttl pk-yrs)    Types: Cigarettes   Smokeless tobacco: Never  Substance and Sexual Activity   Alcohol use: No   Drug use: No   Sexual activity: Never    Birth control/protection: None  Other Topics Concern   Not on file  Social History Narrative   Single.   Moved from [place] .   Works at [employer].   Enjoys spending time with family.   Social Drivers of Corporate investment banker Strain: Not on file  Food Insecurity: Not on file  Transportation Needs:  Not on file  Physical Activity: Not on file  Stress: Not on file  Social Connections: Not on file     Family History: The patient's family history includes Coronary artery disease in her mother; Diabetes in her mother; Heart attack in her maternal uncle; Heart disease in her mother; Hypertension in her mother; Kidney failure in her mother; Lung cancer in her father. There is no history of Breast cancer.  ROS:   Please see the history of present illness.     All other systems reviewed and are negative.  EKGs/Labs/Other Studies Reviewed:    The following studies were reviewed today:  EKG Interpretation Date/Time:  Friday February 19 2024 09:24:34 EDT Ventricular Rate:  54 PR Interval:  120 QRS Duration:  74 QT Interval:  422 QTC Calculation: 400 R Axis:   -31  Text  Interpretation: Sinus bradycardia Left axis deviation Confirmed by Darliss Rogue ([id number]) on 02/19/2024 9:45:39 AM    Recent Labs: 11/24/2023: ALT 16; BUN 14; Creatinine, Ser 0.69; Potassium 4.0; Sodium 139  Recent Lipid Panel    Component Value Date/Time   CHOL 177 01/28/2024 0733   TRIG 63.0 01/28/2024 0733   HDL 54.40 01/28/2024 0733   CHOLHDL 3 01/28/2024 0733   VLDL 12.6 01/28/2024 0733   LDLCALC 110 (H) 01/28/2024 0733   LDLCALC CANCELED 02/03/2020 1430     Risk Assessment/Calculations:             Physical Exam:    VS:  BP 122/72 (BP Location: Left Arm, Patient Position: Sitting, Cuff Size: Normal)   Pulse (!) 54   Ht 5' 4 (1.626 m)   Wt 171 lb 6.4 oz (77.7 kg)   SpO2 98%   BMI 29.42 kg/m     Wt Readings from Last 3 Encounters:  02/19/24 171 lb 6.4 oz (77.7 kg)  12/02/23 175 lb (79.4 kg)  11/24/23 174 lb (78.9 kg)     GEN:  Well nourished, well developed in no acute distress HEENT: Normal NECK: No JVD; No carotid bruits CARDIAC: RRR, no murmurs, rubs, gallops RESPIRATORY:  Clear to auscultation without rales, wheezing or rhonchi  ABDOMEN: Soft, non-tender, non-distended MUSCULOSKELETAL:  No edema; No deformity  SKIN: Warm and dry NEUROLOGIC:  Alert and oriented x 3 PSYCHIATRIC:  Normal affect   ASSESSMENT:    1. Coronary artery disease involving native coronary artery of native heart, unspecified whether angina present   2. Dyslipidemia, goal LDL below 70    PLAN:    In order of problems listed above:  CAD, LAD, LCx, RCA calcifications, coronary calcium  score 2020, 97 percentile.  Start aspirin 81 mg daily, start bempedoic acid 180 mg daily, continue Zetia  10 mg daily.  Obtain echocardiogram. Hyperlipidemia, goal LDL less than 70.  Start bempedoic acid 180 mg daily, continue Zetia  10 mg daily.  Patient not tolerant to statins.   Follow-up after echocardiogram      Medication Adjustments/Labs and Tests Ordered: Current medicines are reviewed at  length with the patient today.  Concerns regarding medicines are outlined above.  Orders Placed This Encounter  Procedures   EKG 12-Lead   ECHOCARDIOGRAM COMPLETE   Meds ordered this encounter  Medications   Bempedoic Acid 180 MG TABS    Sig: Take 1 tablet (180 mg total) by mouth daily.    Dispense:  30 tablet    Refill:  3   aspirin EC 81 MG tablet    Sig: Take 1 tablet (81 mg total) by mouth daily.  Swallow whole.    Patient Instructions  Medication Instructions:  - START bempedoic acid daily  *If you need a refill on your cardiac medications before your next appointment, please call your pharmacy*  Lab Work: No labs ordered today  If you have labs (blood work) drawn today and your tests are completely normal, you will receive your results only by: MyChart Message (if you have MyChart) OR A paper copy in the mail If you have any lab test that is abnormal or we need to change your treatment, we will call you to review the results.  Testing/Procedures: Your physician has requested that you have an echocardiogram. Echocardiography is a painless test that uses sound waves to create images of your heart. It provides your doctor with information about the size and shape of your heart and how well your heart's chambers and valves are working.   You may receive an ultrasound enhancing agent through an IV if needed to better visualize your heart during the echo. This procedure takes approximately one hour.  There are no restrictions for this procedure.  This will take place at [address] [facility]) [address], [place] [address]  Please note: We ask at that you not bring children with you during ultrasound (echo/ vascular) testing. Due to room size and safety concerns, children are not allowed in the ultrasound rooms during exams. Our front office staff cannot provide observation of children in our lobby area while testing is being conducted. An adult accompanying a  patient to their appointment will only be allowed in the ultrasound room at the discretion of the ultrasound technician under special circumstances. We apologize for any inconvenience.   Follow-Up: At [facility], you and your health needs are our priority.  As part of our continuing mission to provide you with exceptional heart care, our providers are all part of one team.  This team includes your primary Cardiologist (physician) and Advanced Practice Providers or APPs (Physician Assistants and Nurse Practitioners) who all work together to provide you with the care you need, when you need it.  Your next appointment:   2 month(s)  Provider:   You may see Dr. Budd- Daleen or one of the following Advanced Practice Providers on your designated Care Team:   Lonni Meager, NP Lesley Maffucci, PA-C Bernardino Bring, PA-C Cadence [place], PA-C Tylene Lunch, NP Barnie Hila, NP    We recommend signing up for the patient portal called MyChart.  Sign up information is provided on this After Visit Summary.  MyChart is used to connect with patients for Virtual Visits (Telemedicine).  Patients are able to view lab/test results, encounter notes, upcoming appointments, etc.  Non-urgent messages can be sent to your provider as well.   To learn more about what you can do with MyChart, go to [email or web address].          Signed, Redell Cave, MD  02/19/2024 10:09 AM    [facility]

## 2024-02-24 ENCOUNTER — Telehealth: Payer: Self-pay | Admitting: Cardiology

## 2024-02-24 ENCOUNTER — Other Ambulatory Visit: Payer: Self-pay

## 2024-02-24 MED ORDER — REPATHA SURECLICK 140 MG/ML ~~LOC~~ SOAJ: 140.0000 mg | SUBCUTANEOUS | 6 refills | Status: DC

## 2024-02-24 NOTE — Telephone Encounter (Signed)
 Pt c/o medication issue:  1. Name of Medication:   Bempedoic Acid  180 MG TABS    2. How are you currently taking this medication (dosage and times per day)? N/A  3. Are you having a reaction (difficulty breathing--STAT)? N/A  4. What is your medication issue? Pt would like a alternative due to her insurance denying coverage.

## 2024-02-24 NOTE — Telephone Encounter (Signed)
 Called and spoke with patient. Patient states that her insurance has denied the PA for Bempedoic Acid .  Will forward to patient assistance.  If no assistance available patient is requesting an alternative medication because she can not afford this medication.

## 2024-02-24 NOTE — Telephone Encounter (Signed)
 No PAP available for commercially insured patients.

## 2024-02-24 NOTE — Telephone Encounter (Signed)
 Received denial:   I sent them back saying she is statin intolerant to see if they will approve it. She had pravastatin  40mg  and rosuvastatin  5mg  in the past.   Faxed [phone] 02/24/24 9:33am

## 2024-02-25 ENCOUNTER — Telehealth: Payer: Self-pay | Admitting: Pharmacy Technician

## 2024-02-25 ENCOUNTER — Other Ambulatory Visit (HOSPITAL_COMMUNITY): Payer: Self-pay

## 2024-02-25 NOTE — Telephone Encounter (Signed)
 PA request has been Submitted. New Encounter has been or will be created for follow up. For additional info see Pharmacy Prior Auth telephone encounter from 02/25/24.

## 2024-02-25 NOTE — Telephone Encounter (Signed)
 From pt calls  Pharmacy Patient Advocate Encounter   Received notification from Pt Calls Messages that prior authorization for repatha  is required/requested.   Insurance verification completed.   The patient is insured through [employer] .   Per test claim: PA required; PA submitted to above mentioned insurance via latent Key/confirmation #/EOC [facility] Status is pending

## 2024-02-29 NOTE — Telephone Encounter (Signed)
 Hi, insurance is denying this even though she has tried pravastatin  40mg  and rosuvastatin  5mg  in the past. Since she is determined statin intolerant (she has muscle aches)- They are wanting separate trials of atorvastatin  and rosuvastatin . or if she could be on a statin- they want to her have tried rosuvastatin  20mg  or atorvastatin  40mg  for at least 8 continuous weeks and still have ldl at leas 55.

## 2024-02-29 NOTE — Telephone Encounter (Signed)
 insurance is denying this even though she has tried pravastatin  40mg  and rosuvastatin  5mg  in the past. Since she is determined statin intolerant (she has muscle aches)- They are wanting separate trials of atorvastatin  and rosuvastatin . or if she could be on a statin- they want to her have tried rosuvastatin  20mg  or atorvastatin  40mg  for at least 8 continuous weeks and still have ldl at leas 55.    Sent note back on repatha  request

## 2024-03-01 MED ORDER — ATORVASTATIN CALCIUM 20 MG PO TABS
20.0000 mg | ORAL_TABLET | Freq: Every day | ORAL | 3 refills | Status: DC
Start: 2024-03-01 — End: 2024-04-22

## 2024-03-01 NOTE — Telephone Encounter (Signed)
Patient says she is returning a call. °

## 2024-03-01 NOTE — Addendum Note (Signed)
 Addended by: DASIE SHARLET RAMAN on: 03/01/2024 10:14 AM   Modules accepted: Orders

## 2024-03-01 NOTE — Telephone Encounter (Signed)
 Spoke with patient and she reports that she has already tried 2 different statins and did not tolerate them. Reviewed what insurance requires and so she is willing to try the lipitor. Advised that I will send this into her pharmacy and to let us  know if she tolerates it. She verbalized understanding with no further questions at this time.    Darliss Rogue, MD to Maudine Jon BIRCH, RN  Brien Salm, RN    03/01/24  8:35 AM This Definitely makes no sense.  If patient did not tolerate rosuvastatin  5 mg daily, why would she tolerate 20 mg?   Please let patient know what insurance requires, if she is agreeable, okay to prescribe Lipitor 20 mg daily to document 'intolerance to Lipitor specifically'.  Again this makes no sense and  Repatha  should be approved based on clinical data known so far.

## 2024-03-11 ENCOUNTER — Ambulatory Visit: Payer: Self-pay | Admitting: Cardiology

## 2024-03-11 ENCOUNTER — Ambulatory Visit: Attending: Cardiology

## 2024-03-11 DIAGNOSIS — I77810 Thoracic aortic ectasia: Secondary | ICD-10-CM | POA: Diagnosis not present

## 2024-03-11 DIAGNOSIS — I358 Other nonrheumatic aortic valve disorders: Secondary | ICD-10-CM | POA: Diagnosis not present

## 2024-03-11 DIAGNOSIS — I25110 Atherosclerotic heart disease of native coronary artery with unstable angina pectoris: Secondary | ICD-10-CM

## 2024-03-11 DIAGNOSIS — I2510 Atherosclerotic heart disease of native coronary artery without angina pectoris: Secondary | ICD-10-CM

## 2024-03-11 DIAGNOSIS — I517 Cardiomegaly: Secondary | ICD-10-CM

## 2024-03-11 LAB — ECHOCARDIOGRAM COMPLETE
AR max vel: 3.24 cm2
AV Area VTI: 3.02 cm2
AV Area mean vel: 3.02 cm2
AV Mean grad: 4 mmHg
AV Peak grad: 8.2 mmHg
Ao pk vel: 1.43 m/s
Area-P 1/2: 3.58 cm2
MV VTI: 3.81 cm2
S' Lateral: 3.1 cm
Single Plane A4C EF: 63.3 %

## 2024-04-20 ENCOUNTER — Other Ambulatory Visit: Payer: Self-pay | Admitting: Primary Care

## 2024-04-20 DIAGNOSIS — F331 Major depressive disorder, recurrent, moderate: Secondary | ICD-10-CM

## 2024-04-20 NOTE — Progress Notes (Unsigned)
 Cardiology Office Note   Date:  04/22/2024  ID:  Anita Poole, DOB August 16, 1965, MRN [id number] PCP: Gretta Comer POUR, NP  [facility] Providers Cardiologist:  None   History of Present Illness Anita Poole is a 58 y.o. female with h/o CAD (LAD, Lcx, RCA calcifications on chest CT 11/2023-calcium  score 320), chronic pancreatitis, HLD, former smoker x 25 years who presents for follow-up of echo and coronary calcification.   She was last seen 02/19/24 and was stable from a cardiac perspective. Due to family history  and risk factors, echo was ordered.  Echo showed EF 60 to 65% mild dilation of ascending aorta.  Today, the patient is overall doing well. She tried Lipitor and has severe leg cramping. Repatha  has not been approved. She has been on Zetia . She denies chest pain, SOB, lower leg edema, lightheadedness, dizziness. She does no formal walking. Diet is not bad.   Studies Reviewed EKG Interpretation Date/Time:  Friday April 22 2024 08:31:34 EDT Ventricular Rate:  52 PR Interval:  124 QRS Duration:  82 QT Interval:  444 QTC Calculation: 412 R Axis:   -25  Text Interpretation: Sinus bradycardia When compared with ECG of 19-Feb-2024 09:24, No significant change was found Confirmed by Anita Poole, Anita Poole ([id number]) on 04/22/2024 8:38:27 AM    Echo 02/2024 1. Left ventricular ejection fraction, by estimation, is 60 to 65%. The  left ventricle has normal function. The left ventricle has no regional  wall motion abnormalities. Left ventricular diastolic parameters were  normal. The average left ventricular  global longitudinal strain is -20.0 %. The global longitudinal strain is  normal.   2. Right ventricular systolic function is normal. The right ventricular  size is normal.   3. There is mild dilatation of the ascending aorta.   4. No hemodynamically significant valvular disease.   5. No prior study available or comparison.   Cardiac CT 12/2023 IMPRESSION AND  RECOMMENDATION: 1. Coronary calcium  score of 320. This was 97th percentile for age and sex matched control.   2. CAC >300 in LAD, LCx, RCA. CAC-DRS A3/N3.   3. Recommend aspirin  and statin if no contraindication.   4. Recommend cardiology consultation.   5. Continue heart healthy lifestyle and risk factor modification.   HYPERTENSION CONTROL Vitals:   04/22/24 0824 04/22/24 0827  BP: (!) 148/80 (!) 148/80    The patient's blood pressure is elevated above target today.  In order to address the patient's elevated BP: Follow up with general cardiology has been recommended.        Physical Exam VS:  BP (!) 148/80 (BP Location: Left Arm, Patient Position: Sitting, Cuff Size: Normal)   Pulse (!) 52   Ht 5' 4 (1.626 m)   Wt 171 lb 12.8 oz (77.9 kg)   SpO2 97%   BMI 29.49 kg/m        Wt Readings from Last 3 Encounters:  04/22/24 171 lb 12.8 oz (77.9 kg)  02/19/24 171 lb 6.4 oz (77.7 kg)  12/02/23 175 lb (79.4 kg)    GEN: Well nourished, well developed in no acute distress NECK: No JVD; No carotid bruits CARDIAC: RRR, no murmurs, rubs, gallops RESPIRATORY:  Clear to auscultation without rales, wheezing or rhonchi  ABDOMEN: Soft, non-tender, non-distended EXTREMITIES:  No edema; No deformity   ASSESSMENT AND PLAN  Coronary artery disease Cardiac score 320, 97 percentile for age and sex matched.  Patient denies any anginal symptoms.  She does no formal activity.  We will continue aspirin   81 mg daily and Zetia  10 mg daily.  She has a statin intolerance (muscle cramps), she has tried pravastatin  and Lipitor.  We are trying to get Repatha  approved.  Hyperlipidemia LP(a) 424.  LDL 167.  She has a statin tolerance as above.  I will refer to the lipid clinic for further testing and medication management.  We are trying to get Repatha  approved.  Elevated blood pressure Blood pressure 148/80.  Recommended lifestyle changes.  If blood pressure is high at follow-up may consider  antihypertensive medication.       Dispo: Follow-up in 6 months  Signed, Ovie Eastep VEAR Fishman, PA-C

## 2024-04-22 ENCOUNTER — Ambulatory Visit: Attending: Medical | Admitting: Medical

## 2024-04-22 ENCOUNTER — Encounter: Payer: Self-pay | Admitting: Medical

## 2024-04-22 VITALS — BP 148/80 | HR 52 | Ht 64.0 in | Wt 171.8 lb

## 2024-04-22 DIAGNOSIS — I2510 Atherosclerotic heart disease of native coronary artery without angina pectoris: Secondary | ICD-10-CM | POA: Diagnosis not present

## 2024-04-22 DIAGNOSIS — E782 Mixed hyperlipidemia: Secondary | ICD-10-CM | POA: Diagnosis not present

## 2024-04-22 DIAGNOSIS — R030 Elevated blood-pressure reading, without diagnosis of hypertension: Secondary | ICD-10-CM

## 2024-04-22 NOTE — Patient Instructions (Signed)
 Medication Instructions:  Your physician recommends that you continue on your current medications as directed. Please refer to the Current Medication list given to you today.    *If you need a refill on your cardiac medications before your next appointment, please call your pharmacy*  Lab Work: No labs ordered today   Testing/Procedures: No test ordered today   Follow-Up: At [facility], you and your health needs are our priority.  As part of our continuing mission to provide you with exceptional heart care, our providers are all part of one team.  This team includes your primary Cardiologist (physician) and Advanced Practice Providers or APPs (Physician Assistants and Nurse Practitioners) who all work together to provide you with the care you need, when you need it.  Your next appointment:   6 month(s)  Provider:   Toribio Frees, PA-C

## 2024-05-02 ENCOUNTER — Telehealth: Payer: Self-pay | Admitting: Pharmacy Technician

## 2024-05-02 ENCOUNTER — Ambulatory Visit (INDEPENDENT_AMBULATORY_CARE_PROVIDER_SITE_OTHER): Admitting: Internal Medicine

## 2024-05-02 ENCOUNTER — Other Ambulatory Visit (HOSPITAL_COMMUNITY): Payer: Self-pay

## 2024-05-02 VITALS — BP 140/70 | HR 70 | Ht 64.0 in | Wt 175.0 lb

## 2024-05-02 DIAGNOSIS — E7841 Elevated Lipoprotein(a): Secondary | ICD-10-CM | POA: Diagnosis not present

## 2024-05-02 DIAGNOSIS — I2510 Atherosclerotic heart disease of native coronary artery without angina pectoris: Secondary | ICD-10-CM | POA: Diagnosis not present

## 2024-05-02 DIAGNOSIS — T466X5D Adverse effect of antihyperlipidemic and antiarteriosclerotic drugs, subsequent encounter: Secondary | ICD-10-CM

## 2024-05-02 DIAGNOSIS — M791 Myalgia, unspecified site: Secondary | ICD-10-CM

## 2024-05-02 DIAGNOSIS — E785 Hyperlipidemia, unspecified: Secondary | ICD-10-CM

## 2024-05-02 NOTE — Patient Instructions (Signed)
 Medication Instructions:  Dr. Mona recommends Repatha  140mg /mL (PCSK9). This is an injectable cholesterol medication self-administered once every 14 days. This medication will likely need prior approval with your insurance company, which we will work on. If the medication is not approved initially, we may need to do an appeal with your insurance. If approved, we will provide you with copay and cost information. We'll then send the prescription to your pharmacy. We would have you complete another set of fasting labs between 3-4 months to reassess cholesterol.   Repatha  is self-injected once every 14 days in subcutaneous or fatty tissue - such as belly or side/outer/upper thigh. It is best stored in the refrigerator but is stable at room temp up to 28 days. Please take the pen-injector out of fridge about 30 minutes - 1 hour prior to injection, to allow it to warm closer to room temperature.   This medication is very effective in lowering LDL and can lower LPa, as Dr. Mona mentioned. It is also generally well tolerated -- most common reaction may be cold-like symptoms such as runny nose, scratchy throat, as this is an antibody therapy. It is generally self-limiting and after a few doses, your body should have normalized to the medication.   Here is a demo video: https://www.repatha .com/how-to-start-repatha -injection   If you need a co-pay card for Repatha : https://www.repatha .com/repatha -cost If you need a co-pay card for Praluent: https://praluentpatientsupport.https://sullivan-young.[email or web address]/  Patient Assistance:    These foundations have funds at various times.   The [employer]: https://www.panfoundation.org/disease-funds/hypercholesterolemia/ -- can sign up for wait list  The [facility] offers assistance to help pay for medication copays.  They will cover copays for all cholesterol lowering meds, including statins, fibrates, omega-3 fish oils like Vascepa, ezetimibe , Repatha , Praluent, Nexletol ,  Nexlizet.  The cards are usually good for $2,500 or 12 months, whichever comes first. Our fax # is [phone] (you will need this to apply) Go to healthwellfoundation.org Click on "Apply Now" Answer questions as to whom is applying (patient or representative) Your disease fund will be "hypercholesterolemia - Medicare access" They will ask questions about finances and which medications you are taking for cholesterol When you submit, the approval is usually within minutes.  You will need to print the card information from the site You will need to show this information to your pharmacy, they will bill your Medicare Part D plan first -then bill [employer] --for the copay.   You can also call them at [phone], although the hold times can be quite long.     *If you need a refill on your cardiac medications before your next appointment, please call your pharmacy*  Lab Work: FASTING lab work in 3-4 months/at least one week before your visit with Cadence PA -- NMR lipoprofile and LPa  If you have labs (blood work) drawn today and your tests are completely normal, you will receive your results only by: MyChart Message (if you have MyChart) OR A paper copy in the mail If you have any lab test that is abnormal or we need to change your treatment, we will call you to review the results.   Follow-Up: At [facility], you and your health needs are our priority.  As part of our continuing mission to provide you with exceptional heart care, our providers are all part of one team.  This team includes your primary Cardiologist (physician) and Advanced Practice Providers or APPs (Physician Assistants and Nurse Practitioners) who all work together to provide you with the care  you need, when you need it.  Your next appointment:    AS SCHEDULED with Cadence PA  We recommend signing up for the patient portal called MyChart.  Sign up information is provided on this After Visit Summary.   MyChart is used to connect with patients for Virtual Visits (Telemedicine).  Patients are able to view lab/test results, encounter notes, upcoming appointments, etc.  Non-urgent messages can be sent to your provider as well.   To learn more about what you can do with MyChart, go to [email or web address].

## 2024-05-02 NOTE — Progress Notes (Signed)
 LIPID CLINIC CONSULT NOTE  Chief Complaint:  Elevated LP(a), statin intolerant  Primary Care Physician: Gretta Comer POUR, NP  Primary Cardiologist:  None  HPI:  Anita Poole is a 58 y.o. female who is being seen today for the evaluation of elevated LP(a) at the request of Furth, Cadence H, PA-C. This is a pleasant 58 year old female who is kindly referred for an elevated LP(a).  This has been generally elevated around 400-450.  She also has a family history of this in her sister whose LP(a) is high in the 77s.  I have seen her in the office as well.  Anita Poole does have coronary artery calcification however which is significant and age advanced.  In May her calcium  score was 320, 97 percentile for age and sex matched controls.  Unfortunately, she has been unable to tolerate statins having tried atorvastatin  and rosuvastatin  and more recently pravastatin .  She did get some improvement in her lipids however it remained well above an LDL target less than 55 and she could not tolerate the pravastatin .  She is on ezetimibe  and per current guidelines will need additional lipid-lowering to reach goal.  PMHx:  Past Medical History:  Diagnosis Date   Chronic pancreatitis (HCC)    Depression    GERD (gastroesophageal reflux disease)    Hepatic abscess 01/07/2020   Medical history non-contributory     Past Surgical History:  Procedure Laterality Date   BREAST REDUCTION SURGERY     CESAREAN SECTION     IR US  GUIDE BX ASP/DRAIN  01/09/2020   REDUCTION MAMMAPLASTY     SHOULDER SURGERY Left     FAMHx:  Family History  Problem Relation Age of Onset   Diabetes Mother    Kidney failure Mother    Hypertension Mother    Coronary artery disease Mother    Heart disease Mother    Lung cancer Father    Heart attack Maternal Uncle    Breast cancer Neg Hx     SOCHx:   reports that she has quit smoking. Her smoking use included cigarettes. She has a 9 pack-year smoking history. She  has never used smokeless tobacco. She reports that she does not drink alcohol and does not use drugs.  ALLERGIES:  Allergies  Allergen Reactions   Lipitor [Atorvastatin ]     Muscle cramping    Statins     Myalgias with crestor , pravachol     ROS: Pertinent items noted in HPI and remainder of comprehensive ROS otherwise negative.  HOME MEDS: Current Outpatient Medications on File Prior to Visit  Medication Sig Dispense Refill   acetaminophen  (TYLENOL ) 500 MG tablet Take 1,000-1,500 mg by mouth daily as needed for mild pain.     aspirin  EC 81 MG tablet Take 1 tablet (81 mg total) by mouth daily. Swallow whole.     DULoxetine  (CYMBALTA ) 20 MG capsule TAKE 2 CAPSULES BY MOUTH EVERY DAY FOR DEPRESSION AND PAIN 180 capsule 1   ezetimibe  (ZETIA ) 10 MG tablet Take 1 tablet (10 mg total) by mouth daily. for cholesterol. 90 tablet 3   famotidine  (PEPCID ) 20 MG tablet TAKE 1 TABLET (20 MG TOTAL) BY MOUTH DAILY. FOR HEARTBURN. 90 tablet 2   Multiple Vitamins-Minerals (CENTRUM SILVER ADULT 50+) TABS Take 1 tablet by mouth daily.     No current facility-administered medications on file prior to visit.    LABS/IMAGING: No results found for this or any previous visit (from the past 48 hours). No results found.  LIPID PANEL:    Component Value Date/Time   CHOL 177 01/28/2024 0733   TRIG 63.0 01/28/2024 0733   HDL 54.40 01/28/2024 0733   CHOLHDL 3 01/28/2024 0733   VLDL 12.6 01/28/2024 0733   LDLCALC 110 (H) 01/28/2024 0733   LDLCALC CANCELED 02/03/2020 1430    Lipoprotein (a)  Date/Time Value Ref Range Status  02/04/2024 07:26 AM 424 (H) <75 nmol/L Final    Comment:    . Risk Category   Optimal        < 75 nmol/L   Moderate   75 - 125 nmol/L   High          > 125 nmol/L . Cardiovascular event risk category cut points (optimal, moderate, high) are based on Tsimika S. JACC 2017;69:692-711. .      WEIGHTS: Wt Readings from Last 3 Encounters:  05/02/24 175 lb (79.4 kg)   04/22/24 171 lb 12.8 oz (77.9 kg)  02/19/24 171 lb 6.4 oz (77.7 kg)    VITALS: BP (!) 140/70 (BP Location: Left Arm, Patient Position: Sitting, Cuff Size: Normal)   Pulse 70   Ht 5' 4 (1.626 m)   Wt 175 lb (79.4 kg)   BMI 30.04 kg/m   EXAM: Deferred  EKG: Deferred  ASSESSMENT: Elevated LP(a)-424 nmol/L CAC score of 320, 97th percentile (11/2023) Statin intolerant - myalgias on pravastatin , atorvastatin , rosuvastatin , currently on ezetimibe   PLAN: 1.   Anita Poole has a very high calcium  score and has been intolerant to 3 different statins.  She is currently on ezetimibe  however her recent lipid profile showed total cholesterol 177, triglycerides 63, HDL 54 and LDL 110.  Again this was on pravastatin  which has been discontinued so I expect her lipids will go up.  Nonetheless her target LDL should be less than 55 as she is very high risk and she will need substantial additional cardiovascular risk reduction.  Since she is on ezetimibe , I would advise adding a PCSK9 inhibitor.  Will reach out for prior authorization for that with her insurance per current guidelines.  She could potentially get additional reduction in LP(a) as a result of this.  She also may be a candidate for an upcoming clinical trial at specific LP(a) lowering.  Will hold onto her name as a possible candidate.  Thanks again for the kind referral.  Anita KYM Maxcy, MD, [facility], FNLA, FACP  [place]  [facility]  Medical Director of the Advanced Lipid Disorders &  Cardiovascular Risk Reduction Clinic Diplomate of the American Board of Clinical Lipidology Attending Cardiologist  Direct Dial: [phone]  Fax: [phone]  Website:  [email or web address]  Anita BROCKS Mckinzee Spirito 05/02/2024, 3:31 PM

## 2024-05-02 NOTE — Telephone Encounter (Signed)
 Pharmacy Patient Advocate Encounter   Received notification from Physician's Office that prior authorization for Repatha  is required/requested.   Insurance verification completed.   The patient is insured through [employer].   Per test claim: The current 05/02/24 day co-pay is, $30.00- one month.  No PA needed at this time. This test claim was processed through [facility]- copay amounts may vary at other pharmacies due to pharmacy/plan contracts, or as the patient moves through the different stages of their insurance plan.

## 2024-05-04 MED ORDER — REPATHA SURECLICK 140 MG/ML ~~LOC~~ SOAJ: 140.0000 mg | SUBCUTANEOUS | 3 refills | Status: AC

## 2024-05-04 NOTE — Addendum Note (Signed)
 Addended by: LORING ANDRIETTE HERO on: 05/04/2024 07:46 AM   Modules accepted: Orders

## 2024-05-05 NOTE — Telephone Encounter (Signed)
Rx sent to patient's preferred pharmacy.

## 2024-05-10 ENCOUNTER — Ambulatory Visit

## 2024-05-10 ENCOUNTER — Ambulatory Visit (INDEPENDENT_AMBULATORY_CARE_PROVIDER_SITE_OTHER)
Admission: RE | Admit: 2024-05-10 | Discharge: 2024-05-10 | Disposition: A | Discharge status: Home / Self Care | Source: Ambulatory Visit

## 2024-05-10 ENCOUNTER — Telehealth: Payer: Self-pay

## 2024-05-10 VITALS — BP 126/78 | HR 64 | Temp 97.5°F | Ht 64.0 in | Wt 174.0 lb

## 2024-05-10 DIAGNOSIS — M79631 Pain in right forearm: Secondary | ICD-10-CM

## 2024-05-10 DIAGNOSIS — M25521 Pain in right elbow: Secondary | ICD-10-CM | POA: Diagnosis not present

## 2024-05-10 MED ORDER — IBUPROFEN 800 MG PO TABS: 800.0000 mg | ORAL_TABLET | Freq: Three times a day (TID) | ORAL | 0 refills | Status: AC | PRN

## 2024-05-10 MED ORDER — ACETAMINOPHEN 500 MG PO TABS: 1000.0000 mg | ORAL_TABLET | Freq: Three times a day (TID) | ORAL | 0 refills | Status: AC | PRN

## 2024-05-10 NOTE — Progress Notes (Signed)
 Subjective:   This visit was conducted in person. The patient gave informed consent to the use of Abridge AI technology to record the contents of the encounter as documented below.   Patient ID: Anita Poole, female    DOB: April 23, 1966, 58 y.o.   MRN: [id number]   Discussed the use of AI scribe software for clinical note transcription with the patient, who gave verbal consent to proceed.  History of Present Illness Anita Poole is a 58 year old female who presents with persistent right arm pain following a fall.  She has been experiencing persistent pain in her right arm following a fall nearly a month ago. The incident occurred as she was leaving a restaurant, where she tripped on a curb and fell, hitting her face on the cement and injuring her arm. Initial care was sought at an urgent care facility in [place] , where x-rays were performed and no fractures were identified. However, she continues to experience significant pain and swelling in the arm.  The pain is described as a constant throbbing sensation, rated at a 5 to 6 out of 10 in intensity. It is localized primarily in the elbow and forearm, with radiation down the arm. The pain is exacerbated by movement and use, such as lifting objects or typing, which she does frequently for work. She has difficulty performing daily tasks, such as lifting a cup of coffee or shifting her car gears, due to decreased strength and pain in the arm.  She has attempted various treatments for pain relief, including Arnica gel, Solanpas with lidocaine , Epsom salt soaks, and wrapping the elbow with an ACE bandage. She has been taking aspirin  and ibuprofen, approximately 600 mg three to four times daily, for pain management. Despite these efforts, she has not found significant relief. The swelling in the arm has slightly improved but remains present. She also reports a bump on the elbow that is not present on the other side. No alleviating factors for the pain  have been identified, and it remains consistent throughout the day.  She works on a computer all day, which exacerbates her arm pain. No weakness in fingers but decreased strength in the arm when lifting objects.   Review of Systems      Allergies  Allergen Reactions   Lipitor [Atorvastatin ]     Muscle cramping    Statins     Myalgias with crestor , pravachol     Current Outpatient Medications on File Prior to Visit  Medication Sig Dispense Refill   aspirin  EC 81 MG tablet Take 1 tablet (81 mg total) by mouth daily. Swallow whole.     DULoxetine  (CYMBALTA ) 20 MG capsule TAKE 2 CAPSULES BY MOUTH EVERY DAY FOR DEPRESSION AND PAIN 180 capsule 1   Evolocumab  (REPATHA  SURECLICK) 140 MG/ML SOAJ Inject 140 mg into the skin every 14 (fourteen) days. 6 mL 3   ezetimibe  (ZETIA ) 10 MG tablet Take 1 tablet (10 mg total) by mouth daily. for cholesterol. 90 tablet 3   famotidine  (PEPCID ) 20 MG tablet TAKE 1 TABLET (20 MG TOTAL) BY MOUTH DAILY. FOR HEARTBURN. 90 tablet 2   Multiple Vitamins-Minerals (CENTRUM SILVER ADULT 50+) TABS Take 1 tablet by mouth daily.     No current facility-administered medications on file prior to visit.    BP 126/78 (BP Location: Left Arm, Patient Position: Sitting, Cuff Size: Normal)   Pulse 64   Temp (!) 97.5 F (36.4 C) (Oral)   Ht 5' 4 (1.626 m)   Wt 174  lb (78.9 kg)   SpO2 95%   BMI 29.87 kg/m   Objective:      Physical Exam GENERAL: Alert, cooperative, well developed, no acute distress. HEAD: Normocephalic atraumatic. NEUROLOGICAL: Oriented to person, place and time, no gait abnormalities MUSCULOSKELETAL: Right elbow pain on flexion and extension, tenderness on palpation over the olecranon process, medial and frontal elbow, mild swelling, decreased range of motion. Normal finger movement.  No sensory deficit.      Assessment & Plan:   Assessment & Plan 1. Right elbow pain (Primary) 2. Right forearm pain Persistent pain and swelling post-fall  several weeks ago. Previous x-rays negative for fracture but showed joint effusion.  Most likely differentials for continued pain include missed fracture versus worsened joint effusion.  Weakness could be explained by possible muscle atrophy from several weeks of reduced use of the right forearm due to pain, will refer to PT as well. Given point bony tenderness over the elbow, concern for possible olecranon fracture, x-ray ordered as below.  Will also repeat forearm x-ray to rule out missed fracture.  In the meantime, pain control with alternating Motrin and Tylenol , patient already on Pepcid  for GI prophylaxis.  Next steps TBD based on imaging findings.  - Order repeat x-ray of right forearm and elbow. - Refer to physical therapy for muscle atrophy and strength improvement. - Prescribe ibuprofen 800 mg every 8 hours as needed, alternating with extra strength Tylenol  1000 mg every 8 hours for moderate pain. - Advise taking ibuprofen with food to minimize ulcer risk. - Continue Pepcid  20 mg daily. - Instruct on scheduled pain management for 5 days, then as-needed. - Advise monitoring for stomach pain or burning and report symptoms.      Return for worsening of symptoms or failure to improve.   Anita Poole K Minal Stuller, MD  05/10/24     Contains text generated by Abridge.

## 2024-05-10 NOTE — Telephone Encounter (Signed)
 Called patient and reviewed x-ray results with her.  Explained that there is both joint effusion and radial head fracture present and this would explain her continued pain, swelling and reduced range of motion. Counseled patient to get an elbow sling from pharmacy for comfort.  Counseled to go to [facility] as early as possible tomorrow for orthopedic evaluation, if she cannot be seen, will send emergent Ortho referral. In the meantime, counseled her to continue pain control with high-dose NSAIDs and Tylenol  as previously discussed.  Patient verbalized understanding.

## 2024-05-10 NOTE — Patient Instructions (Addendum)
 Thank you for visiting [facility] today! Here's what we talked about: - Alternate Ibuprofen and Tylenol  every 4 hours, do this scheduled for the next 5 days, then switch to as needed use. - PT will call you

## 2024-05-23 ENCOUNTER — Institutional Professional Consult (permissible substitution) (HOSPITAL_BASED_OUTPATIENT_CLINIC_OR_DEPARTMENT_OTHER): Admitting: Internal Medicine

## 2024-06-03 ENCOUNTER — Encounter: Payer: Self-pay | Admitting: *Deleted

## 2024-06-03 DIAGNOSIS — Z006 Encounter for examination for normal comparison and control in clinical research program: Secondary | ICD-10-CM

## 2024-06-03 NOTE — Research (Signed)
 Message left for Anita Poole about pre-event. Encouraged her to call with any questions.   Anita Poole called back and states she would like more information. Emailed her a copy of the consent to read over. Encouraged he to call or email me any questions.

## 2024-06-13 ENCOUNTER — Encounter: Payer: Self-pay | Admitting: *Deleted

## 2024-06-13 DIAGNOSIS — Z006 Encounter for examination for normal comparison and control in clinical research program: Secondary | ICD-10-CM

## 2024-06-13 NOTE — Research (Signed)
 Anita Poole called states she would like to come in for screening for Pre-event. Scheduled Dec 16 th at 0730

## 2024-07-05 ENCOUNTER — Encounter: Admitting: *Deleted

## 2024-07-05 DIAGNOSIS — Z006 Encounter for examination for normal comparison and control in clinical research program: Secondary | ICD-10-CM

## 2024-07-05 NOTE — Research (Signed)
 Subject Name: Anita Poole  Subject met inclusion and exclusion criteria.  The informed consent form, study requirements and expectations were reviewed with the subject and questions and concerns were addressed prior to the signing of the consent form.  The subject verbalized understanding of the trial requirements.  The subject agreed to participate in the  lpa screening for pre-event  trial and signed the informed consent at 1000 on 05-Jul-2024.  The informed consent was obtained prior to performance of any protocol-specific procedures for the subject.  A copy of the signed informed consent was given to the subject and a copy was placed in the subject's medical record.   Olene Godfrey, [place]   Pre-event  INCLUSION  Yes No  Participant has provided written informed consent before initiation of any study-specific activities/procedures. [x]  []   Age >= 50 years at the time of signing of the Lp(a) screening ICF. [x]  []   Lp(a) >= 200 nmol/L during Lp(a) screening by a central laboratory using an investigational IVD test. At least 4 weeks of stable and optimized lipid-lowering therapy consistent with regional/local clinical practice guidelines or according to investigators judgment before Lp(a) screening.                                                                                                         _______________ Waiting on results    Participants meeting at least one of the following categories (A or B):  A. Multiple risk factors for atherosclerotic disease  1. Presence of >= 4 of any of the following ASCVD risk factors: a. Age >= 65 years men or women b. History of hypertension requiring pharmacotherapy c. Current cigarette tobacco smoking d. Diabetes mellitus (Type 1 or Type 2) requiring pharmacotherapy e. Screening high-sensitivity C-reactive protein (hs-CRP) >= 2.0 mg/L by central laboratory f. Screening estimated glomerular filtration rate (eGFR) 30 to < 60 mL/min/1.73  m2 by central laboratory g. Familial hypercholesterolemia  or family history of premature ASCVD (1st degree relative before age 71 years for males, and/or 1st degree relative before age 38 years for females)  AND/OR  B. History of atherosclerosis evidenced by:  1. Coronary Artery Disease-Reporting and Data System (CAD-RADS) P3, and/or 2. Coronary artery calcium  (CAC) > 300, and/or                          CAC 320 3. Atherosclerosis defined by at least one of the following AND >= 2 additional risk factors listed in 104 A: a. Coronary atherosclerosis as evidenced by a stenosis >= 50% in at least one artery b. Coronary atherosclerosis as evidenced by a stenosis >= 25% (or reported as at least mild) in at least 2 arteries detected on invasive or noninvasive imaging c. Coronary atherosclerosis as evidenced by a CAC score > 100 and/or CAD-RADS >= P2 d. Carotid atherosclerosis as evidenced by an internal carotid stenosis >= 50% e. Peripheral atherosclerosis as evidenced by >= 50% stenosis of limb artery and/or ankle brachial index < 0.9  Bold the above patient meets.  [x]  []   EXCLUSION Yes No  Prior acute atherothrombotic qualifying event at any time, defined as prior myocardial infarction, prior stroke, prior transient ischemic attack, or prior acute limb ischemia. []  [x]   Prior arterial revascularization at any time suspected to be associated with atherosclerosis. []  [x]   If available, participants without known atherosclerosis, CAC score = 0, CAD-RADS = 0, in the last 10 years prior to enrollment. []  [x]   Severe renal dysfunction, defined as an eGFR < 30 mL/min/1.73 m2 by central laboratory during screening []  [x]   History of decompensated liver cirrhosis and/or screening aspartate aminotransferase (AST) or alanine aminotransferase (ALT) > 3 x upper limit of normal (ULN), or total bilirubin (TBL) > 2 x ULN (except in  stable Gilberts syndrome). []  [x]   History of major bleeding disorder (eg, hemophilia, von Willebrand disease, clotting factor deficiencies, etc). []  [x]   Planned arterial revascularization (percutaneous or surgical). []  [x]   Fasting triglycerides > 400 mg/dL (5.47 mmol/L) during screening. []  [x]   Participant has known sensitivity to any of the products or components to be administered during dosing or the study procedures. []  [x]   Malignancy not in remission for at least 5 years prior to enrollment, exceptions for less than 5 years since remission include non-melanoma skin cancers, localized thyroid  cancer (papillary, follicular, and medullary), cervical in situ carcinoma, breast ductal carcinoma in situ, or stage 1 prostate carcinoma. []  [x]   Diagnosis of severe heart failure (New York  Heart Association Functional Classification IV), and/or if available, most recent left ventricular ejection fraction < 30%. []  [x]   Uncontrolled or recurrent ventricular tachycardia in the past 3 months before enrollment. []  [x]   Atrial fibrillation or flutter and not on an anticoagulant despite an indication to be anticoagulated.    Uncontrolled hypertension at screening, defined as systolic blood pressure > 180 mmHg or diastolic blood pressure > 110 mmHg at rest despite antihypertensive therapy. []  [x]   Diabetes mellitus (Type 1 or Type 2) with a hemoglobin A1C (HbA1c) >= 10% by central laboratory at screening. []  [x]   History or evidence of any other clinically significant disorder, condition, or disease (such as active infection) that, in the opinion of the investigator or [employer] physician, if consulted, would pose a risk to participant safety, or interfere with the study evaluation, procedures or completion. []  [x]   Current or planned lipoprotein apheresis or < 3 months since last apheresis treatment before enrollment. []  [x]   Participant has taken a cholesteryl ester transfer protein inhibitor or  lomitapide in the last 12 months before enrollment. []  [x]   Previously received any therapy specifically targeting Lp(a) including but not limited to, olpasiran, pelacarsen, lepodisiran, zerlasiran, or muvalaplin []  [x]    Currently receiving treatment in another investigational device or drug study, or less than 30 days since ending treatment on another investigational device or drug study(ies). Other investigational procedures while participating in this study are excluded. []  [x]   Participants of childbearing potential unwilling to use protocol-specified method of contraception during treatment and for an additional 30 days after the last dose of investigational product.  []  [x]   Participants who are breastfeeding or who plan to breastfeed while on study through 30 days after the last dose of investigational product []  [x]   Participants planning to become pregnant while on study through 30 days after the last dose of investigational product.  []  [x]   Participants of childbearing potential with a positive pregnancy test assessed at screening and/or day 1 by a highly sensitive urine or serum  pregnancy test. []  [x]   Participant likely to not be available to complete all protocol-required study visits or procedures, and/or to comply with all required study procedures (eg, Clinical Outcome Assessments) to the best of the Participant and investigators knowledge.                         []  [x]      Pre-Event Lp(a) Screening Visit  Date of Visit:  05-Jul-2024                Subject [id number]  During this visit the following activities were completed:  [x] Reading, Signing and Understanding the informed Consent for Lp(a) screening  [x] Demographics  [x] Medical History  [x] Adverse events  [x] Serious adverse device effects  [x] Concomitant therapies reviewed  [x] Central labs      [x] Lp(a)  Ms Schwimmer is here for Lpa screening of pre-event. She was placed in a quiet room,given  time to review consent and ask questions. She reports no pain, no visits to the Ed or Urgent care. Informed her I will call with lab results. Depending on results we will schedule her next appointment at that time. Voices understanding.  Current Medications[1]      [1]  Current Outpatient Medications:    acetaminophen  (TYLENOL ) 500 MG tablet, Take 2 tablets (1,000 mg total) by mouth every 8 (eight) hours as needed for moderate pain (pain score 4-6) (alternate with ibuprofen )., Disp: 30 tablet, Rfl: 0   aspirin  EC 81 MG tablet, Take 1 tablet (81 mg total) by mouth daily. Swallow whole., Disp: , Rfl:    DULoxetine  (CYMBALTA ) 20 MG capsule, TAKE 2 CAPSULES BY MOUTH EVERY DAY FOR DEPRESSION AND PAIN, Disp: 180 capsule, Rfl: 1   Evolocumab  (REPATHA  SURECLICK) 140 MG/ML SOAJ, Inject 140 mg into the skin every 14 (fourteen) days., Disp: 6 mL, Rfl: 3   ezetimibe  (ZETIA ) 10 MG tablet, Take 1 tablet (10 mg total) by mouth daily. for cholesterol., Disp: 90 tablet, Rfl: 3   famotidine  (PEPCID ) 20 MG tablet, TAKE 1 TABLET (20 MG TOTAL) BY MOUTH DAILY. FOR HEARTBURN., Disp: 90 tablet, Rfl: 2   Multiple Vitamins-Minerals (CENTRUM SILVER ADULT 50+) TABS, Take 1 tablet by mouth daily., Disp: , Rfl:

## 2024-07-11 ENCOUNTER — Encounter: Payer: Self-pay | Admitting: *Deleted

## 2024-07-11 DIAGNOSIS — Z006 Encounter for examination for normal comparison and control in clinical research program: Secondary | ICD-10-CM

## 2024-07-11 NOTE — Research (Signed)
 Message left for Anita Poole about coming in for the main screening visit. Encouraged her to call me back.

## 2024-07-25 ENCOUNTER — Encounter: Admitting: *Deleted

## 2024-07-25 VITALS — BP 130/74 | HR 58 | Temp 97.9°F | Resp 16 | Ht 64.5 in | Wt 170.5 lb

## 2024-07-25 DIAGNOSIS — Z006 Encounter for examination for normal comparison and control in clinical research program: Secondary | ICD-10-CM

## 2024-07-25 NOTE — Research (Addendum)
 Pre-Event Main Screening Clarita Shoemaker              Chemistry: Alkaline Phosphatase 147 U/L                           [] Clinically Significant  [x] Not Clinically Significant   Hematology: Basophils 2.3  %                                                 [] Clinically Significant  [x] Not Clinically Significant Hgb A1c 6.6                                                        [] Clinically Significant  [x] Not Clinically Significant   Lipids:  Apolipoprotein B  46.0  mg/dL                              [] Clinically Significant  [x] Not Clinically Significant   Any further action needed to be taken per the PI?  No  Vinie KYM Maxcy, MD, [facility], FNLA, FACP  [place]  [facility]  Medical Director of the Advanced Lipid Disorders &  Cardiovascular Risk Reduction Clinic Diplomate of the American Board of Clinical Lipidology Attending Cardiologist  Direct Dial: [phone]  Fax: [phone]  Website:  [email or web address]       Coag: PT  9.5                         [] Clinically Significant  [x] Not Clinically Significant     Any further action needed to be taken per the PI? No  Vinie KYM Maxcy, MD, [facility], FNLA, FACP  [place]  [facility]  Medical Director of the Advanced Lipid Disorders &  Cardiovascular Risk Reduction Clinic Diplomate of the American Board of Clinical Lipidology Attending Cardiologist  Direct Dial: [phone]  Fax: [phone]  Website:  [email or web address]                       Lipids:  Cholesterol 123  mg/dL                          [] Clinically Significant  [x] Not Clinically Significant HDL Cholesterol 66 mg/dL                     [] Clinically Significant  [x] Not Clinically Significant   Any further action needed to be taken per the PI? No  Vinie KYM Maxcy, MD, [facility], FNLA, FACP  [place]  [facility]  Medical Director of the Advanced Lipid Disorders &  Cardiovascular Risk Reduction  Clinic Diplomate of the American Board of Clinical Lipidology Attending Cardiologist  Direct Dial: [phone]  Fax: [phone]  Website:  [email or web address]            Chemistry: Glucose   126   mg/dL                  [] Clinically Significant  [x] Not Clinically Significant    Any further action needed to be taken per the PI?  No  Vinie KYM Maxcy, MD, [facility], FNLA, FACP  [place]  [facility]  Medical Director of the Advanced Lipid Disorders &  Cardiovascular Risk Reduction Clinic Diplomate of the American Board of Clinical Lipidology Attending Cardiologist  Direct Dial: [phone]  Fax: [phone]  Website:  [email or web address]    Subject Name: Jameyah Fennewald  Subject met inclusion and exclusion criteria.  The informed consent form, study requirements and expectations were reviewed with the subject and questions and concerns were addressed prior to the signing of the consent form.  The subject verbalized understanding of the trial requirements.  The subject agreed to participate in the pre-event trial and signed the informed consent at 0835 on 25-Jul-2024.  The informed consent was obtained prior to performance of any protocol-specific procedures for the subject.  A copy of the signed informed consent was given to the subject and a copy was placed in the subject's medical record.   Nissi Doffing, [place]    Date of Visit: 25-Jul-2024                       Subject Number:[id number]  During this visit the following activities were completed:  [x]  Reading, Signing and Understanding the informed Consent   [x]  Medical History                                         [x]  Smoking History (Prior, or concurrent use) former  [x]  Vital signs     Arm: left arm                        [x]  Adverse events      BP:130/74      HR: 58                                                       [x]  Serious adverse device effects      Resp:  16                                                                                      Temp: 36.6                                                 [x]  Concomitant therapies reviewed      O2 Sat:97%      Height: 5 ft 4.5 in                                                      Weight:170.5 lbs      Waist  circumference: 41 in   [x]  Central Labs        []  Urine Preg test        []  Serum Preg test        []  FSH (only if needed to determine childbearing potential)         [x]  Fasting glucose         [x]  HbA1c        [x]  Fasting lipid panel & apolipoproteins        [x]  Coag        [x]  Chemistry        [x]  Hematology        [x]  Hs-CRP   Place in a quiet room. Given time to read over the consent and ask questions. Blood drawn at 0850. VS taken at 0842. Informed Ms Kenan I will call her with lab results, and we can scheduled her next visit then, if she meets inclusion/exclusion. Voices understanding.   Current Medications[1]      [1]  Current Outpatient Medications:    acetaminophen  (TYLENOL ) 500 MG tablet, Take 2 tablets (1,000 mg total) by mouth every 8 (eight) hours as needed for moderate pain (pain score 4-6) (alternate with ibuprofen )., Disp: 30 tablet, Rfl: 0   aspirin  EC 81 MG tablet, Take 1 tablet (81 mg total) by mouth daily. Swallow whole., Disp: , Rfl:    DULoxetine  (CYMBALTA ) 20 MG capsule, TAKE 2 CAPSULES BY MOUTH EVERY DAY FOR DEPRESSION AND PAIN, Disp: 180 capsule, Rfl: 1   Evolocumab  (REPATHA  SURECLICK) 140 MG/ML SOAJ, Inject 140 mg into the skin every 14 (fourteen) days., Disp: 6 mL, Rfl: 3   ezetimibe  (ZETIA ) 10 MG tablet, Take 1 tablet (10 mg total) by mouth daily. for cholesterol., Disp: 90 tablet, Rfl: 3   famotidine  (PEPCID ) 20 MG tablet, TAKE 1 TABLET (20 MG TOTAL) BY MOUTH DAILY. FOR HEARTBURN., Disp: 90 tablet, Rfl: 2   Multiple Vitamins-Minerals (CENTRUM SILVER ADULT 50+) TABS, Take 1 tablet by mouth daily., Disp: , Rfl:

## 2024-07-28 ENCOUNTER — Encounter: Payer: Self-pay | Admitting: *Deleted

## 2024-07-28 DIAGNOSIS — Z006 Encounter for examination for normal comparison and control in clinical research program: Secondary | ICD-10-CM

## 2024-07-28 NOTE — Research (Signed)
 Message left for Anita Poole to schedule her next research appointment.

## 2024-08-08 ENCOUNTER — Encounter: Admitting: *Deleted

## 2024-08-08 ENCOUNTER — Encounter: Payer: Self-pay | Admitting: *Deleted

## 2024-08-08 VITALS — BP 162/91 | HR 88 | Temp 98.8°F | Resp 16

## 2024-08-08 DIAGNOSIS — Z006 Encounter for examination for normal comparison and control in clinical research program: Secondary | ICD-10-CM

## 2024-08-08 MED ORDER — STUDY - OCEAN(A)-PRE-EVENT - OLPASIRAN 142 MG/ML OR PLACEBO SQ INJECTION (PI-HILTY)
142.0000 mg | PREFILLED_SYRINGE | SUBCUTANEOUS | Status: AC
  Administered 2024-08-08: 142 mg via SUBCUTANEOUS
  Filled 2024-08-08: qty 1

## 2024-08-08 NOTE — Research (Addendum)
 Pre-Event Day 1 Visit  Date of Visit: 08-Aug-2024            Subject Number: [id number]  During this visit the following activities were completed:  [x]   Enrollment/randomization      [x]  Review inclusion and exclusion  [x]  Medical History      [x]  Vital signs        Jmf:ozqu arm        BP:151/101 recheck 162/91       HR: 88       Resp: 16         Temp: 37.1       O2 Sat:96%  [x]  Physical Exam Dr Mona   [x]  EKG  [x]  Adverse events  [x]  Serious adverse device effects  [x] Vital Status/ non-fatal PEPs  [x]  Concomitant therapies reviewed  [x]  Central Labs       []  Urine Preg       [x]  Fasting glucose       [x]  HbA1c       [x] Fasting lipid panel & apolipoproteins       [x]  Lp(a)        [x] Coag       [x]  Chemistry       [x]  UACR       [x]  Hematology       [x]  Anti-olpasiran antibody   [x] Biomarker assessment    [x]  Biomarker development sample  [x] Pharmacokinetic assessments     [x]  Olpasiran serum PK (obtain 1-72 hors after IP given)  [x] Genetic assessment      [x]  Genetic research (optional)   [x]  Olpasiran or placebo        [x]  Monitor at least 30 mins  Ms Paisley is here for Day 1 for pre-event. She reports no visits to the ed or urgent care, no changes in her meds, no abd pain, or other pain since last seen.  Dr Mona here for physical exam. Vs taken at 0940 and bp re checked at 0945. Blood drawn at 0955. Urine obtained at 1006. Injection given at 1007 in left lower abd tol well. kit number [id number]. Scheduled next appointment for Feb 16th at 0800.  Current Medications[1]      [1]  Current Outpatient Medications:    acetaminophen  (TYLENOL ) 500 MG tablet, Take 2 tablets (1,000 mg total) by mouth every 8 (eight) hours as needed for moderate pain (pain score 4-6) (alternate with ibuprofen )., Disp: 30 tablet, Rfl: 0   aspirin  EC 81 MG tablet, Take 1 tablet (81 mg total) by mouth daily. Swallow whole., Disp: , Rfl:    DULoxetine  (CYMBALTA ) 20 MG capsule,  TAKE 2 CAPSULES BY MOUTH EVERY DAY FOR DEPRESSION AND PAIN, Disp: 180 capsule, Rfl: 1   Evolocumab  (REPATHA  SURECLICK) 140 MG/ML SOAJ, Inject 140 mg into the skin every 14 (fourteen) days., Disp: 6 mL, Rfl: 3   ezetimibe  (ZETIA ) 10 MG tablet, Take 1 tablet (10 mg total) by mouth daily. for cholesterol., Disp: 90 tablet, Rfl: 3   famotidine  (PEPCID ) 20 MG tablet, TAKE 1 TABLET (20 MG TOTAL) BY MOUTH DAILY. FOR HEARTBURN., Disp: 90 tablet, Rfl: 2   Multiple Vitamins-Minerals (CENTRUM SILVER ADULT 50+) TABS, Take 1 tablet by mouth daily., Disp: , Rfl:

## 2024-08-11 NOTE — Research (Addendum)
 "                                   Chemistry: Glucose 119  mg/dL                                                     [] Clinically Significant  [x] Not Clinically Significant A1c 6.6                                                                         [] Clinically Significant  [x] Not Clinically Significant Bicarb 18.8 mEq/L                                                       [] Clinically Significant  [x] Not Clinically Significant Alk Phos 146 U/L                                                         [] Clinically Significant  [x] Not Clinically Significant Basophils 2.8                                                               [] Clinically Significant  [x] Not Clinically Significant   Lipids:  Apo B 40.0 mg/dL                                                        [] Clinically Significant  [x] Not Clinically Significant  Cholest 118 mg/dL                                                       [] Clinically Significant  [x] Not Clinically Significant                     HDLC4 62 mg/dL                                                         [] Clinically Significant  [x] Not Clinically Significant  Any further action needed  to be taken per the PI? No  Vinie KYM Maxcy, MD, [facility], FNLA, FACP  [place]  [facility]  Medical Director of the Advanced Lipid Disorders &  Cardiovascular Risk Reduction Clinic Diplomate of the American Board of Clinical Lipidology Attending Cardiologist  Direct Dial: [phone]  Fax: [phone]  Website:  [email or web address]  "

## 2024-09-05 ENCOUNTER — Encounter

## 2024-10-21 ENCOUNTER — Ambulatory Visit: Admitting: Medical
